# Patient Record
Sex: Female | Born: 1950
Health system: Southern US, Community
[De-identification: ages and names within clinical notes are randomized; demographics above are authoritative.]

## PROBLEM LIST (undated history)

## (undated) DIAGNOSIS — R87619 Unspecified abnormal cytological findings in specimens from cervix uteri: Secondary | ICD-10-CM

## (undated) DIAGNOSIS — R011 Cardiac murmur, unspecified: Secondary | ICD-10-CM

## (undated) DIAGNOSIS — M25552 Pain in left hip: Secondary | ICD-10-CM

## (undated) DIAGNOSIS — K219 Gastro-esophageal reflux disease without esophagitis: Secondary | ICD-10-CM

## (undated) DIAGNOSIS — E7800 Pure hypercholesterolemia, unspecified: Secondary | ICD-10-CM

## (undated) DIAGNOSIS — E079 Disorder of thyroid, unspecified: Secondary | ICD-10-CM

## (undated) DIAGNOSIS — B009 Herpesviral infection, unspecified: Secondary | ICD-10-CM

## (undated) DIAGNOSIS — G43909 Migraine, unspecified, not intractable, without status migrainosus: Secondary | ICD-10-CM

## (undated) DIAGNOSIS — G894 Chronic pain syndrome: Secondary | ICD-10-CM

## (undated) DIAGNOSIS — I493 Ventricular premature depolarization: Secondary | ICD-10-CM

## (undated) DIAGNOSIS — IMO0002 Reserved for concepts with insufficient information to code with codable children: Secondary | ICD-10-CM

## (undated) DIAGNOSIS — M810 Age-related osteoporosis without current pathological fracture: Secondary | ICD-10-CM

## (undated) DIAGNOSIS — M8580 Other specified disorders of bone density and structure, unspecified site: Secondary | ICD-10-CM

## (undated) HISTORY — PX: APPENDECTOMY: SHX54

## (undated) HISTORY — DX: Pure hypercholesterolemia, unspecified: E78.00

## (undated) HISTORY — DX: Unspecified abnormal cytological findings in specimens from cervix uteri: R87.619

## (undated) HISTORY — DX: Gastro-esophageal reflux disease without esophagitis: K21.9

## (undated) HISTORY — DX: Age-related osteoporosis without current pathological fracture: M81.0

## (undated) HISTORY — PX: FOOT SURGERY: SHX648

## (undated) HISTORY — DX: Reserved for concepts with insufficient information to code with codable children: IMO0002

## (undated) HISTORY — DX: Herpesviral infection, unspecified: B00.9

## (undated) HISTORY — DX: Migraine, unspecified, not intractable, without status migrainosus: G43.909

## (undated) HISTORY — DX: Disorder of thyroid, unspecified: E07.9

## (undated) HISTORY — DX: Ventricular premature depolarization: I49.3

## (undated) HISTORY — DX: Chronic pain syndrome: G89.4

## (undated) HISTORY — DX: Other specified disorders of bone density and structure, unspecified site: M85.80

## (undated) HISTORY — DX: Pain in left hip: M25.552

## (undated) HISTORY — PX: WRIST FRACTURE SURGERY: SHX121

## (undated) HISTORY — DX: Cardiac murmur, unspecified: R01.1

---

## 1997-10-11 ENCOUNTER — Other Ambulatory Visit: Admission: RE | Admit: 1997-10-11 | Discharge: 1997-10-11 | Payer: Self-pay | Admitting: Obstetrics and Gynecology

## 1998-12-12 ENCOUNTER — Other Ambulatory Visit: Admission: RE | Admit: 1998-12-12 | Discharge: 1998-12-12 | Payer: Self-pay | Admitting: Family Medicine

## 2003-05-17 ENCOUNTER — Other Ambulatory Visit: Admission: RE | Admit: 2003-05-17 | Discharge: 2003-05-17 | Payer: Self-pay | Admitting: Obstetrics and Gynecology

## 2004-08-15 ENCOUNTER — Other Ambulatory Visit: Admission: RE | Admit: 2004-08-15 | Discharge: 2004-08-15 | Payer: Self-pay | Admitting: Obstetrics and Gynecology

## 2012-04-08 ENCOUNTER — Ambulatory Visit: Payer: BC Managed Care – PPO | Admitting: Obstetrics and Gynecology

## 2012-04-08 ENCOUNTER — Encounter: Payer: Self-pay | Admitting: Obstetrics and Gynecology

## 2012-04-08 VITALS — BP 112/74 | Ht 64.0 in | Wt 116.0 lb

## 2012-04-08 DIAGNOSIS — Z124 Encounter for screening for malignant neoplasm of cervix: Secondary | ICD-10-CM

## 2012-04-08 DIAGNOSIS — Z01419 Encounter for gynecological examination (general) (routine) without abnormal findings: Secondary | ICD-10-CM

## 2012-04-08 DIAGNOSIS — A6000 Herpesviral infection of urogenital system, unspecified: Secondary | ICD-10-CM

## 2012-04-08 DIAGNOSIS — N952 Postmenopausal atrophic vaginitis: Secondary | ICD-10-CM

## 2012-04-08 MED ORDER — ESTRADIOL 0.1 MG/GM VA CREA: TOPICAL_CREAM | VAGINAL | Status: DC

## 2012-04-08 NOTE — Patient Instructions (Addendum)
Patient to ask physician about Hep B, HIV, and CBC. OTC med for skin itching: Caladryl

## 2012-04-08 NOTE — Progress Notes (Signed)
Last Pap: 2 years ago  WNL: Yes Regular Periods:no Contraception: PM   Monthly Breast exam:yes Tetanus<28yrs:yes Nl.Bladder Function:yes Daily BMs:yes Healthy Diet:yes Calcium:yes Mammogram:yes Date of Mammogram: fall 2013 Exercise:yes Have often Exercise: walk about a mile a day.  Seatbelt: yes Abuse at home: no Stressful work:no Sigmoid-colonoscopy: 4+ years ago per pt wnl  Bone Density: Yes 5+ years per pt  PCP: Dr.Bozeman C/o of herpes outbreaks .   Subjective:    Kristine Stafford is a 62 y.o. female G0P0 who presents for annual exam. Has hx of vulvar "outbreak" more than a month ago, then noted pruritic eruption over left lower abdomen over about 10 cm area  The following portions of the patient's history were reviewed and updated as appropriate: allergies, current medications, past family history, past medical history, past social history, past surgical history and problem list.  Review of Systems Pertinent items are noted in HPI. Gastrointestinal:No change in bowel habits, no abdominal pain, no rectal bleeding Genitourinary:negative for dysuria, frequency, hematuria, nocturia and urinary incontinence    Objective:     BP 112/74  Ht 5\' 4"  (1.626 m)  Wt 116 lb (52.617 kg)  BMI 19.9 kg/m2  Weight:  Wt Readings from Last 1 Encounters:  04/08/12 116 lb (52.617 kg)     BMI: Body mass index is 19.9 kg/(m^2). General Appearance: Alert, appropriate appearance for age. No acute distress HEENT: Grossly normal Neck / Thyroid: Supple, no masses, nodes or enlargement Lungs: clear to auscultation bilaterally Back: No CVA tenderness Breast Exam: No masses or nodes.No dimpling, nipple retraction or discharge. Cardiovascular: Regular rate and rhythm. S1, S2, no murmur Gastrointestinal: Soft, non-tender, no masses or organomegaly.  Erythematous patches of slightly raised non vesicular eruptions in various stages of healing after apparent scratching Pelvic Exam: External  genitalia: 8mm area on left posterior vulva at 5 o'clock position with minimal pigment change that pt notes as area of HSV outbreak Vaginal: atrophic mucosa Cervix: normal appearance and atrophic mucosa Adnexa: no masses noted Uterus: normal single, nontender Rectovaginal: normal rectal, no masses Lymphatic Exam: Non-palpable nodes in neck, clavicular, axillary, or inguinal regions Skin: no rash or abnormalities Neurologic: Normal gait and speech, no tremor  Psychiatric: Alert and oriented, appropriate affect.    Urinalysis:Not done    Assessment:    hx HSV  Abdominal eruption, doubt HSV, more consistent with dermatitis Atrophic vaginal sx Dyspareunia   Plan:  We had a prolonged discussion about these complex clinical issues and went over the various important aspects to consider. All questions were answered. Pt decided she would take the recommendation of continuing the higher dose Valtrex she is currently using for 2 weeks.  If eruption continues then, will see dermatologist. Estradiol vaginal cream mammogram pap smear Return 6wks

## 2012-04-09 LAB — PAP IG AND HPV HIGH-RISK: HPV DNA High Risk: NOT DETECTED

## 2012-12-17 ENCOUNTER — Other Ambulatory Visit: Payer: Self-pay

## 2013-11-26 ENCOUNTER — Other Ambulatory Visit: Payer: Self-pay

## 2014-02-21 ENCOUNTER — Ambulatory Visit (INDEPENDENT_AMBULATORY_CARE_PROVIDER_SITE_OTHER): Payer: Medicare Other | Admitting: Internal Medicine

## 2014-02-21 VITALS — BP 122/70 | HR 85 | Temp 98.5°F | Resp 18 | Ht 64.0 in | Wt 118.0 lb

## 2014-02-21 DIAGNOSIS — Z77018 Contact with and (suspected) exposure to other hazardous metals: Secondary | ICD-10-CM

## 2014-02-21 NOTE — Progress Notes (Signed)
Subjective:  This chart was scribed for Leandrew Koyanagi, MD by Ladene Artist, ED Scribe. The patient was seen in room 1. Patient's care was started at 9:52 AM.   Patient ID: Kristine Stafford, female    DOB: Oct 25, 1950, 64 y.o.   MRN: [id number]  Chief Complaint  Patient presents with  . Advice Only    pcp   HPI HPI Comments: Kristine Stafford is a 64 y.o. female, with a h/o HSV 2, cervical osteopenia, osteoporosis, thyroid disease, GERD, who presents to the [facility] for a consultation. Pt states that she wishes to establish care with Dr. Laney Pastor at this time. Pt states that she plans to have a DSMA test via [facility] within the next few weeks to see how much Mercury she has in her body(dental amalgam expos)in prep for filling removals. She denies any other symptoms at this time. Has worked with [employer] in past.  H camp-sis//[employer]  Past Medical History  Diagnosis Date  . Abnormal Pap smear   . Herpes simplex without mention of complication     HSV 2   . Osteopenia     in neck  . PVC (premature ventricular contraction)   . Migraine   . GERD (gastroesophageal reflux disease)   . Heart murmur   . Osteoporosis   . Thyroid disease    Current Outpatient Prescriptions on File Prior to Visit  Medication Sig Dispense Refill  . Ascorbic Acid (VITAMIN C) 1000 MG tablet Take 1,000 mg by mouth 3 (three) times daily.    Marland Kitchen estradiol (ESTRACE) 0.1 MG/GM vaginal cream 1 gm per vagina 2 times weekly for 4 weeks, then once weekly (Patient not taking: Reported on 02/21/2014) 42.5 g 12  . LYSINE PO Take by mouth.    . Multiple Vitamin (MULTIVITAMIN) capsule Take 1 capsule by mouth daily.    . Throat Lozenges (IMMUPLEX LOZENGE MT) Use as directed in the mouth or throat.    . valACYclovir (VALTREX) 500 MG tablet Take 500 mg by mouth 2 (two) times daily.    . vitamin E 200 UNIT capsule Take 200 Units by mouth daily.     No current facility-administered medications  on file prior to visit.   Allergies  Allergen Reactions  . Eggs Or Egg-Derived Products   . Other     Oats,poppyseeds,yeast,baker and brewers.   . Penicillins   . Tetracyclines & Related    Review of Systems  Constitutional: Negative for fever, chills and fatigue.  Respiratory: Negative for cough and shortness of breath.   Cardiovascular: Negative for chest pain.  Gastrointestinal: Negative for abdominal pain.  Neurological: Negative for dizziness, syncope, weakness and headaches.      Objective:   Physical Exam  Constitutional: She is oriented to person, place, and time. She appears well-developed and well-nourished. No distress.  HENT:  Head: Normocephalic and atraumatic.  Eyes: Conjunctivae and EOM are normal.  Cardiovascular: Normal rate.   Pulmonary/Chest: Effort normal.  Musculoskeletal: Normal range of motion.  Neurological: She is alert and oriented to person, place, and time.  Skin: Skin is warm and dry.  Psychiatric: She has a normal mood and affect. Her behavior is normal.  Nursing note and vitals reviewed. BP 122/70 mmHg  Pulse 85  Temp(Src) 98.5 F (36.9 C) (Oral)  Resp 18  Ht 5\' 4"  (1.626 m)  Wt 118 lb (53.524 kg)  BMI 20.24 kg/m2  SpO2 96%    Assessment & Plan:  I have completed the patient encounter in its entirety as documented by the scribe, with editing by me where necessary. Robert P. Laney Pastor, M.D.  Exposure to mercury  F/u mychart Med init visit by appt

## 2014-03-23 ENCOUNTER — Encounter: Payer: Self-pay | Admitting: Internal Medicine

## 2014-07-08 ENCOUNTER — Encounter: Payer: Self-pay | Admitting: Internal Medicine

## 2014-07-12 ENCOUNTER — Encounter: Payer: Self-pay | Admitting: Internal Medicine

## 2014-07-20 ENCOUNTER — Encounter: Payer: Self-pay | Admitting: Internal Medicine

## 2014-07-26 ENCOUNTER — Encounter: Payer: Self-pay | Admitting: Internal Medicine

## 2014-08-08 ENCOUNTER — Other Ambulatory Visit: Payer: Self-pay

## 2015-02-02 ENCOUNTER — Ambulatory Visit: Payer: Medicare Other | Admitting: Physician Assistant

## 2015-09-08 ENCOUNTER — Ambulatory Visit (INDEPENDENT_AMBULATORY_CARE_PROVIDER_SITE_OTHER): Payer: Medicare Other | Admitting: Family Medicine

## 2015-09-08 DIAGNOSIS — S80861A Insect bite (nonvenomous), right lower leg, initial encounter: Secondary | ICD-10-CM

## 2015-09-08 DIAGNOSIS — W57XXXA Bitten or stung by nonvenomous insect and other nonvenomous arthropods, initial encounter: Secondary | ICD-10-CM | POA: Insufficient documentation

## 2015-09-08 DIAGNOSIS — S80862A Insect bite (nonvenomous), left lower leg, initial encounter: Secondary | ICD-10-CM | POA: Diagnosis not present

## 2015-09-08 MED ORDER — TRIAMCINOLONE ACETONIDE 0.1 % EX CREA: 1.0000 "application " | TOPICAL_CREAM | Freq: Two times a day (BID) | CUTANEOUS | 0 refills | Status: DC

## 2015-09-08 MED ORDER — DOXYCYCLINE HYCLATE 100 MG PO TABS: 100.0000 mg | ORAL_TABLET | Freq: Two times a day (BID) | ORAL | 0 refills | Status: AC

## 2015-09-08 NOTE — Patient Instructions (Addendum)
Thank you for coming in today. Take doxycyline twice daily for 1 week.  Do not take with calcium, iron or a multi-vitamin.  Use the cream as needed.  Return as needed.  Doxycycline can make it easier to get a sunburn so cover up and use sun-screen.  If you get allergic reaction to doxycyline stop it.    Tick Bite Information Ticks are insects that attach themselves to the skin and draw blood for food. There are various types of ticks. Common types include wood ticks and deer ticks. Most ticks live in shrubs and grassy areas. Ticks can climb onto your body when you make contact with leaves or grass where the tick is waiting. The most common places on the body for ticks to attach themselves are the scalp, neck, armpits, waist, and groin. Most tick bites are harmless, but sometimes ticks carry germs that cause diseases. These germs can be spread to a person during the tick's feeding process. The chance of a disease spreading through a tick bite depends on:   The type of tick.  Time of year.   How long the tick is attached.   Geographic location.  HOW CAN YOU PREVENT TICK BITES? Take these steps to help prevent tick bites when you are outdoors:  Wear protective clothing. Long sleeves and long pants are best.   Wear white clothes so you can see ticks more easily.  Tuck your pant legs into your socks.   If walking on a trail, stay in the middle of the trail to avoid brushing against bushes.  Avoid walking through areas with long grass.  Put insect repellent on all exposed skin and along boot tops, pant legs, and sleeve cuffs.   Check clothing, hair, and skin repeatedly and before going inside.   Brush off any ticks that are not attached.  Take a shower or bath as soon as possible after being outdoors.  WHAT IS THE PROPER WAY TO REMOVE A TICK? Ticks should be removed as soon as possible to help prevent diseases caused by tick bites. 1. If latex gloves are available, put  them on before trying to remove a tick.  2. Using fine-point tweezers, grasp the tick as close to the skin as possible. You may also use curved forceps or a tick removal tool. Grasp the tick as close to its head as possible. Avoid grasping the tick on its body. 3. Pull gently with steady upward pressure until the tick lets go. Do not twist the tick or jerk it suddenly. This may break off the tick's head or mouth parts. 4. Do not squeeze or crush the tick's body. This could force disease-carrying fluids from the tick into your body.  5. After the tick is removed, wash the bite area and your hands with soap and water or other disinfectant such as alcohol. 6. Apply a small amount of antiseptic cream or ointment to the bite site.  7. Wash and disinfect any instruments that were used.  Do not try to remove a tick by applying a hot match, petroleum jelly, or fingernail polish to the tick. These methods do not work and may increase the chances of disease being spread from the tick bite.  WHEN SHOULD YOU SEEK MEDICAL CARE? Contact your health care provider if you are unable to remove a tick from your skin or if a part of the tick breaks off and is stuck in the skin.  After a tick bite, you need to be aware  of signs and symptoms that could be related to diseases spread by ticks. Contact your health care provider if you develop any of the following in the days or weeks after the tick bite:  Unexplained fever.  Rash. A circular rash that appears days or weeks after the tick bite may indicate the possibility of Lyme disease. The rash may resemble a target with a bull's-eye and may occur at a different part of your body than the tick bite.  Redness and swelling in the area of the tick bite.   Tender, swollen lymph glands.   Diarrhea.   Weight loss.   Cough.   Fatigue.   Muscle, joint, or bone pain.   Abdominal pain.   Headache.   Lethargy or a change in your level of  consciousness.  Difficulty walking or moving your legs.   Numbness in the legs.   Paralysis.  Shortness of breath.   Confusion.   Repeated vomiting.    This information is not intended to replace advice given to you by your health care provider. Make sure you discuss any questions you have with your health care provider.   Document Released: 01/26/2000 Document Revised: 02/18/2014 Document Reviewed: 07/08/2012 Elsevier Interactive Patient Education 2016 [employer].     IF you received an x-ray today, you will receive an invoice from [facility]. Please contact [facility] at [phone] with questions or concerns regarding your invoice.   IF you received labwork today, you will receive an invoice from [employer]. Please contact [facility] at [phone] with questions or concerns regarding your invoice.   Our billing staff will not be able to assist you with questions regarding bills from these companies.  You will be contacted with the lab results as soon as they are available. The fastest way to get your results is to activate your My Chart account. Instructions are located on the last page of this paperwork. If you have not heard from [place] regarding the results in 2 weeks, please contact this office.     We recommend that you schedule a mammogram for breast cancer screening. Typically, you do not need a referral to do this. Please contact a local imaging center to schedule your mammogram.  [facility] - [phone]  *ask for the Radiology Department [facility]) - [phone] or [phone]  [facility] - [phone] [place] [phone] [facility] - [phone]  *ask for the [facility] - [phone]  *ask for the Radiology Department [facility] - [phone]  *ask for the [facility] - [phone]

## 2015-09-08 NOTE — Progress Notes (Signed)
    Kristine Stafford is a 65 y.o. female who presents to [facility] today for multiple tick bites. Patient was bitten by multiple small ticks few days ago. There were tiny almost microscopic dots. She pulled many of her legs. She notes multiple erythematous papules. She estimates the ticks were on her skin for about 12 hours.   She notes a drug allergy to tetracycline. She describes nausea. She notes she's been able to take doxycycline in the past.   Past Medical History:  Diagnosis Date  . Abnormal Pap smear   . GERD (gastroesophageal reflux disease)   . Heart murmur   . Herpes simplex without mention of complication    HSV 2   . Migraine   . Osteopenia    in neck  . Osteoporosis   . PVC (premature ventricular contraction)   . Thyroid disease    Past Surgical History:  Procedure Laterality Date  . APPENDECTOMY     Social History  Substance Use Topics  . Smoking status: Never Smoker  . Smokeless tobacco: Never Used  . Alcohol use Yes   ROS as above Medications: Current Outpatient Prescriptions  Medication Sig Dispense Refill  . Ascorbic Acid (VITAMIN C) 1000 MG tablet Take 1,000 mg by mouth 3 (three) times daily.    Marland Kitchen LYSINE PO Take by mouth.    . Multiple Vitamin (MULTIVITAMIN) capsule Take 1 capsule by mouth daily.    . vitamin E 200 UNIT capsule Take 200 Units by mouth daily.    Marland Kitchen doxycycline (VIBRA-TABS) 100 MG tablet Take 1 tablet (100 mg total) by mouth 2 (two) times daily. 14 tablet 0  . triamcinolone cream (KENALOG) 0.1 % Apply 1 application topically 2 (two) times daily. 453.6 g 0   No current facility-administered medications for this visit.    Allergies  Allergen Reactions  . Eggs Or Egg-Derived Products   . Other     Oats,poppyseeds,yeast,baker and brewers.   . Penicillins   . Tetracyclines & Related      Exam:  BP 118/72 (BP Location: Left Arm, Patient Position: Sitting, Cuff Size: Small)   Pulse 86   Temp 98.1 F (36.7 C) (Oral)   Resp 16   Ht 5'  4" (1.626 m)   Wt 119 lb 3.2 oz (54.1 kg)   SpO2 98%   BMI 20.46 kg/m  Gen: Well NAD Skin: Multiple small erythematous papules on lower extremities. No visible embedded ticks   No results found for this or any previous visit (from the past 24 hour(s)). No results found.  Assessment and Plan: 66 y.o. female with multiple tick bites. Treat empirically with doxycycline and triamcinolone cream. I feel safe to use doxycycline in the setting of a reported tetracycline allergy is a believe the allergy was a side effect to tetracycline and not a true allergy. We had a discussion about warning signs and symptoms of drug allergies.  Return as needed.  Discussed warning signs or symptoms. Please see discharge instructions. Patient expresses understanding.

## 2017-10-08 ENCOUNTER — Other Ambulatory Visit: Payer: Self-pay | Admitting: Family Medicine

## 2017-10-08 DIAGNOSIS — R5381 Other malaise: Secondary | ICD-10-CM

## 2017-10-24 ENCOUNTER — Other Ambulatory Visit: Payer: Self-pay | Admitting: Family Medicine

## 2017-10-24 DIAGNOSIS — E2839 Other primary ovarian failure: Secondary | ICD-10-CM

## 2017-12-25 ENCOUNTER — Ambulatory Visit
Admission: RE | Admit: 2017-12-25 | Discharge: 2017-12-25 | Disposition: A | Discharge status: Home / Self Care | Payer: Medicare Other | Source: Ambulatory Visit | Attending: Family Medicine | Admitting: Family Medicine

## 2017-12-25 DIAGNOSIS — E2839 Other primary ovarian failure: Secondary | ICD-10-CM

## 2020-06-05 DIAGNOSIS — Z1159 Encounter for screening for other viral diseases: Secondary | ICD-10-CM | POA: Diagnosis not present

## 2020-06-05 DIAGNOSIS — M810 Age-related osteoporosis without current pathological fracture: Secondary | ICD-10-CM | POA: Diagnosis not present

## 2020-06-05 DIAGNOSIS — Z13220 Encounter for screening for lipoid disorders: Secondary | ICD-10-CM | POA: Diagnosis not present

## 2020-06-05 DIAGNOSIS — R413 Other amnesia: Secondary | ICD-10-CM | POA: Diagnosis not present

## 2020-06-05 DIAGNOSIS — Z0000 Encounter for general adult medical examination without abnormal findings: Secondary | ICD-10-CM | POA: Diagnosis not present

## 2020-06-05 DIAGNOSIS — Z131 Encounter for screening for diabetes mellitus: Secondary | ICD-10-CM | POA: Diagnosis not present

## 2020-06-05 DIAGNOSIS — Z23 Encounter for immunization: Secondary | ICD-10-CM | POA: Diagnosis not present

## 2020-06-05 DIAGNOSIS — Z1389 Encounter for screening for other disorder: Secondary | ICD-10-CM | POA: Diagnosis not present

## 2020-06-05 DIAGNOSIS — Z1211 Encounter for screening for malignant neoplasm of colon: Secondary | ICD-10-CM | POA: Diagnosis not present

## 2020-06-05 DIAGNOSIS — Z136 Encounter for screening for cardiovascular disorders: Secondary | ICD-10-CM | POA: Diagnosis not present

## 2020-06-13 ENCOUNTER — Encounter: Payer: Self-pay | Admitting: Neurology

## 2020-07-24 DIAGNOSIS — H25813 Combined forms of age-related cataract, bilateral: Secondary | ICD-10-CM | POA: Diagnosis not present

## 2020-07-24 DIAGNOSIS — H04123 Dry eye syndrome of bilateral lacrimal glands: Secondary | ICD-10-CM | POA: Diagnosis not present

## 2020-07-24 DIAGNOSIS — H353122 Nonexudative age-related macular degeneration, left eye, intermediate dry stage: Secondary | ICD-10-CM | POA: Diagnosis not present

## 2020-07-24 DIAGNOSIS — H353111 Nonexudative age-related macular degeneration, right eye, early dry stage: Secondary | ICD-10-CM | POA: Diagnosis not present

## 2020-08-11 DIAGNOSIS — Z780 Asymptomatic menopausal state: Secondary | ICD-10-CM | POA: Diagnosis not present

## 2020-08-11 DIAGNOSIS — Z1231 Encounter for screening mammogram for malignant neoplasm of breast: Secondary | ICD-10-CM | POA: Diagnosis not present

## 2020-08-11 DIAGNOSIS — M810 Age-related osteoporosis without current pathological fracture: Secondary | ICD-10-CM | POA: Diagnosis not present

## 2020-09-05 ENCOUNTER — Other Ambulatory Visit (INDEPENDENT_AMBULATORY_CARE_PROVIDER_SITE_OTHER): Payer: Medicare PPO

## 2020-09-05 ENCOUNTER — Other Ambulatory Visit: Payer: Self-pay

## 2020-09-05 ENCOUNTER — Encounter: Payer: Self-pay | Admitting: Physician Assistant

## 2020-09-05 ENCOUNTER — Ambulatory Visit: Payer: Medicare PPO | Admitting: Physician Assistant

## 2020-09-05 VITALS — BP 115/76 | HR 91 | Ht 64.0 in | Wt 120.2 lb

## 2020-09-05 DIAGNOSIS — R413 Other amnesia: Secondary | ICD-10-CM

## 2020-09-05 LAB — VITAMIN B12: Vitamin B-12: 228 pg/mL (ref 211–911)

## 2020-09-05 LAB — TSH: TSH: 3.44 u[IU]/mL (ref 0.35–5.50)

## 2020-09-05 NOTE — Progress Notes (Signed)
Assessment/Plan:   Kristine Stafford is a 70 y.o. year old female with risk factors including migraines, hypothyroidism, hyperlipidemia, hypertension seen today for evaluation of memory loss. MoCA today is 15/30 = MMSE  with deficiencies in memory, language, abstraction, delayed recall  0/5, orientation  4/6    Recommendations:   Memory Loss   MRI brain with/without contrast to assess for underlying structural abnormality and assess vascular load  Neurocognitive testing to further evaluate cognitive concerns and determine underlying cause of memory changes, including potential contribution from sleep, anxiety, or depression  Check B12, TSH Discussed safety both in and out of the home.  Discussed the importance of regular daily schedule with inclusion of crossword puzzles to maintain brain function.  Continue to monitor mood with PCP.  Stay active at least 30 minutes at least 3 times a week.  Naps should be scheduled and should be no longer than 60 minutes and should not occur after 2 PM.  Mediterranean diet is recommended  Folllow up once results above are available   Subjective:    The patient is seen in neurologic consultation at the request of Donald Prose, MD for the evaluation of memory.  The patient is accompanied by boyfriend [name] who supplements the history. She is a 70 y.o. year old female who has had memory issues for about  1 year where she has noticed worsening short term recall issues. [name] reports that she is increasingly asking the same questions and repeating the same stories. " She cannot retain new information". She lives with [name] for the last 40 years. He noticed increased hoarding old objects and she adds "this is clogging my life". "She appears more stressed than depressed and Covid pandemic adds to it "-he says. Denies irritability. Her sleep pattern is "erratic", goes to sleep late and wakes up during the night, but she is trying to change her schedule to go to sleep  at the same time. Denies acting out in dreams or sleepwalking. Denies hallucinations or paranoia. She is independent of dressing and bathing. [name] manages her medications because she has a tendency to forget. He also manages the bill because she has missed some payments and he does the driving after she could not remember how to get from the pharmacy to the grocery store. A few months back, she made a trip to [place] and took the wrong turn, so she voluntarily surrendered the driving. She cooks occasionally, denies leaving the stove or faucet on. Appetite is good and denies trouble swallowing.  Denies leaving objects in unusual places. Ambulates without difficulty without a cane or walker.  Denies headaches or  falls. She had injury to the head at home, with a folding table about 1 y ago, without LOC. No visit to the ER or imaging was reported. Denies double vision, dizziness, focal numbness or tingling, unilateral weakness or tremors. Denies urine incontinence or retention. Denies constipation or diarrhea.  Denies anosmia. Denies history of OSA, ETOH  or Tobacco. Family History Dad dementia 90s AD. College education. Former Licensed conveyancer.      Allergies  Allergen Reactions   Eggs Or Egg-Derived Products    Other     Oats,poppyseeds,yeast,baker and brewers.    Penicillins    Tetracyclines & Related     Current Outpatient Medications  Medication Instructions   alendronate (FOSAMAX) 70 mg, Oral, Weekly, Take with a full glass of water on an empty stomach.   calcium citrate-vitamin D (CITRACAL+D) 315-200 MG-UNIT tablet 1 tablet,  Oral, 2 times daily     VITALS:   Vitals:   09/05/20 1021  BP: 115/76  Pulse: 91  SpO2: 97%  Weight: 120 lb 3.2 oz (54.5 kg)  Height: '5\' 4"'$  (1.626 m)        PHYSICAL EXAM   HEENT:  Normocephalic, atraumatic. The mucous membranes are moist. The superficial temporal arteries are without ropiness or tenderness. Cardiovascular: Regular rate and rhythm. Lungs: Clear  to auscultation bilaterally. Neck: There are no carotid bruits noted bilaterally.  NEUROLOGICAL: Montreal Cognitive Assessment  09/05/2020  Visuospatial/ Executive (0/5) 3  Naming (0/3) 3  Attention: Read list of digits (0/2) 2  Attention: Read list of letters (0/1) 1  Attention: Serial 7 subtraction starting at 100 (0/3) 0  Language: Repeat phrase (0/2) 0  Language : Fluency (0/1) 1  Abstraction (0/2) 1  Delayed Recall (0/5) 0  Orientation (0/6) 4  Total 15  Adjusted Score (based on education) 15   No flowsheet data found.  No flowsheet data found.   Orientation:  Alert and oriented to person, place , not to time. No aphasia or dysarthria. Fund of knowledge is appropriate. Recent memory impaired and remote memory intact.  Attention and concentration are normal.  Able to name objects and repeat phrases. Delayed recall 0  /5 Cranial nerves: There is good facial symmetry. Extraocular muscles are intact and visual fields are full to confrontational testing. Speech is fluent and clear. Soft palate rises symmetrically and there is no tongue deviation. Hearing is intact to conversational tone. Tone: Tone is good throughout. Sensation: Sensation is intact to light touch and pinprick throughout. Vibration is intact at the bilateral big toe.There is no extinction with double simultaneous stimulation. There is no sensory dermatomal level identified. Coordination: The patient has no difficulty with RAM's or FNF bilaterally. Normal finger to nose  Motor: Strength is 5/5 in the bilateral upper and lower extremities. There is no pronator drift. There are no fasciculations noted. DTR's: Deep tendon reflexes are 2/4 at the bilateral biceps, triceps, brachioradialis, patella and achilles.  Plantar responses are downgoing bilaterally. Gait and Station: The patient is able to ambulate without difficulty.The patient is able to heel toe walk without any difficulty.The patient is able to ambulate in a tandem  fashion. The patient is able to stand in the Romberg position.     Thank you for allowing [place] the opportunity to participate in the care of this nice patient. Please do not hesitate to contact us for any questions or concerns.   Total time spent on today's visit was 60 minutes, including both face-to-face time and nonface-to-face time.  Time included that spent on review of records (prior notes available to me/labs/imaging if pertinent), discussing treatment and goals, answering patient's questions and coordinating care.  Cc:  Donald Prose, MD  Sharene Butters 09/05/2020 12:37 PM

## 2020-09-05 NOTE — Patient Instructions (Signed)
It was a pleasure to see you today at our office.   Recommendations:  Neurocognitive evaluation at our office MRI of the brain, the office will call you to arrange you appointment Check B12 and TSH at the lab Follow up once the results of the above are available   RECOMMENDATIONS FOR ALL PATIENTS WITH MEMORY PROBLEMS: 1. Continue to exercise (Recommend 30 minutes of walking everyday, or 3 hours every week) 2. Increase social interactions - continue going to [place] and enjoy social gatherings with friends and family 3. Eat healthy, avoid fried foods and eat more fruits and vegetables 4. Maintain adequate blood pressure, blood sugar, and blood cholesterol level. Reducing the risk of stroke and cardiovascular disease also helps promoting better memory. 5. Avoid stressful situations. Live a simple life and avoid aggravations. Organize your time and prepare for the next day in anticipation. 6. Sleep well, avoid any interruptions of sleep and avoid any distractions in the bedroom that may interfere with adequate sleep quality 7. Avoid sugar, avoid sweets as there is a strong link between excessive sugar intake, diabetes, and cognitive impairment We discussed the Mediterranean diet, which has been shown to help patients reduce the risk of progressive memory disorders and reduces cardiovascular risk. This includes eating fish, eat fruits and green leafy vegetables, nuts like almonds and hazelnuts, walnuts, and also use olive oil. Avoid fast foods and fried foods as much as possible. Avoid sweets and sugar as sugar use has been linked to worsening of memory function.  There is always a concern of gradual progression of memory problems. If this is the case, then we may need to adjust level of care according to patient needs. Support, both to the patient and caregiver, should then be put into place.      You have been referred for a neuropsychological evaluation (i.e., evaluation of memory and thinking  abilities). Please bring someone with you to this appointment if possible, as it is helpful for the doctor to hear from both you and another adult who knows you well. Please bring eyeglasses and hearing aids if you wear them.    The evaluation will take approximately 3 hours and has two parts:   The first part is a clinical interview with the neuropsychologist (Dr. Melvyn Novas or Dr. Nicole Kindred). During the interview, the neuropsychologist will speak with you and the individual you brought to the appointment.    The second part of the evaluation is testing with the doctor's technician Hinton Dyer or Maudie Mercury). During the testing, the technician will ask you to remember different types of material, solve problems, and answer some questionnaires. Your family member will not be present for this portion of the evaluation.   Please note: We must reserve several hours of the neuropsychologist's time and the psychometrician's time for your evaluation appointment. As such, there is a No-Show fee of $100. If you are unable to attend any of your appointments, please contact our office as soon as possible to reschedule.    FALL PRECAUTIONS: Be cautious when walking. Scan the area for obstacles that may increase the risk of trips and falls. When getting up in the mornings, sit up at the edge of the bed for a few minutes before getting out of bed. Consider elevating the bed at the head end to avoid drop of blood pressure when getting up. Walk always in a well-lit room (use night lights in the walls). Avoid area rugs or power cords from appliances in the middle of the walkways.  Use a walker or a cane if necessary and consider physical therapy for balance exercise. Get your eyesight checked regularly.  FINANCIAL OVERSIGHT: Supervision, especially oversight when making financial decisions or transactions is also recommended.  HOME SAFETY: Consider the safety of the kitchen when operating appliances like stoves, microwave oven, and  blender. Consider having supervision and share cooking responsibilities until no longer able to participate in those. Accidents with firearms and other hazards in the house should be identified and addressed as well.   ABILITY TO BE LEFT ALONE: If patient is unable to contact 911 operator, consider using LifeLine, or when the need is there, arrange for someone to stay with patients. Smoking is a fire hazard, consider supervision or cessation. Risk of wandering should be assessed by caregiver and if detected at any point, supervision and safe proof recommendations should be instituted.  MEDICATION SUPERVISION: Inability to self-administer medication needs to be constantly addressed. Implement a mechanism to ensure safe administration of the medications.   DRIVING: Regarding driving, in patients with progressive memory problems, driving will be impaired. We advise to have someone else do the driving if trouble finding directions or if minor accidents are reported. Independent driving assessment is available to determine safety of driving.   If you are interested in the driving assessment, you can contact the following:  [employer] in [place]  [place] [phone] or [phone]    [place] refers to food and lifestyle choices that are based on the traditions of countries located on the [employer]. This way of eating has been shown to help prevent certain conditions and improve outcomes for people who have chronic diseases, like kidney disease and heart disease. What are tips for following this plan? Lifestyle  Cook and eat meals together with your family, when possible. Drink enough fluid to keep your urine clear or pale yellow. Be physically active every day. This includes: Aerobic exercise like running or swimming. Leisure  activities like gardening, walking, or housework. Get 7-8 hours of sleep each night. If recommended by your health care provider, drink red wine in moderation. This means 1 glass a day for nonpregnant women and 2 glasses a day for men. A glass of wine equals 5 oz (150 mL). Reading food labels  Check the serving size of packaged foods. For foods such as rice and pasta, the serving size refers to the amount of cooked product, not dry. Check the total fat in packaged foods. Avoid foods that have saturated fat or trans fats. Check the ingredients list for added sugars, such as corn syrup. Shopping  At the grocery store, buy most of your food from the areas near the walls of the store. This includes: Fresh fruits and vegetables (produce). Grains, beans, nuts, and seeds. Some of these may be available in unpackaged forms or large amounts (in bulk). Fresh seafood. Poultry and eggs. Low-fat dairy products. Buy whole ingredients instead of prepackaged foods. Buy fresh fruits and vegetables in-season from local farmers markets. Buy frozen fruits and vegetables in resealable bags. If you do not have access to quality fresh seafood, buy precooked frozen shrimp or canned fish, such as tuna, salmon, or sardines. Buy small amounts of raw or cooked vegetables, salads, or olives from the deli or salad bar at your store. Stock your pantry so you always have certain foods on hand, such as olive oil, canned tuna, canned tomatoes, rice,  pasta, and beans. Cooking  Cook foods with extra-virgin olive oil instead of using butter or other vegetable oils. Have meat as a side dish, and have vegetables or grains as your main dish. This means having meat in small portions or adding small amounts of meat to foods like pasta or stew. Use beans or vegetables instead of meat in common dishes like chili or lasagna. Experiment with different cooking methods. Try roasting or broiling vegetables instead of steaming or sauteing  them. Add frozen vegetables to soups, stews, pasta, or rice. Add nuts or seeds for added healthy fat at each meal. You can add these to yogurt, salads, or vegetable dishes. Marinate fish or vegetables using olive oil, lemon juice, garlic, and fresh herbs. Meal planning  Plan to eat 1 vegetarian meal one day each week. Try to work up to 2 vegetarian meals, if possible. Eat seafood 2 or more times a week. Have healthy snacks readily available, such as: Vegetable sticks with hummus. Greek yogurt. Fruit and nut trail mix. Eat balanced meals throughout the week. This includes: Fruit: 2-3 servings a day Vegetables: 4-5 servings a day Low-fat dairy: 2 servings a day Fish, poultry, or lean meat: 1 serving a day Beans and legumes: 2 or more servings a week Nuts and seeds: 1-2 servings a day Whole grains: 6-8 servings a day Extra-virgin olive oil: 3-4 servings a day Limit red meat and sweets to only a few servings a month What are my food choices? Mediterranean diet Recommended Grains: Whole-grain pasta. Brown rice. Bulgar wheat. Polenta. Couscous. Whole-wheat bread. [place]. Vegetables: Artichokes. Beets. Broccoli. Cabbage. Carrots. Eggplant. Green beans. Chard. Kale. Spinach. Onions. Leeks. Peas. Squash. Tomatoes. Peppers. Radishes. Fruits: Apples. Apricots. Avocado. Berries. Bananas. Cherries. Dates. Figs. Grapes. Lemons. Melon. Oranges. Peaches. Plums. Pomegranate. Meats and other protein foods: Beans. Almonds. Sunflower seeds. Pine nuts. Peanuts. [place]. Salmon. Scallops. Shrimp. [place]. Tilapia. Clams. Oysters. Eggs. Dairy: Low-fat milk. Cheese. Greek yogurt. Beverages: Water. Red wine. Herbal tea. Fats and oils: Extra virgin olive oil. Avocado oil. Grape seed oil. Sweets and desserts: [place] yogurt with honey. Baked apples. Poached pears. Trail mix. Seasoning and other foods: Basil. Cilantro. Coriander. Cumin. Mint. Parsley. Sage. Rosemary. Tarragon. Garlic. Oregano. Thyme. Pepper.  Balsalmic vinegar. Tahini. Hummus. Tomato sauce. Olives. Mushrooms. Limit these Grains: Prepackaged pasta or rice dishes. Prepackaged cereal with added sugar. Vegetables: Deep fried potatoes (french fries). Fruits: Fruit canned in syrup. Meats and other protein foods: Beef. Pork. Lamb. Poultry with skin. Hot dogs. Berniece Salines. Dairy: Ice cream. Sour cream. Whole milk. Beverages: Juice. Sugar-sweetened soft drinks. Beer. Liquor and spirits. Fats and oils: Butter. Canola oil. Vegetable oil. Beef fat (tallow). Lard. Sweets and desserts: Cookies. Cakes. Pies. Candy. Seasoning and other foods: Mayonnaise. Premade sauces and marinades. The items listed may not be a complete list. Talk with your dietitian about what dietary choices are right for you. Summary The Mediterranean diet includes both food and lifestyle choices. Eat a variety of fresh fruits and vegetables, beans, nuts, seeds, and whole grains. Limit the amount of red meat and sweets that you eat. Talk with your health care provider about whether it is safe for you to drink red wine in moderation. This means 1 glass a day for nonpregnant women and 2 glasses a day for men. A glass of wine equals 5 oz (150 mL). This information is not intended to replace advice given to you by your health care provider. Make sure you discuss any questions you have with your health care  provider. Document Released: 09/21/2015 Document Revised: 10/24/2015 Document Reviewed: 09/21/2015 Elsevier Interactive Patient Education  2017 [employer].

## 2020-09-06 ENCOUNTER — Telehealth: Payer: Self-pay

## 2020-09-06 NOTE — Telephone Encounter (Signed)
-----   Message from Rondel Jumbo, PA-C sent at 09/05/2020  2:58 PM EDT ----- Please inform pt that her thyroid lab is normal at 3.44 , and that her B12 is 228, we prefer the levels between 400 and 100. Recommend daily B12 , 1000 mcg a day to replenish. Thanks

## 2020-09-06 NOTE — Telephone Encounter (Signed)
Pt called no answer per DPR left a voice mail thyroid lab is normal at 3.44 , and that her B12 is 228, we prefer the levelsbetween 400 and 100. Recommend daily B12 , 1000 mcg a day to replenish

## 2020-09-20 ENCOUNTER — Ambulatory Visit
Admission: RE | Admit: 2020-09-20 | Discharge: 2020-09-20 | Disposition: A | Discharge status: Home / Self Care | Payer: Medicare PPO | Source: Ambulatory Visit | Attending: Physician Assistant | Admitting: Physician Assistant

## 2020-09-20 DIAGNOSIS — R413 Other amnesia: Secondary | ICD-10-CM | POA: Diagnosis not present

## 2020-09-20 IMAGING — MR MR HEAD W/O CM
10 series · 48 of 48 positions shown · non-contrast
Comparison: None.

CLINICAL DATA: Memory loss. Confusion and cognitive issues for 1
year

EXAM:
MRI HEAD WITHOUT CONTRAST
TECHNIQUE: Multiplanar, multiecho pulse sequences of the brain and surrounding
structures were obtained without intravenous contrast.

[Series 2: T1 · sagittal · 5.0mm · 0.45mm/px · 2 of 20 slices shown]
[im 1/20]
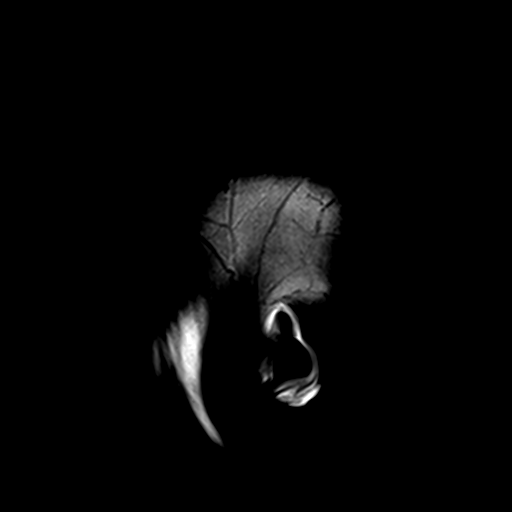
[im 20/20]
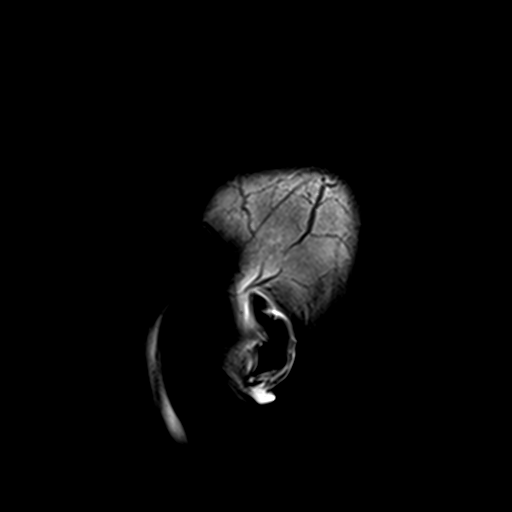

[Series 3: DWI · axial · 3.0mm · 1.80mm/px · z∈[-46,+99]mm · 9 of 98 slices shown (1 of 4)]
[im 1/98]
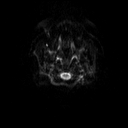
[im 13/98]
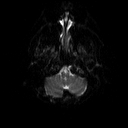
[im 25/98]
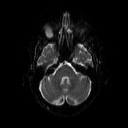
[im 37/98]
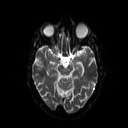
[im 49/98]
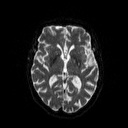
[im 61/98]
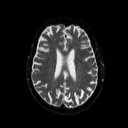
[im 73/98]
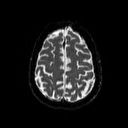
[im 85/98]
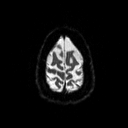
[im 98/98]
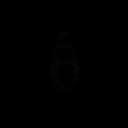

[Series 4: DWI · axial · 3.0mm · 1.80mm/px · z∈[-46,+99]mm · 4 of 48 slices shown (2 of 4)]
[im 1/48]
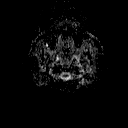
[im 16/48]
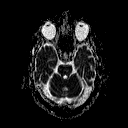
[im 32/48]
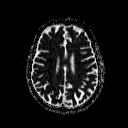
[im 48/48]
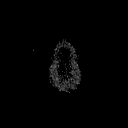

[Series 5: DWI · coronal · 5.0mm · 1.80mm/px · 6 of 72 slices shown (3 of 4)]
[im 1/72]
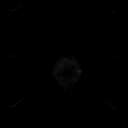
[im 15/72]
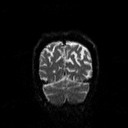
[im 29/72]
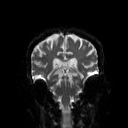
[im 43/72]
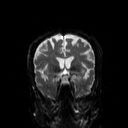
[im 57/72]
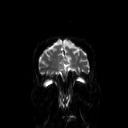
[im 72/72]
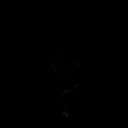

[Series 6: DWI · coronal · 5.0mm · 1.80mm/px · 3 of 34 slices shown (4 of 4)]
[im 1/34]
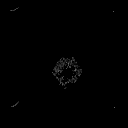
[im 17/34]
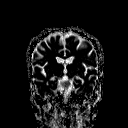
[im 34/34]
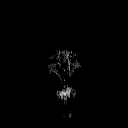

[Series 7: T2 · axial · 5.0mm · 0.60mm/px · z∈[-44,+102]mm · 2 of 22 slices shown (1 of 2)]
[im 1/22]
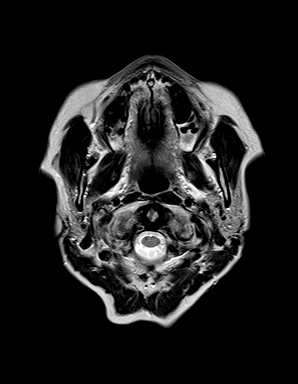
[im 22/22]
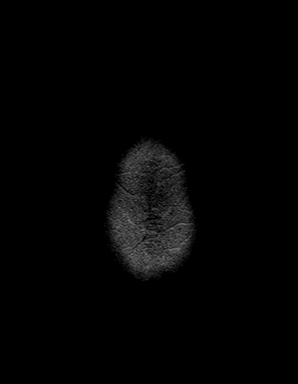

[Series 8: FLAIR · axial · 3.0mm · 0.45mm/px · z∈[-46,+101]mm · 3 of 33 slices shown]
[im 1/33]
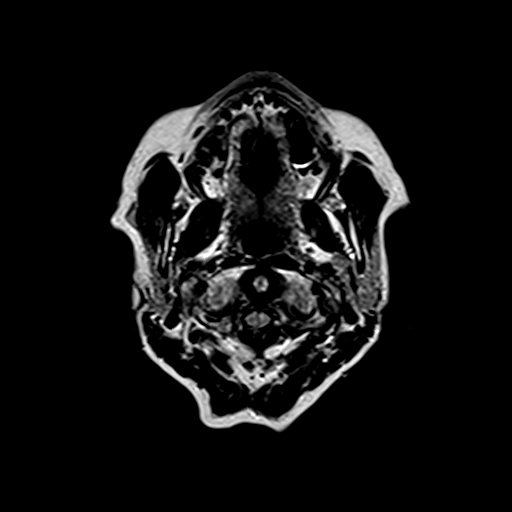
[im 17/33]
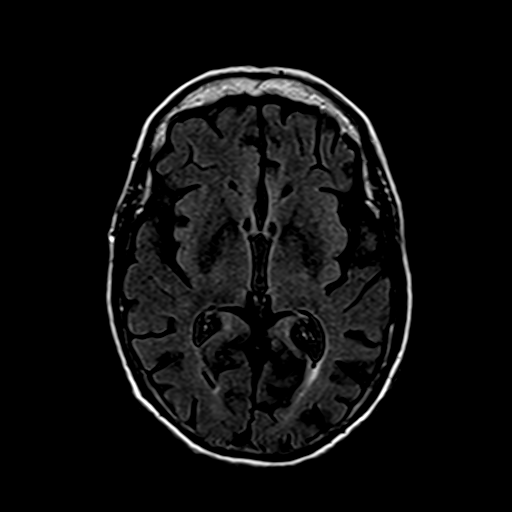
[im 33/33]
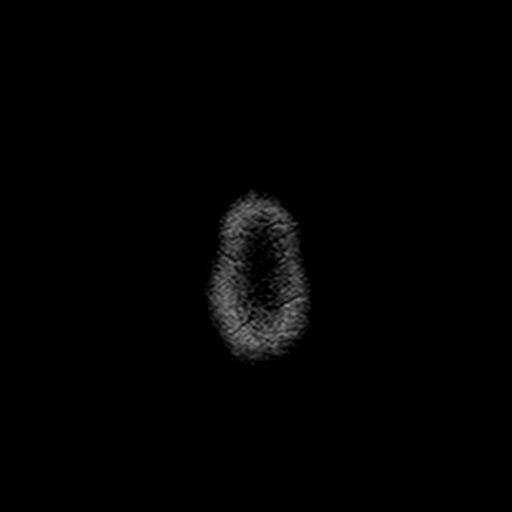

[Series 10: swi_images · axial · 4.0mm · 0.90mm/px · z∈[-49,+105]mm · 4 of 40 slices shown]
[im 1/40]
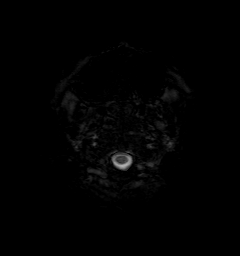
[im 14/40]
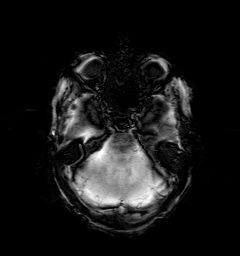
[im 27/40]
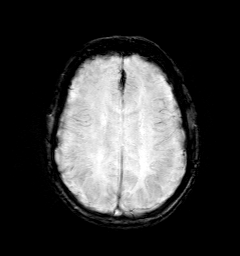
[im 40/40]
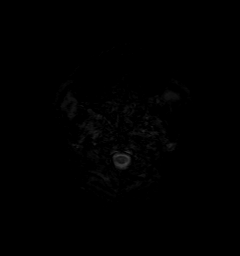

[Series 11: t1_mpr_tra · axial · 1.0mm · 0.71mm/px · z∈[-42,+100]mm · 13 of 144 slices shown]
[im 1/144]
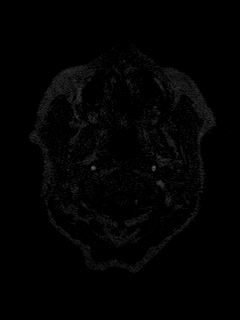
[im 12/144]
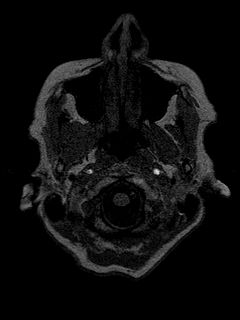
[im 24/144]
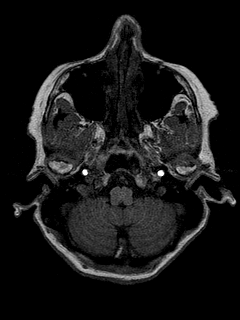
[im 36/144]
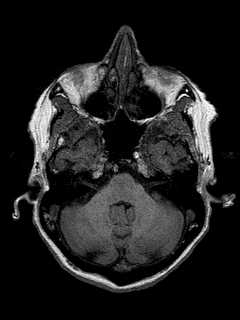
[im 48/144]
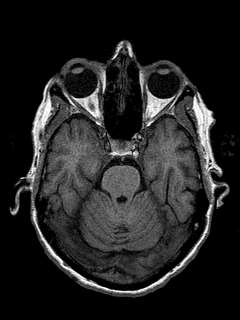
[im 60/144]
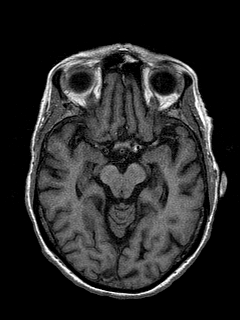
[im 72/144]
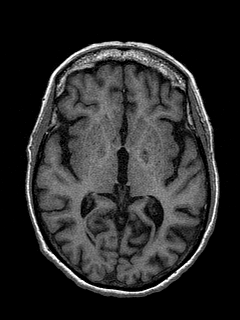
[im 84/144]
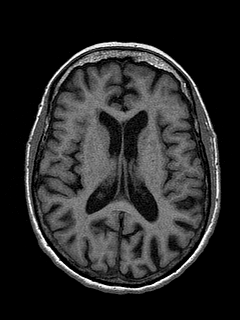
[im 96/144]
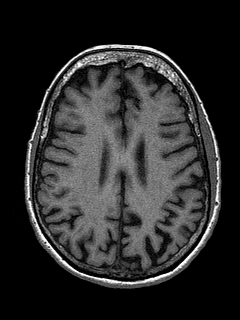
[im 108/144]
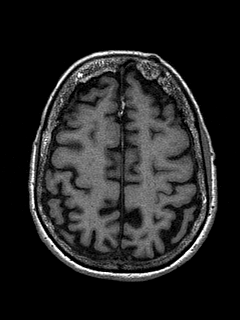
[im 120/144]
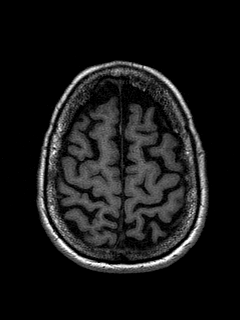
[im 132/144]
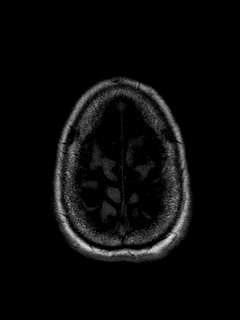
[im 144/144]
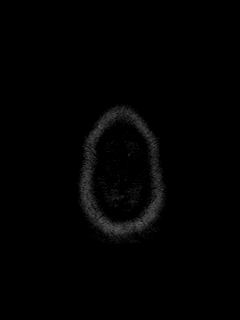

[Series 12: T2 · coronal · 5.0mm · 0.45mm/px · 2 of 27 slices shown (2 of 2)]
[im 1/27]
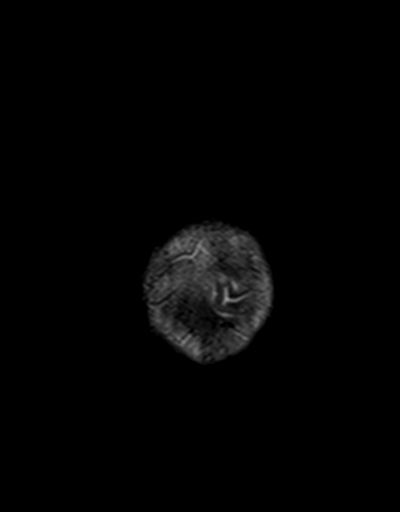
[im 27/27]
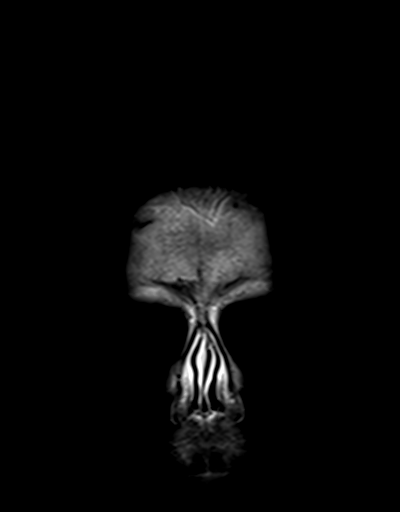

[48 of 48 positions shown; findings below may reference images not displayed]

FINDINGS: Brain: No recent infarction, hemorrhage, hydrocephalus, extra-axial
collection or mass lesion. Cerebral volume loss without specific
pattern. Preserved medial temporal volume. Less than typical white
matter chronic ischemic changes for age.

Vascular: Normal flow voids

Skull and upper cervical spine: Normal marrow signal. Left frontal
scalp osteoma.

Sinuses/Orbits: Unremarkable
IMPRESSION: 1. No reversible cause for symptoms.
2. Aging brain.

## 2020-09-21 NOTE — Progress Notes (Signed)
Patient advised and voiced understanding of MRI results.

## 2020-09-27 DIAGNOSIS — M545 Low back pain, unspecified: Secondary | ICD-10-CM | POA: Diagnosis not present

## 2020-09-27 DIAGNOSIS — M25551 Pain in right hip: Secondary | ICD-10-CM | POA: Diagnosis not present

## 2020-09-28 ENCOUNTER — Encounter: Payer: Medicare PPO | Admitting: Psychology

## 2020-10-02 DIAGNOSIS — M5416 Radiculopathy, lumbar region: Secondary | ICD-10-CM | POA: Diagnosis not present

## 2020-10-04 DIAGNOSIS — M5416 Radiculopathy, lumbar region: Secondary | ICD-10-CM | POA: Diagnosis not present

## 2020-10-11 DIAGNOSIS — M5416 Radiculopathy, lumbar region: Secondary | ICD-10-CM | POA: Diagnosis not present

## 2020-10-23 DIAGNOSIS — M5416 Radiculopathy, lumbar region: Secondary | ICD-10-CM | POA: Diagnosis not present

## 2020-10-26 DIAGNOSIS — E785 Hyperlipidemia, unspecified: Secondary | ICD-10-CM | POA: Diagnosis not present

## 2020-10-26 DIAGNOSIS — N852 Hypertrophy of uterus: Secondary | ICD-10-CM | POA: Diagnosis not present

## 2020-10-26 DIAGNOSIS — R39198 Other difficulties with micturition: Secondary | ICD-10-CM | POA: Diagnosis not present

## 2020-10-26 DIAGNOSIS — R309 Painful micturition, unspecified: Secondary | ICD-10-CM | POA: Diagnosis not present

## 2020-10-30 DIAGNOSIS — M5416 Radiculopathy, lumbar region: Secondary | ICD-10-CM | POA: Diagnosis not present

## 2020-10-31 DIAGNOSIS — M5442 Lumbago with sciatica, left side: Secondary | ICD-10-CM | POA: Diagnosis not present

## 2020-10-31 DIAGNOSIS — R39198 Other difficulties with micturition: Secondary | ICD-10-CM | POA: Diagnosis not present

## 2020-10-31 DIAGNOSIS — E7800 Pure hypercholesterolemia, unspecified: Secondary | ICD-10-CM | POA: Diagnosis not present

## 2020-10-31 DIAGNOSIS — L989 Disorder of the skin and subcutaneous tissue, unspecified: Secondary | ICD-10-CM | POA: Diagnosis not present

## 2020-11-01 DIAGNOSIS — M5416 Radiculopathy, lumbar region: Secondary | ICD-10-CM | POA: Diagnosis not present

## 2020-11-08 DIAGNOSIS — M5416 Radiculopathy, lumbar region: Secondary | ICD-10-CM | POA: Diagnosis not present

## 2020-11-15 ENCOUNTER — Other Ambulatory Visit: Payer: Self-pay | Admitting: Family Medicine

## 2020-11-15 DIAGNOSIS — N852 Hypertrophy of uterus: Secondary | ICD-10-CM

## 2020-11-16 DIAGNOSIS — D122 Benign neoplasm of ascending colon: Secondary | ICD-10-CM | POA: Diagnosis not present

## 2020-11-16 DIAGNOSIS — D120 Benign neoplasm of cecum: Secondary | ICD-10-CM | POA: Diagnosis not present

## 2020-11-16 DIAGNOSIS — Z1211 Encounter for screening for malignant neoplasm of colon: Secondary | ICD-10-CM | POA: Diagnosis not present

## 2020-11-20 DIAGNOSIS — M5416 Radiculopathy, lumbar region: Secondary | ICD-10-CM | POA: Diagnosis not present

## 2020-11-21 DIAGNOSIS — D122 Benign neoplasm of ascending colon: Secondary | ICD-10-CM | POA: Diagnosis not present

## 2020-11-21 DIAGNOSIS — D120 Benign neoplasm of cecum: Secondary | ICD-10-CM | POA: Diagnosis not present

## 2020-11-23 ENCOUNTER — Ambulatory Visit (INDEPENDENT_AMBULATORY_CARE_PROVIDER_SITE_OTHER): Payer: Medicare PPO | Admitting: Psychology

## 2020-11-23 DIAGNOSIS — R413 Other amnesia: Secondary | ICD-10-CM

## 2020-11-23 NOTE — Progress Notes (Signed)
   Neuropsychology Report Tillie Rung. [place] Department of Neurology     Ms. Geary's husband called the morning of her appointment to cancel due to her waking not feeling well. She appears to have been rescheduled for testing on 01/11/2021 at 1:00pm.

## 2020-11-24 DIAGNOSIS — M545 Low back pain, unspecified: Secondary | ICD-10-CM | POA: Diagnosis not present

## 2020-11-27 DIAGNOSIS — M5416 Radiculopathy, lumbar region: Secondary | ICD-10-CM | POA: Diagnosis not present

## 2020-11-29 ENCOUNTER — Encounter: Payer: Medicare PPO | Admitting: Psychology

## 2020-11-29 DIAGNOSIS — M545 Low back pain, unspecified: Secondary | ICD-10-CM | POA: Diagnosis not present

## 2020-12-06 ENCOUNTER — Other Ambulatory Visit: Payer: Self-pay

## 2020-12-06 ENCOUNTER — Ambulatory Visit
Admission: RE | Admit: 2020-12-06 | Discharge: 2020-12-06 | Disposition: A | Discharge status: Home / Self Care | Payer: Medicare PPO | Source: Ambulatory Visit | Attending: Family Medicine | Admitting: Family Medicine

## 2020-12-06 DIAGNOSIS — N852 Hypertrophy of uterus: Secondary | ICD-10-CM

## 2020-12-11 DIAGNOSIS — M5416 Radiculopathy, lumbar region: Secondary | ICD-10-CM | POA: Diagnosis not present

## 2020-12-28 DIAGNOSIS — M5416 Radiculopathy, lumbar region: Secondary | ICD-10-CM | POA: Diagnosis not present

## 2021-01-11 ENCOUNTER — Encounter: Payer: Medicare PPO | Admitting: Psychology

## 2021-01-11 ENCOUNTER — Encounter: Payer: Self-pay | Admitting: Psychology

## 2021-01-11 ENCOUNTER — Other Ambulatory Visit: Payer: Self-pay

## 2021-01-11 ENCOUNTER — Ambulatory Visit (INDEPENDENT_AMBULATORY_CARE_PROVIDER_SITE_OTHER): Payer: Medicare PPO | Admitting: Psychology

## 2021-01-11 DIAGNOSIS — R4189 Other symptoms and signs involving cognitive functions and awareness: Secondary | ICD-10-CM

## 2021-01-11 DIAGNOSIS — I493 Ventricular premature depolarization: Secondary | ICD-10-CM | POA: Insufficient documentation

## 2021-01-11 DIAGNOSIS — G894 Chronic pain syndrome: Secondary | ICD-10-CM | POA: Insufficient documentation

## 2021-01-11 DIAGNOSIS — K219 Gastro-esophageal reflux disease without esophagitis: Secondary | ICD-10-CM | POA: Insufficient documentation

## 2021-01-11 DIAGNOSIS — E039 Hypothyroidism, unspecified: Secondary | ICD-10-CM | POA: Insufficient documentation

## 2021-01-11 DIAGNOSIS — M8580 Other specified disorders of bone density and structure, unspecified site: Secondary | ICD-10-CM | POA: Insufficient documentation

## 2021-01-11 DIAGNOSIS — M810 Age-related osteoporosis without current pathological fracture: Secondary | ICD-10-CM | POA: Insufficient documentation

## 2021-01-11 DIAGNOSIS — D225 Melanocytic nevi of trunk: Secondary | ICD-10-CM | POA: Insufficient documentation

## 2021-01-11 DIAGNOSIS — E079 Disorder of thyroid, unspecified: Secondary | ICD-10-CM | POA: Insufficient documentation

## 2021-01-11 DIAGNOSIS — R011 Cardiac murmur, unspecified: Secondary | ICD-10-CM | POA: Insufficient documentation

## 2021-01-11 DIAGNOSIS — M25552 Pain in left hip: Secondary | ICD-10-CM | POA: Insufficient documentation

## 2021-01-11 NOTE — Progress Notes (Signed)
NEUROPSYCHOLOGICAL EVALUATION [place] Department of Neurology  Date of Evaluation: January 11, 2021  Reason for Referral:   Kristine Stafford is a 70 y.o. right-handed Caucasian female referred by Sharene Butters, PA-C , to characterize her current cognitive functioning and assist with diagnostic clarity and treatment planning in the context of subjective cognitive decline.   Assessment and Plan:   During interview, Kristine Stafford was seen grimacing and frequently adjusting her position in her seat due to discomfort. She repeatedly stated that current pain stemming from her left hip was severe and mentally distracting. Pain levels were to the extent that she was tearful during the interview at several time points.   Ultimately, the decision was made to postpone testing due to the severity of acute pain symptoms. I do have suspicion regarding underlying cognitive impairment. Given the degree of reported ADL dysfunction, a preliminary diagnosis of a major neurocognitive disorder (i.e., dementia) seems likely. However, objective testing would be ideal to further substantiate these suspicions.  She was rescheduled to complete testing on 01/15/2021 at 1:00pm in the hope that pain symptoms will be less severe at that time.   Review of Records:   Kristine Stafford was seen by [facility] Neurology Sharene Butters, PA-C) on 09/05/2020 for an evaluation of memory loss. Memory issues were said to be present for the past year. Her significant other Kristine Stafford reported that she is increasingly asking the same questions, repeating the same stories, and that "she cannot retain new information." He noted that she has increased hoarding old objects and she added "this is clogging my life." She is independent with dressing and bathing. Kristine Stafford manages her medications because she has a tendency to forget. He also manages bill paying as she has missed some payments in the past. He does all the driving  after a past instance where she could not remember how to get from the pharmacy to the grocery store. She ambulates without difficulty and without a cane or walker. She denied REM sleep behaviors, paranoia, hallucinations, headaches recent falls, double vision, dizziness, focal numbness or tingling, unilateral weakness, tremors, urine incontinence or retention, constipation or diarrhea, anosmia, history of OSA, or history of substance abuse. She did have an injury to the head at home where she slipped while walking down some steps about one year ago. No loss in consciousness was reported. Performance on a brief cognitive screening instrument (MOCA) was 15/30. Ultimately, Kristine Stafford was referred for a comprehensive neuropsychological evaluation to characterize her cognitive abilities and to assist with diagnostic clarity and treatment planning.   Brain MRI on 09/20/2020 revealed generalized cerebral volume loss of unspecified severity, as well as less than typical small vessel ischemic changes for her age.  Past Medical History:  Diagnosis Date   Abnormal Pap smear    Chronic pain syndrome    GERD (gastroesophageal reflux disease)    Heart murmur    Herpes simplex without mention of complication    HSV 2    Left hip pain    Migraine    Osteopenia    in neck   Osteoporosis    PVC (premature ventricular contraction)    Thyroid disease     Past Surgical History:  Procedure Laterality Date   APPENDECTOMY      Current Outpatient Medications:    alendronate (FOSAMAX) 70 MG tablet, Take 70 mg by mouth once a week. Take with a full glass of water on an empty stomach., Disp: , Rfl:  calcium citrate-vitamin D (CITRACAL+D) 315-200 MG-UNIT tablet, Take 1 tablet by mouth 2 (two) times daily., Disp: , Rfl:   Clinical Interview:   The following information was obtained during a clinical interview with Kristine Stafford and her significant other Kristine Stafford prior to cognitive testing. During interview, Ms.  Stafford had numerous instances where she appeared unable to comprehend what was being asked of her. There were times where she would hear a question, look down, breath heavy, make a few statements about being unsure, and then seemingly forget the initial question and need it to be repeated. She also repeatedly commented how acute pain symptoms impacted her ability to concentrate and became tearful due to pain symptoms several times. Kristine Stafford provided much of the obtained background information.   Cognitive Symptoms: Decreased short-term memory: Endorsed. However, Kristine Stafford was unable to provide any specific examples and appeared to not comprehend questions that were asked of her. Kristine Stafford noted that she has exhibited progressive memory loss for at least the past year. Primary examples included her repeating herself often and newly learned information being lost quite quickly.  Decreased long-term memory: Denied. Decreased attention/concentration: Endorsed. She reported that she "could get distracted quite easily." When asked if this was a change, she was confused. This question had to be repeated and/or rephrased 3-4 times before she was able to state that this was happening "maybe a bit more than normal" with limited confidence.  Reduced processing speed: Denied. Difficulties with executive functions: Denied. Kristine Stafford noted increased indecisiveness in that it takes her much longer to make decision. They did not report any trouble with impulsivity. Kristine Stafford did not report significant personality changes. Kristine Stafford did not appear to understand the concept of personality changes when asked.   Difficulties with emotion regulation: Denied. Difficulties with receptive language: Denied. Difficulties with word finding: Denied. Decreased visuoperceptual ability: Denied.  Difficulties completing ADLs: Endorsed. Kristine Stafford is fully responsible for medication management, financial management, and bill paying responsibilities due to  Kristine Stafford being unable to satisfactorily complete these tasks independently. She has stopped driving due to cognitive concerns and prior instances where she got lost even in familiar surroundings.   Additional Medical History: History of traumatic brain injury/concussion: Unclear. Kristine Stafford reported falling and hitting her head while attempting to carry a folding table either up or down some steps. She denied a loss in consciousness or persisting symptoms. She was unclear when this occurred. Kristine Stafford noted that this happened before the start of the COVID-19 pandemic. No other head injuries were reported.  History of stroke: Denied. History of seizure activity: Denied. History of known exposure to toxins: Denied. Symptoms of chronic pain: Endorsed. She reported severe, debilitating left hip pain. She was unable to provide further details about this experience outside of some days being worse than others. Kristine Stafford noted that prior imaging had suggested degenerative arthritic changes as the likely cause. He noted that she recently saw her orthopedist and received a pain-killing injection. However, this was largely ineffective at managing symptoms.  Experience of frequent headaches/migraines: Denied. Frequent instances of dizziness/vertigo: Denied.  Sensory changes: She wears glasses with benefit. Other sensory changes/difficulties (e.g., hearing, taste, or smell) were denied.  Balance/coordination difficulties: Denied. She also denied any recent falls.  Other motor difficulties: Denied.  Sleep History: Estimated hours obtained each night: Unclear. Total hours slept was said to be very inconsistent given ongoing hip pain influencing her ability to fall and remain asleep.  Difficulties falling asleep: Endorsed. Difficulties staying asleep: Endorsed. Feels rested  and refreshed upon awakening: Variably so depending on the quantity and quality of sleep she is able to obtain the night before.   History of  snoring: Denied. History of waking up gasping for air: Denied. Witnessed breath cessation while asleep: Denied.  History of vivid dreaming: Denied. Excessive movement while asleep: Denied. Instances of acting out her dreams: Denied.  Psychiatric/Behavioral Health History: Depression: Denied. While she acknowledged ongoing frustration and increased agitation surrounding chronic pain, she denied to her knowledge any prior mental health concerns or diagnoses. Current or remote suicidal ideation, intent, or plan was denied.  Anxiety: Denied. Mania: Denied. Trauma History: Denied. Visual/auditory hallucinations: Denied. Delusional thoughts: Denied.  Tobacco: Denied. Alcohol: Kristine Stafford reported that she will consume a few small glasses of wine per week on average. They denied a history of problematic alcohol abuse or dependence.  Recreational drugs: Denied.  Family History: Problem Relation Age of Onset   Emphysema Mother    Lung cancer Mother    Kidney failure Mother    Bipolar disorder Mother    Interstitial cystitis Mother    Prostate cancer Father    Alzheimer's disease Father    Liver disease Sister        hep c    Pleurisy Maternal Grandmother    This information was confirmed by Kristine Stafford.  Academic/Vocational History: Highest level of educational attainment: 18 years. She earned a Conservator, museum/gallery in [employer] and described herself as a strong (mostly A) student in academic settings. No relative weaknesses were identified.  History of developmental delay: Denied. History of grade repetition: Denied. Enrollment in special education courses: Denied. History of LD/ADHD: Denied.  Employment: Retired. She previously worked as a Development worker, international aid.   Informed Consent and Coding/Compliance:   The current evaluation represents a clinical evaluation for the purposes previously outlined by the referral source and is in no way reflective of a forensic evaluation.   Ms.  Stafford was provided with a verbal description of the nature and purpose of the present neuropsychological evaluation. Also reviewed were the foreseeable risks and/or discomforts and benefits of the procedure, limits of confidentiality, and mandatory reporting requirements of this provider. The patient was given the opportunity to ask questions and receive answers about the evaluation. Oral consent to participate was provided by the patient.   This evaluation was conducted by Christia Reading, Ph.D., ABPP-CN, board certified clinical neuropsychologist. Kristine Stafford completed a clinical interview with Dr. Melvyn Novas, billed as one unit [phone]. Testing will be completed on 01/15/2021.

## 2021-01-15 ENCOUNTER — Ambulatory Visit: Payer: Medicare PPO | Admitting: Psychology

## 2021-01-15 ENCOUNTER — Encounter: Payer: Self-pay | Admitting: Psychology

## 2021-01-15 ENCOUNTER — Other Ambulatory Visit: Payer: Self-pay

## 2021-01-15 ENCOUNTER — Ambulatory Visit (INDEPENDENT_AMBULATORY_CARE_PROVIDER_SITE_OTHER): Payer: Medicare PPO | Admitting: Psychology

## 2021-01-15 DIAGNOSIS — E7800 Pure hypercholesterolemia, unspecified: Secondary | ICD-10-CM | POA: Insufficient documentation

## 2021-01-15 DIAGNOSIS — F0280 Dementia in other diseases classified elsewhere without behavioral disturbance: Secondary | ICD-10-CM | POA: Diagnosis not present

## 2021-01-15 DIAGNOSIS — G894 Chronic pain syndrome: Secondary | ICD-10-CM

## 2021-01-15 DIAGNOSIS — G309 Alzheimer's disease, unspecified: Secondary | ICD-10-CM | POA: Diagnosis not present

## 2021-01-15 DIAGNOSIS — R4189 Other symptoms and signs involving cognitive functions and awareness: Secondary | ICD-10-CM

## 2021-01-15 HISTORY — DX: Dementia in other diseases classified elsewhere, unspecified severity, without behavioral disturbance, psychotic disturbance, mood disturbance, and anxiety: F02.80

## 2021-01-15 NOTE — Progress Notes (Signed)
   Psychometrician Note   Cognitive testing was administered to Kristine Stafford by Milana Kidney, B.S. (psychometrist) under the supervision of Dr. Christia Reading, Ph.D., licensed psychologist on 01/15/21. Kristine Stafford did not appear overtly distressed by the testing session per behavioral observation or responses across self-report questionnaires. Rest breaks were offered.    The battery of tests administered was selected by Dr. Christia Reading, Ph.D. with consideration to Kristine Stafford's current level of functioning, the nature of her symptoms, emotional and behavioral responses during interview, level of literacy, observed level of motivation/effort, and the nature of the referral question. This battery was communicated to the psychometrist. Communication between Dr. Christia Reading, Ph.D. and the psychometrist was ongoing throughout the evaluation and Dr. Christia Reading, Ph.D. was immediately accessible at all times. Dr. Christia Reading, Ph.D. provided supervision to the psychometrist on the date of this service to the extent necessary to assure the quality of all services provided.    Kristine Stafford will return within approximately 1-2 weeks for an interactive feedback session with Dr. Melvyn Novas at which time her test performances, clinical impressions, and treatment recommendations will be reviewed in detail. Kristine Stafford understands she can contact our office should she require our assistance before this time.  A total of 110 minutes of billable time were spent face-to-face with Kristine Stafford by the psychometrist. This includes both test administration and scoring time. Billing for these services is reflected in the clinical report generated by Dr. Christia Reading, Ph.D.  This note reflects time spent with the psychometrician and does not include test scores or any clinical interpretations made by Dr. Melvyn Novas. The full report will follow in a separate note.

## 2021-01-15 NOTE — Progress Notes (Signed)
NEUROPSYCHOLOGICAL EVALUATION [place]. [facility] Department of Neurology  Date of Evaluation: January 15, 2021  Reason for Referral:   Kristine Stafford is a 70 y.o. right-handed Caucasian female referred by Sharene Butters, PA-C , to characterize her current cognitive functioning and assist with diagnostic clarity and treatment planning in the context of subjective cognitive decline.   Assessment and Plan:   Clinical Impression(s): Kristine Stafford pattern of performance is suggestive of diffuse, severe cognitive impairment with performances commonly scoring in the exceptionally low normative range. She did reasonably well on an isolated line orientation task and exhibited some performance variability across processing speed and confrontation naming. However, in the case of the former two domains, performance was below expectation overall. Performance was consistently impaired across domains of basic attention, cognitive flexibility, safety/judgment, receptive language, verbal fluency, visuoconstructional abilities, and all aspects of learning and memory. Functionally, her significant other is fully responsible for medication management, financial management, and bill paying responsibilities. She also stopped driving due to cognitive concerns and prior instances where she got lost even in familiar surroundings. Overall, given ADL dysfunction coupled with severe impairment, Kristine Stafford meets diagnostic criteria for a Major Neurocognitive Disorder ("dementia") at the present time.  Unfortunately, the most likely etiology at the present time is Alzheimer's disease. When initially learning novel information, Kristine Stafford did not benefit from repeated learning trials. After a brief delay, she was fully amnestic (i.e., 0% retention) across all three memory tasks. She further displayed poor performance across yes/no recognition trials. Taken together, this suggests a severe memory  storage deficit, which is the hallmark characteristic of Alzheimer's disease. Further deficits in confrontation naming, verbal fluency (semantic worse than phonemic), executive functioning, and visuospatial abilities are consistent with this disease process and would suggest that she is perhaps towards moderate stages of this illness. Behaviorally, she does not display features of Lewy body dementia or frontotemporal dementia. Cognitive patterns are more in line with Alzheimer's disease than these neurodegenerative conditions as well. Recent neuroimaging revealed minimal vascular changes, making a vascular dementia presentation quite unlikely. While pain and anxiety can negatively influence cognitive abilities, current impairment is above and beyond these contributions alone. Continued medical monitoring will be important moving forward.   Recommendations: Kristine Stafford should schedule a follow-up appointment with Kristine Stafford to discuss medication options for ongoing memory loss. It is important to highlight that these medications have been shown to slow functional decline in some individuals. No current treatment is able to stop or reverse cognitive decline when caused by a neurodegenerative illness.   Valium has well established cognitive side effects. If currently taking regularly, she may wish to discuss alternative medications with her prescribing physician.   Should there be further progression of deficits over time, Kristine Stafford is unlikely to regain any independent living skills lost. She will likely benefit from the establishment and maintenance of a routine in order to maximize her functional abilities over time.  It will be important for Kristine Stafford to have another person with her when in situations where she may need to process information, weigh the pros and cons of different options, and make decisions, in order to ensure that she fully understands and recalls all information to be  considered.  If not already done, Kristine Stafford and her family may want to discuss her wishes regarding durable power of attorney and medical decision making, so that she can have input into these choices. Additionally, they may wish to discuss future plans for  caretaking and seek out community options for in home/residential care should they become necessary.  While performance across neurocognitive testing is not a strong predictor of an individual's safety operating a motor vehicle, I do agree with Kristine Stafford and her significant other's decision to stop her from driving given notable cognitive impairment. If there is ever a desire to return to driving, I would recommend that she first complete a formal driving evaluation. They could reach out to the following agencies: [employer] in [place]: [phone] Driver Rehabilitative Services: [facility]: [place]: [phone] or [phone]  Kristine Stafford is encouraged to attend to lifestyle factors for brain health (e.g., regular physical exercise, good nutrition habits, regular participation in cognitively-stimulating activities, and general stress management techniques), which are likely to have benefits for both emotional adjustment and cognition. Optimal control of vascular risk factors (including safe cardiovascular exercise and adherence to dietary recommendations) is encouraged. Continued participation in activities which provide mental stimulation and social interaction is also recommended.   Important information to remember should be provided in written format in all instances. This should be placed in a highly visible and commonly frequented area of her residence to try and help promote recall.  Review of Records:   Kristine Stafford was seen by [facility] Sharene Butters, PA-C) on 09/05/2020 for an evaluation of memory loss. Memory issues were said to be present for the past  year. Her significant other [name] reported that she is increasingly asking the same questions, repeating the same stories, and that "she cannot retain new information." He noted that she has increased hoarding old objects and she added "this is clogging my life." She is independent with dressing and bathing. [name] manages her medications because she has a tendency to forget. He also manages bill paying as she has missed some payments in the past. He does all the driving after a past instance where she could not remember how to get from the pharmacy to the grocery store. She ambulates without difficulty and without a cane or walker. She denied REM sleep behaviors, paranoia, hallucinations, headaches, recent falls, double vision, dizziness, focal numbness or tingling, unilateral weakness, tremors, urine incontinence or retention, constipation or diarrhea, anosmia, history of OSA, or history of substance abuse. She did have an injury to the head at home where she slipped while walking down some steps about one year ago. No loss in consciousness was reported. Performance on a brief cognitive screening instrument (MOCA) was 15/30. Ultimately, Kristine Stafford was referred for a comprehensive neuropsychological evaluation to characterize her cognitive abilities and to assist with diagnostic clarity and treatment planning.    Brain MRI on 09/20/2020 revealed generalized cerebral volume loss of unspecified severity, as well as less than typical small vessel ischemic changes for her age.  Past Medical History:  Diagnosis Date   Abnormal Pap smear    Chronic pain syndrome    GERD (gastroesophageal reflux disease)    Heart murmur    Herpes simplex without mention of complication    HSV 2    Left hip pain    Migraine    Osteopenia    in neck   Osteoporosis    Pure hypercholesterolemia    PVC (premature ventricular contraction)    Thyroid disease     Past Surgical History:  Procedure Laterality Date   APPENDECTOMY       Current Outpatient Medications:    atorvastatin (LIPITOR) 20 MG tablet, 1 tablet, Disp: , Rfl:  alendronate (FOSAMAX) 70 MG tablet, Take 70 mg by mouth once a week. Take with a full glass of water on an empty stomach., Disp: , Rfl:    ALPRAZolam (NIRAVAM) 0.5 MG dissolvable tablet, Take by mouth., Disp: , Rfl:    calcium citrate-vitamin D (CITRACAL+D) 315-200 MG-UNIT tablet, Take 1 tablet by mouth 2 (two) times daily., Disp: , Rfl:    MEDROL 4 MG TBPK tablet, Take by mouth., Disp: , Rfl:    meloxicam (MOBIC) 15 MG tablet, Take 15 mg by mouth daily as needed., Disp: , Rfl:    predniSONE (STERAPRED UNI-PAK 21 TAB) 10 MG (21) TBPK tablet, Take by mouth., Disp: , Rfl:    traMADol (ULTRAM) 50 MG tablet, Take 50 mg by mouth 3 (three) times daily., Disp: , Rfl:    VALIUM 5 MG tablet, Take 5 mg by mouth once., Disp: , Rfl:   Clinical Interview:   The following information was obtained during a clinical interview with Kristine Stafford and her significant other [name] prior to cognitive testing. During interview, Kristine Stafford had numerous instances where she appeared unable to comprehend what was being asked of her. There were times where she would hear a question, look down, breath heavy, make a few statements about being unsure, and then seemingly forget the initial question and need it to be repeated. She also repeatedly commented how acute pain symptoms impacted her ability to concentrate and became tearful due to pain symptoms several times. [name] provided much of the obtained background information.    Cognitive Symptoms: Decreased short-term memory: Endorsed. However, Kristine Stafford was unable to provide any specific examples and appeared to not comprehend questions that were asked of her. [name] noted that she has exhibited progressive memory loss for at least the past year. Primary examples included her repeating herself often and newly learned information being lost quite quickly.  Decreased long-term  memory: Denied. Decreased attention/concentration: Endorsed. She reported that she "could get distracted quite easily." When asked if this was a change, she was confused. This question had to be repeated and/or rephrased 3-4 times before she was able to state that this was happening "maybe a bit more than normal" with limited confidence.  Reduced processing speed: Denied. Difficulties with executive functions: Denied. [name] noted increased indecisiveness in that it takes her much longer to make decision. They did not report any trouble with impulsivity. [name] did not report significant personality changes. Kristine Stafford did not appear to understand the concept of personality changes when asked.   Difficulties with emotion regulation: Denied. Difficulties with receptive language: Denied. Difficulties with word finding: Denied. Decreased visuoperceptual ability: Denied.   Difficulties completing ADLs: Endorsed. [name] is fully responsible for medication management, financial management, and bill paying responsibilities due to Ms. Weatherman being unable to satisfactorily complete these tasks independently. She has stopped driving due to cognitive concerns and prior instances where she got lost even in familiar surroundings.    Additional Medical History: History of traumatic brain injury/concussion: Unclear. Kristine Stafford reported falling and hitting her head while attempting to carry a folding table either up or down some steps. She denied a loss in consciousness or persisting symptoms. She was unclear when this occurred. [name] noted that this happened before the start of the COVID-19 pandemic. No other head injuries were reported.  History of stroke: Denied. History of seizure activity: Denied. History of known exposure to toxins: Denied. Symptoms of chronic pain: Endorsed. She reported severe, debilitating left hip pain. She was unable  to provide further details about this experience outside of some days being  worse than others. [name] noted that prior imaging had suggested degenerative arthritic changes as the likely cause. He noted that she recently saw her orthopedist and received a pain-killing injection. However, this was largely ineffective at managing symptoms.  Experience of frequent headaches/migraines: Denied. Frequent instances of dizziness/vertigo: Denied.   Sensory changes: She wears glasses with benefit. Other sensory changes/difficulties (e.g., hearing, taste, or smell) were denied.  Balance/coordination difficulties: Denied. She also denied any recent falls.  Other motor difficulties: Denied.   Sleep History: Estimated hours obtained each night: Unclear. Total hours slept was said to be very inconsistent given ongoing hip pain influencing her ability to fall and remain asleep.  Difficulties falling asleep: Endorsed. Difficulties staying asleep: Endorsed. Feels rested and refreshed upon awakening: Variably so depending on the quantity and quality of sleep she is able to obtain the night before.    History of snoring: Denied. History of waking up gasping for air: Denied. Witnessed breath cessation while asleep: Denied.   History of vivid dreaming: Denied. Excessive movement while asleep: Denied. Instances of acting out her dreams: Denied.   Psychiatric/Behavioral Health History: Depression: Denied. While she acknowledged ongoing frustration and increased agitation surrounding chronic pain, she denied to her knowledge any prior mental health concerns or diagnoses. Current or remote suicidal ideation, intent, or plan was denied.  Anxiety: Denied. Mania: Denied. Trauma History: Denied. Visual/auditory hallucinations: Denied. Delusional thoughts: Denied.   Tobacco: Denied. Alcohol: [name] reported that she will consume a few small glasses of wine per week on average. They denied a history of problematic alcohol abuse or dependence.  Recreational drugs: Denied.  Family  History: Problem Relation Age of Onset   Emphysema Mother    Lung cancer Mother    Kidney failure Mother    Bipolar disorder Mother    Interstitial cystitis Mother    Prostate cancer Father    Alzheimer's disease Father    Liver disease Sister        hep c    Pleurisy Maternal Grandmother    This information was confirmed by Ms. Megan Salon.  Academic/Vocational History: Highest level of educational attainment: 18 years. She earned a Conservator, museum/gallery in [employer] and described herself as a strong (mostly A) student in academic settings. No relative weaknesses were identified.  History of developmental delay: Denied. History of grade repetition: Denied. Enrollment in special education courses: Denied. History of LD/ADHD: Denied.   Employment: Retired. She previously worked as a Development worker, international aid.   Evaluation Results:   Behavioral Observations: Ms. Wieseler was accompanied by her significant other [name], arrived to her appointment on time, and was appropriately dressed and groomed. During interview (01/11/2021), she appeared alert and oriented. She ambulated slowly and gingerly, assumedly due to ongoing pain symptoms. She brought personal cushions to help alleviate pain while sitting. Gross motor functioning appeared intact upon informal observation and no abnormal movements (e.g., tremors) were noted. Her affect was visibly upset during interview at several time points. She repeatedly commented how acute pain symptoms impact her ability to concentrate and became tearful due to pain symptoms several times. She had numerous instances where she appeared unable to comprehend what was being asked of her. There were times where she would hear a question, look down, breath heavy, make a few statements about being unsure, and then seemingly forget the initial question and need it to be repeated. [name] was helpful in relaying historical information. Outside  of this, spontaneous speech was largely  fluent. Some word finding difficulties were observed as described above. Thought processes were generally coherent, organized, and normal in content. Insight into her cognitive difficulties appeared limited in that objective testing revealed widespread and often severe cognitive impairment.   During testing (01/15/2021), Ms. Goosby did not describe ongoing severe pain similar to symptoms experienced during interview several days prior. However, she was notably anxious about the testing process. Due to the extent of this anxiety, it was requested that the testing session be abbreviated. This was accommodated. Sustained attention was appropriate. Task engagement was adequate and she persisted when challenged. She appeared to lose information that was spoken to her quite quickly and she frequently required instruction repetition. This was even apparent across mood questionnaires where she had confusion completing the necessary forms. When information was read aloud to her, she was unable to remember the instructions or the range of possible answers. Overall, Ms. Spang was cooperative with the clinical interview and subsequent testing procedures.   Adequacy of Effort: The validity of neuropsychological testing is limited by the extent to which the individual being tested may be assumed to have exerted adequate effort during testing. Ms. Evans expressed her intention to perform to the best of her abilities and exhibited adequate task engagement and persistence. Scores across stand-alone and embedded performance validity measures were variable. However, her single below expectation score is believed to be due to true, severe cognitive impairment rather than poor engagement or attempts to perform poorly. As such, the results of the current evaluation are believed to be a valid representation of Ms. Hennings's current cognitive functioning.  Test Results: Ms. Yankey was poorly oriented at the time of the  current evaluation. She was unable to state her age and could not recall her phone number. She was also unable to state the current date or time.   Intellectual abilities based upon educational and vocational attainment were estimated to be in the average to above average range. Premorbid abilities were estimated to be within the average range based upon a single-word reading test.   Processing speed was exceptionally low to below average. Basic attention was well below average. More complex attention (e.g., working memory) was unable to be assessed. Cognitive flexibility was exceptionally low. No other aspects of executive functioning were able to be assessed. She performed in the well below average range on a task assessing safety and judgment. Points were lost due to a combination of vague responses, as well as Ms. Salas being sometimes unable to understand the question asked.   Receptive language abilities were unable to be directly assessed. However, there is likely ongoing impairment in this domain due to notable comprehension difficulties during both interview and testing. These are likely exacerbated due to memory impairment (see below). Assessed expressive language was mildly variable. Phonemic fluency was well below average, semantic fluency was exceptionally low, and confrontation naming was below average to average on a screening instrument but exceptionally low across a more comprehensive task.     Assessed visuoconstructional abilities were exceptionally low. Points were lost on her drawing of a clock due to her placing the numbers in counter-clockwise fashion, as well as her being unable to attempt placing the clock hands. Points were lost on her copy of a complex figure due to several mild to moderate visual distortions, as well as one internal aspect being omitted entirely. She did perform in the below average to average range across a line orientation task.  Learning (i.e., encoding)  of novel verbal information was exceptionally low. Spontaneous delayed recall (i.e., retrieval) of previously learned information was also exceptionally low. Retention rates were 0% across a story learning task, 0% across a list learning task, and 0% across a figure drawing task. Performance across recognition tasks was notably below expectation, suggesting very limited evidence for information consolidation.   Results of emotional screening instruments suggested that recent symptoms of generalized anxiety were in the minimal range, while symptoms of depression were within the mild range. A screening instrument assessing recent sleep quality suggested the presence of minimal sleep dysfunction.  Tables of Scores:   Note: This summary of test scores accompanies the interpretive report and should not be considered in isolation without reference to the appropriate sections in the text. Descriptors are based on appropriate normative data and may be adjusted based on clinical judgment. Terms such as "Within Normal Limits" and "Outside Normal Limits" are used when a more specific description of the test score cannot be determined.       Percentile - Normative Descriptor > 98 - Exceptionally High 91-97 - Well Above Average 75-90 - Above Average 25-74 - Average 9-24 - Below Average 2-8 - Well Below Average < 2 - Exceptionally Low       Validity:   DESCRIPTOR       Dot Counting Test: --- --- Within Normal Limits  RBANS Effort Index: --- --- Outside Normal Limits       Orientation:      Raw Score Percentile   NAB Orientation, Form 1 20/29 --- ---       Cognitive Screening:      Raw Score Percentile   SLUMS: 9/30 --- ---       RBANS, Form A: Standard Score/ Scaled Score Percentile   Total Score 51 <1 Exceptionally Low  Immediate Memory 49 <1 Exceptionally Low    List Learning 1 <1 Exceptionally Low    Story Memory 3 1 Exceptionally Low  Visuospatial/Constructional 69 2 Well Below Average     Figure Copy 3 1 Exceptionally Low    Line Orientation 14/20 17-25 Below Average to Average  Language 74 4 Well Below Average    Picture Naming 9/10 26-50 Average    Semantic Fluency 1 <1 Exceptionally Low  Attention 49 <1 Exceptionally Low    Digit Span 4 2 Well Below Average    Coding 1 <1 Exceptionally Low  Delayed Memory 48 <1 Exceptionally Low    List Recall 0/10 <2 Exceptionally Low    List Recognition 14/20 <2 Exceptionally Low    Story Recall 1 <1 Exceptionally Low    Story Recognition 8/12 8-15 Below Average    Figure Recall 1 <1 Exceptionally Low    Figure Recognition 2/8 2-3 Well Below Average       Intellectual Functioning:      Standard Score Percentile   Barona Formula Estimated Premorbid IQ 112 79 Above Average        Standard Score Percentile   Test of Premorbid Functioning: 106 66 Average       Attention/Executive Function:     Trail Making Test (TMT): Raw Score (T Score) Percentile     Part A 49 secs., 0 errors (37) 9 Below Average    Part B Discontinued --- Impaired        NAB Executive Functions Module, Form 1: T Score Percentile     Judgment 35 7 Well Below Average  Language:     Verbal Fluency Test: Raw Score (T Score) Percentile     Phonemic Fluency (FAS) 25 (30) 2 Well Below Average    Animal Fluency 7 (13) <1 Exceptionally Low        NAB Language Module, Form 1: T Score Percentile     Naming 24/31 (22) <1 Exceptionally Low       Visuospatial/Visuoconstruction:      Raw Score Percentile   Clock Drawing: 4/10 --- Impaired       Mood and Personality:      Raw Score Percentile   Geriatric Depression Scale: 10 --- Mild  PROMIS Anxiety Questionnaire: 14 --- None to Slight       Additional Questionnaires:      Raw Score Percentile   PROMIS Sleep Disturbance Questionnaire: 22 --- None to Slight   Informed Consent and Coding/Compliance:   The current evaluation represents a clinical evaluation for the purposes previously outlined by the  referral source and is in no way reflective of a forensic evaluation.   Ms. Koning was provided with a verbal description of the nature and purpose of the present neuropsychological evaluation. Also reviewed were the foreseeable risks and/or discomforts and benefits of the procedure, limits of confidentiality, and mandatory reporting requirements of this provider. The patient was given the opportunity to ask questions and receive answers about the evaluation. Oral consent to participate was provided by the patient.   This evaluation was conducted by Christia Reading, Ph.D., ABPP-CN, board certified clinical neuropsychologist. Ms. Peden completed 110 minutes of cognitive testing and scoring, billed as one unit [phone] and three additional units [id number]. Psychometrist Milana Kidney, B.S., assisted Dr. Melvyn Novas with test administration and scoring procedures. As a separate and discrete service, Dr. Melvyn Novas spent a total of 160 minutes in interpretation and report writing billed as one unit [phone] and two units [id number].

## 2021-01-16 ENCOUNTER — Encounter: Payer: Self-pay | Admitting: Psychology

## 2021-01-18 ENCOUNTER — Ambulatory Visit (INDEPENDENT_AMBULATORY_CARE_PROVIDER_SITE_OTHER): Payer: Medicare PPO | Admitting: Psychology

## 2021-01-18 ENCOUNTER — Other Ambulatory Visit: Payer: Self-pay

## 2021-01-18 ENCOUNTER — Encounter: Payer: Medicare PPO | Admitting: Psychology

## 2021-01-18 DIAGNOSIS — F0280 Dementia in other diseases classified elsewhere without behavioral disturbance: Secondary | ICD-10-CM

## 2021-01-18 DIAGNOSIS — F01A0 Vascular dementia, mild, without behavioral disturbance, psychotic disturbance, mood disturbance, and anxiety: Secondary | ICD-10-CM

## 2021-01-18 DIAGNOSIS — G894 Chronic pain syndrome: Secondary | ICD-10-CM

## 2021-01-18 DIAGNOSIS — G309 Alzheimer's disease, unspecified: Secondary | ICD-10-CM

## 2021-01-18 NOTE — Progress Notes (Signed)
   Neuropsychology Feedback Session Kristine Stafford. [facility] of Neurology  Reason for Referral:   Kristine Stafford is a 70 y.o. right-handed Caucasian female referred by Sharene Butters, PA-C , to characterize her current cognitive functioning and assist with diagnostic clarity and treatment planning in the context of subjective cognitive decline.   Feedback:   Kristine Stafford completed a comprehensive neuropsychological evaluation on 01/15/2021. Please refer to that encounter for the full report and recommendations. Briefly, results suggested diffuse, severe cognitive impairment with performances commonly scoring in the exceptionally low normative range. She did reasonably well on an isolated line orientation task and exhibited some performance variability across processing speed and confrontation naming. However, in the case of the former two domains, performance was below expectation overall. Performance was consistently impaired across domains of basic attention, cognitive flexibility, safety/judgment, receptive language, verbal fluency, visuoconstructional abilities, and all aspects of learning and memory. Unfortunately, the most likely etiology at the present time is Alzheimer's disease. When initially learning novel information, Kristine Stafford did not benefit from repeated learning trials. After a brief delay, she was fully amnestic (i.e., 0% retention) across all three memory tasks. She further displayed poor performance across yes/no recognition trials. Taken together, this suggests a severe memory storage deficit, which is the hallmark characteristic of Alzheimer's disease.  Kristine Stafford was accompanied by her significant other [name] during the current feedback session. Content of the current session focused on the results of her neuropsychological evaluation. Kristine Stafford was given the opportunity to ask questions and her questions were answered. She was encouraged to reach  out should additional questions arise. A copy of her report was provided at the conclusion of the visit.      25 minutes were spent conducting the current feedback session with Kristine Stafford, billed as one unit [phone].

## 2021-01-22 ENCOUNTER — Ambulatory Visit: Payer: Medicare PPO | Admitting: Physician Assistant

## 2021-01-22 ENCOUNTER — Encounter: Payer: Self-pay | Admitting: Physician Assistant

## 2021-01-22 ENCOUNTER — Other Ambulatory Visit: Payer: Self-pay

## 2021-01-22 VITALS — BP 102/60 | HR 103 | Ht 64.0 in | Wt 117.4 lb

## 2021-01-22 DIAGNOSIS — F0280 Dementia in other diseases classified elsewhere without behavioral disturbance: Secondary | ICD-10-CM

## 2021-01-22 DIAGNOSIS — G309 Alzheimer's disease, unspecified: Secondary | ICD-10-CM | POA: Diagnosis not present

## 2021-01-22 MED ORDER — DONEPEZIL HCL 10 MG PO TABS: ORAL_TABLET | ORAL | 11 refills | Status: DC

## 2021-01-22 NOTE — Progress Notes (Signed)
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  Discussed the importance of regular daily schedule with inclusion of crossword puzzles to maintain brain function.  Continue to monitor mood by PCP.  Stay active at least 30 minutes at least 3 times a week.  Naps should be scheduled and should be no longer than 60 minutes and should not occur after 2 PM.  Mediterranean diet is recommended  Start Donepezil 10 mg :Take half tablet (5 mg) daily for 2 weeks, then increase to the full tablet at 10 mg daily. Side effects discussed   Recommend to take B12 supplements  Follow up in  6  months.   Case discussed with Dr. Delice Stafford who agrees with the plan     Subjective:    Kristine Stafford is a very pleasant 70 y.o. RH female with a history of migraines, hypothyroidism, hyperlipidemia, hypertension seen today  in follow up for evaluation of memory loss.  She was last seen on 09/05/20 at which time MoCA was 15/30.  MRI brain on 09/20/20 with mild cerebral volume loss and very mild ischemic changes. Neuropsych evaluation on 11/23/20 yielded a diagnosis of Major Neurocognitive Disorder (Dementia ) due to Alzheimer's Disease .  This patient is accompanied in the office by her boyfriend Kristine Stafford who supplements the history.  Previous records as well as any outside records available were reviewed prior to todays visit.    She reports that her memory may slightly be worse than on her initial visit.  She continues to repeat the same stories and asked the same questions, Kristine Stafford agrees.  She denies leaving objects in unusual places.  Upon learning that she has Alzheimer's disease, she feels slightly "down".  She confided in a friend and she is about to "break the news ", to her family and other friends, which brings some uneasiness and situational depression.  She tries to stay active, she has 5 acres of  land, walking around her property as often as she can.  She denies irritability or other mood changes.  She admits to not having been doing as many crossword puzzles or word finding as she should.  She sleeps "okay, but I wake up sometimes, I am going to try melatonin ".  Kristine Stafford states that she has a tendency to sleep after dinner and then she has trouble to stay asleep at night.  She denies sleepwalking, or vivid dreams, REM behavior.  No hallucinations or paranoia are reported.  No hygiene concerns, she is independent of bathing and dressing.  Kristine Stafford is in charge of the medications, finances and driving.  Her appetite is good, she cooks and denies leaving the stove on, she denies trouble swallowing.  She has left hip arthritis, and has been receiving epidural injections, and she is due for the second injection soon for left hip pain.  She denies any falls or head injuries.  No recent hospitalizations.  She denies any headache, double vision, dizziness, focal numbness, tingling, unilateral weakness, tremors or anosmia.  She denies urine incontinence, retention, constipation or diarrhea.    Initial Evaluation 09/05/20 The patient is seen in neurologic consultation at the request of Kristine Prose, MD for the evaluation of memory.  The patient is accompanied by boyfriend Kristine Stafford who supplements the history. She is a 70 y.o. year old female who has had memory issues for about  1 year where  she has noticed worsening short term recall issues. Kristine Stafford reports that she is increasingly asking the same questions and repeating the same stories. " She cannot retain new information". She lives with Kristine Stafford for the last 40 years. He noticed increased hoarding old objects and she adds "this is clogging my life". "She appears more stressed than depressed and Covid pandemic adds to it "-he says. Denies irritability. Her sleep pattern is "erratic", goes to sleep late and wakes up during the night, but she is trying to change her schedule to go to  sleep at the same time. Denies acting out in dreams or sleepwalking. Denies hallucinations or paranoia. She is independent of dressing and bathing. Kristine Stafford manages her medications because she has a tendency to forget. He also manages the bill because she has missed some payments and he does the driving after she could not remember how to get from the pharmacy to the grocery store. A few months back, she made a trip to [place] and took the wrong turn, so she voluntarily surrendered the driving. She cooks occasionally, denies leaving the stove or faucet on. Appetite is good and denies trouble swallowing.  Denies leaving objects in unusual places. Ambulates without difficulty without a cane or walker.  Denies headaches or  falls. She had injury to the head at home, with a folding table about 1 y ago, without LOC. No visit to the ER or imaging was reported. Denies double vision, dizziness, focal numbness or tingling, unilateral weakness or tremors. Denies urine incontinence or retention. Denies constipation or diarrhea.  Denies anosmia. Denies history of OSA, ETOH  or Tobacco. Family History Dad dementia 90s AD. College education. Former Licensed conveyancer.   Brain MRI on 09/20/2020 revealed generalized cerebral volume loss of unspecified severity, as well as less than typical small vessel ischemic changes for her age.   Neurocognitive Exam 11/23/20 Briefly, results suggested diffuse, severe cognitive impairment with performances commonly scoring in the exceptionally low normative range. She did reasonably well on an isolated line orientation task and exhibited some performance variability across processing speed and confrontation naming. However, in the case of the former two domains, performance was below expectation overall. Performance was consistently impaired across domains of basic attention, cognitive flexibility, safety/judgment, receptive language, verbal fluency, visuoconstructional abilities, and all aspects of  learning and memory. Unfortunately, the most likely etiology at the present time is Alzheimer's disease. When initially learning novel information, Ms. Hable did not benefit from repeated learning trials. After a brief delay, she was fully amnestic (i.e., 0% retention) across all three memory tasks. She further displayed poor performance across yes/no recognition trials. Taken together, this suggests a severe memory storage deficit, which is the hallmark characteristic of Alzheimer's disease.  TSH 09/05/20 3.44 and B12 228    PREVIOUS MEDICATIONS: none  CURRENT MEDICATIONS:  Outpatient Encounter Medications as of 01/22/2021  Medication Sig   acetaminophen (TYLENOL) 325 MG tablet Take 650 mg by mouth every 6 (six) hours as needed.   alendronate (FOSAMAX) 70 MG tablet Take 70 mg by mouth once a week. Take with a full glass of water on an empty stomach.   ALPRAZolam (NIRAVAM) 0.5 MG dissolvable tablet Take by mouth.   atorvastatin (LIPITOR) 20 MG tablet 1 tablet   calcium citrate-vitamin D (CITRACAL+D) 315-200 MG-UNIT tablet Take 1 tablet by mouth 2 (two) times daily.   donepezil (ARICEPT) 10 MG tablet Take half tablet (5 mg) daily for 2 weeks, then increase to the full tablet at 10 mg daily  meloxicam (MOBIC) 15 MG tablet Take 15 mg by mouth daily as needed.   predniSONE (STERAPRED UNI-PAK 21 TAB) 10 MG (21) TBPK tablet Take by mouth.   traMADol (ULTRAM) 50 MG tablet Take 50 mg by mouth 3 (three) times daily.   VALIUM 5 MG tablet Take 5 mg by mouth once.   MEDROL 4 MG TBPK tablet Take by mouth. (Patient not taking: Reported on 01/22/2021)   No facility-administered encounter medications on file as of 01/22/2021.     Objective:     PHYSICAL EXAMINATION:    VITALS:   Vitals:   01/22/21 0944  BP: 102/60  Pulse: (!) 103  SpO2: 97%  Weight: 117 lb 6.4 oz (53.3 kg)  Height: 5\' 4"  (1.626 m)    GEN:  The patient appears stated age and is in NAD. HEENT:  Normocephalic, atraumatic.    Neurological examination:  General: NAD, well-groomed, appears stated age.  Mildly anxious. Orientation: The patient is alert. Oriented to person, place and not to date Cranial nerves: There is good facial symmetry.The speech is fluent and clear. No aphasia or dysarthria. Fund of knowledge is appropriate. Recent memory impaired and remote memory intact.  Attention and concentration are normal.  Able to name objects and repeat phrases.  Hearing is intact to conversational tone.    Sensation: Sensation is intact to light touch throughout Motor: Strength is at least antigravity x4. Tremors: none  DTR's 2/4 in [facility]  09/05/2020  Visuospatial/ Executive (0/5) 3  Naming (0/3) 3  Attention: Read list of digits (0/2) 2  Attention: Read list of letters (0/1) 1  Attention: Serial 7 subtraction starting at 100 (0/3) 0  Language: Repeat phrase (0/2) 0  Language : Fluency (0/1) 1  Abstraction (0/2) 1  Delayed Recall (0/5) 0  Orientation (0/6) 4  Total 15  Adjusted Score (based on education) 15   No flowsheet data found.  No flowsheet data found.     Movement examination: Tone: There is normal tone in the UE/LE Abnormal movements:  no tremor.  No myoclonus.  No asterixis.   Coordination:  There is no decremation with RAM's. Normal finger to nose  Gait and Station: The patient has no difficulty arising out of a deep-seated chair without the use of the hands. The patient's stride length is good.  Gait is cautious and narrow.        Total time spent on today's visit was 40 minutes, including both face-to-face time and nonface-to-face time. Time included that spent on review of records (prior notes available to me/labs/imaging if pertinent), discussing treatment and goals, answering patient's questions and coordinating care.  Cc:  Kristine Prose, MD Sharene Butters, PA-C

## 2021-01-22 NOTE — Patient Instructions (Addendum)
It was a pleasure to see you today at our office.   Recommendations:  Meds: Follow up in  6 months We will start donepezil half tablet (5mg ) daily for 2  weeks.  If you are tolerating the medication, then after 2 weeks, we will increase the dose to a full tablet of 10 mg daily.  Side effects include nausea, vomiting, diarrhea, vivid dreams, and muscle cramps.  Please call the clinic if you experience any of these symptoms.    RECOMMENDATIONS FOR ALL PATIENTS WITH MEMORY PROBLEMS: 1. Continue to exercise (Recommend 30 minutes of walking everyday, or 3 hours every week) 2. Increase social interactions - continue going to [place] and enjoy social gatherings with friends and family 3. Eat healthy, avoid fried foods and eat more fruits and vegetables 4. Maintain adequate blood pressure, blood sugar, and blood cholesterol level. Reducing the risk of stroke and cardiovascular disease also helps promoting better memory. 5. Avoid stressful situations. Live a simple life and avoid aggravations. Organize your time and prepare for the next day in anticipation. 6. Sleep well, avoid any interruptions of sleep and avoid any distractions in the bedroom that may interfere with adequate sleep quality 7. Avoid sugar, avoid sweets as there is a strong link between excessive sugar intake, diabetes, and cognitive impairment We discussed the Mediterranean diet, which has been shown to help patients reduce the risk of progressive memory disorders and reduces cardiovascular risk. This includes eating fish, eat fruits and green leafy vegetables, nuts like almonds and hazelnuts, walnuts, and also use olive oil. Avoid fast foods and fried foods as much as possible. Avoid sweets and sugar as sugar use has been linked to worsening of memory function.  There is always a concern of gradual progression of memory problems. If this is the case, then we may need to adjust level of care according to patient needs. Support, both to the  patient and caregiver, should then be put into place.    The Alzheimer's Association is here all day, every day for people facing Alzheimer's disease through our free 24/7 Helpline: [phone]. The Helpline provides reliable information and support to all those who need assistance, such as individuals living with memory loss, Alzheimer's or other dementia, caregivers, health care professionals and the public.  Our highly trained and knowledgeable staff can help you with: Understanding memory loss, dementia and Alzheimer's  Medications and other treatment options  General information about aging and brain health  Skills to provide quality care and to find the best care from professionals  Legal, financial and living-arrangement decisions Our Helpline also features: Confidential care consultation provided by master's level clinicians who can help with decision-making support, crisis assistance and education on issues families face every day  Help in a caller's preferred language using our translation service that features more than 200 languages and dialects  Referrals to local community programs, services and ongoing support     FALL PRECAUTIONS: Be cautious when walking. Scan the area for obstacles that may increase the risk of trips and falls. When getting up in the mornings, sit up at the edge of the bed for a few minutes before getting out of bed. Consider elevating the bed at the head end to avoid drop of blood pressure when getting up. Walk always in a well-lit room (use night lights in the walls). Avoid area rugs or power cords from appliances in the middle of the walkways. Use a walker or a cane if necessary and consider physical therapy  for balance exercise. Get your eyesight checked regularly.  FINANCIAL OVERSIGHT: Supervision, especially oversight when making financial decisions or transactions is also recommended.  HOME SAFETY: Consider the safety of the kitchen when operating  appliances like stoves, microwave oven, and blender. Consider having supervision and share cooking responsibilities until no longer able to participate in those. Accidents with firearms and other hazards in the house should be identified and addressed as well.   ABILITY TO BE LEFT ALONE: If patient is unable to contact 911 operator, consider using LifeLine, or when the need is there, arrange for someone to stay with patients. Smoking is a fire hazard, consider supervision or cessation. Risk of wandering should be assessed by caregiver and if detected at any point, supervision and safe proof recommendations should be instituted.  MEDICATION SUPERVISION: Inability to self-administer medication needs to be constantly addressed. Implement a mechanism to ensure safe administration of the medications.       Mediterranean Diet A Mediterranean diet refers to food and lifestyle choices that are based on the traditions of countries located on the [employer]. This way of eating has been shown to help prevent certain conditions and improve outcomes for people who have chronic diseases, like kidney disease and heart disease. What are tips for following this plan? Lifestyle  Cook and eat meals together with your family, when possible. Drink enough fluid to keep your urine clear or pale yellow. Be physically active every day. This includes: Aerobic exercise like running or swimming. Leisure activities like gardening, walking, or housework. Get 7-8 hours of sleep each night. If recommended by your health care provider, drink red wine in moderation. This means 1 glass a day for nonpregnant women and 2 glasses a day for men. A glass of wine equals 5 oz (150 mL). Reading food labels  Check the serving size of packaged foods. For foods such as rice and pasta, the serving size refers to the amount of cooked product, not dry. Check the total fat in packaged foods. Avoid foods that have saturated fat or  trans fats. Check the ingredients list for added sugars, such as corn syrup. Shopping  At the grocery store, buy most of your food from the areas near the walls of the store. This includes: Fresh fruits and vegetables (produce). Grains, beans, nuts, and seeds. Some of these may be available in unpackaged forms or large amounts (in bulk). Fresh seafood. Poultry and eggs. Low-fat dairy products. Buy whole ingredients instead of prepackaged foods. Buy fresh fruits and vegetables in-season from local farmers markets. Buy frozen fruits and vegetables in resealable bags. If you do not have access to quality fresh seafood, buy precooked frozen shrimp or canned fish, such as tuna, salmon, or sardines. Buy small amounts of raw or cooked vegetables, salads, or olives from the deli or salad bar at your store. Stock your pantry so you always have certain foods on hand, such as olive oil, canned tuna, canned tomatoes, rice, pasta, and beans. Cooking  Cook foods with extra-virgin olive oil instead of using butter or other vegetable oils. Have meat as a side dish, and have vegetables or grains as your main dish. This means having meat in small portions or adding small amounts of meat to foods like pasta or stew. Use beans or vegetables instead of meat in common dishes like chili or lasagna. Experiment with different cooking methods. Try roasting or broiling vegetables instead of steaming or sauteing them. Add frozen vegetables to soups, stews, pasta, or rice.  Add nuts or seeds for added healthy fat at each meal. You can add these to yogurt, salads, or vegetable dishes. Marinate fish or vegetables using olive oil, lemon juice, garlic, and fresh herbs. Meal planning  Plan to eat 1 vegetarian meal one day each week. Try to work up to 2 vegetarian meals, if possible. Eat seafood 2 or more times a week. Have healthy snacks readily available, such as: Vegetable sticks with hummus. Greek yogurt. Fruit and  nut trail mix. Eat balanced meals throughout the week. This includes: Fruit: 2-3 servings a day Vegetables: 4-5 servings a day Low-fat dairy: 2 servings a day Fish, poultry, or lean meat: 1 serving a day Beans and legumes: 2 or more servings a week Nuts and seeds: 1-2 servings a day Whole grains: 6-8 servings a day Extra-virgin olive oil: 3-4 servings a day Limit red meat and sweets to only a few servings a month What are my food choices? Mediterranean diet Recommended Grains: Whole-grain pasta. Brown rice. Bulgar wheat. Polenta. Couscous. Whole-wheat bread. [place]. Vegetables: Artichokes. Beets. Broccoli. Cabbage. Carrots. Eggplant. Green beans. Chard. Kale. Spinach. Onions. Leeks. Peas. Squash. Tomatoes. Peppers. Radishes. Fruits: Apples. Apricots. Avocado. Berries. Bananas. Cherries. Dates. Figs. Grapes. Lemons. Melon. Oranges. Peaches. Plums. Pomegranate. Meats and other protein foods: Beans. Almonds. Sunflower seeds. Pine nuts. Peanuts. [place]. Salmon. Scallops. Shrimp. [place]. Tilapia. Clams. Oysters. Eggs. Dairy: Low-fat milk. Cheese. Greek yogurt. Beverages: Water. Red wine. Herbal tea. Fats and oils: Extra virgin olive oil. Avocado oil. Grape seed oil. Sweets and desserts: [place] yogurt with honey. Baked apples. Poached pears. Trail mix. Seasoning and other foods: Basil. Cilantro. Coriander. Cumin. Mint. Parsley. Sage. Rosemary. Tarragon. Garlic. Oregano. Thyme. Pepper. Balsalmic vinegar. Tahini. Hummus. Tomato sauce. Olives. Mushrooms. Limit these Grains: Prepackaged pasta or rice dishes. Prepackaged cereal with added sugar. Vegetables: Deep fried potatoes (french fries). Fruits: Fruit canned in syrup. Meats and other protein foods: Beef. Pork. Lamb. Poultry with skin. Hot dogs. [place] Salines. Dairy: Ice cream. Sour cream. Whole milk. Beverages: Juice. Sugar-sweetened soft drinks. Beer. Liquor and spirits. Fats and oils: Butter. Canola oil. Vegetable oil. Beef fat (tallow).  Lard. Sweets and desserts: Cookies. Cakes. Pies. Candy. Seasoning and other foods: Mayonnaise. Premade sauces and marinades. The items listed may not be a complete list. Talk with your dietitian about what dietary choices are right for you. Summary The Mediterranean diet includes both food and lifestyle choices. Eat a variety of fresh fruits and vegetables, beans, nuts, seeds, and whole grains. Limit the amount of red meat and sweets that you eat. Talk with your health care provider about whether it is safe for you to drink red wine in moderation. This means 1 glass a day for nonpregnant women and 2 glasses a day for men. A glass of wine equals 5 oz (150 mL). This information is not intended to replace advice given to you by your health care provider. Make sure you discuss any questions you have with your health care provider. Document Released: 09/21/2015 Document Revised: 10/24/2015 Document Reviewed: 09/21/2015 Elsevier Interactive Patient Education  2017 [employer].

## 2021-03-16 DIAGNOSIS — M5416 Radiculopathy, lumbar region: Secondary | ICD-10-CM | POA: Diagnosis not present

## 2021-03-26 DIAGNOSIS — M5416 Radiculopathy, lumbar region: Secondary | ICD-10-CM | POA: Diagnosis not present

## 2021-04-11 DIAGNOSIS — M5416 Radiculopathy, lumbar region: Secondary | ICD-10-CM | POA: Diagnosis not present

## 2021-04-17 DIAGNOSIS — M5416 Radiculopathy, lumbar region: Secondary | ICD-10-CM | POA: Diagnosis not present

## 2021-05-02 DIAGNOSIS — M5416 Radiculopathy, lumbar region: Secondary | ICD-10-CM | POA: Diagnosis not present

## 2021-05-14 DIAGNOSIS — M5416 Radiculopathy, lumbar region: Secondary | ICD-10-CM | POA: Diagnosis not present

## 2021-06-01 DIAGNOSIS — M5416 Radiculopathy, lumbar region: Secondary | ICD-10-CM | POA: Diagnosis not present

## 2021-06-13 DIAGNOSIS — M5416 Radiculopathy, lumbar region: Secondary | ICD-10-CM | POA: Diagnosis not present

## 2021-06-20 DIAGNOSIS — Z1331 Encounter for screening for depression: Secondary | ICD-10-CM | POA: Diagnosis not present

## 2021-06-20 DIAGNOSIS — Z0000 Encounter for general adult medical examination without abnormal findings: Secondary | ICD-10-CM | POA: Diagnosis not present

## 2021-06-20 DIAGNOSIS — M810 Age-related osteoporosis without current pathological fracture: Secondary | ICD-10-CM | POA: Diagnosis not present

## 2021-06-20 DIAGNOSIS — G309 Alzheimer's disease, unspecified: Secondary | ICD-10-CM | POA: Diagnosis not present

## 2021-06-20 DIAGNOSIS — E785 Hyperlipidemia, unspecified: Secondary | ICD-10-CM | POA: Diagnosis not present

## 2021-06-20 DIAGNOSIS — Z23 Encounter for immunization: Secondary | ICD-10-CM | POA: Diagnosis not present

## 2021-06-25 DIAGNOSIS — M5416 Radiculopathy, lumbar region: Secondary | ICD-10-CM | POA: Diagnosis not present

## 2021-07-17 DIAGNOSIS — M5451 Vertebrogenic low back pain: Secondary | ICD-10-CM | POA: Diagnosis not present

## 2021-07-23 ENCOUNTER — Encounter: Payer: Self-pay | Admitting: Physician Assistant

## 2021-07-23 ENCOUNTER — Ambulatory Visit: Payer: Medicare PPO | Admitting: Physician Assistant

## 2021-07-23 VITALS — BP 114/74 | HR 74 | Resp 20 | Ht 62.0 in | Wt 121.0 lb

## 2021-07-23 DIAGNOSIS — G309 Alzheimer's disease, unspecified: Secondary | ICD-10-CM

## 2021-07-23 DIAGNOSIS — F0280 Dementia in other diseases classified elsewhere without behavioral disturbance: Secondary | ICD-10-CM | POA: Diagnosis not present

## 2021-07-23 MED ORDER — MEMANTINE HCL 5 MG PO TABS: ORAL_TABLET | ORAL | 11 refills | Status: DC

## 2021-07-23 MED ORDER — DONEPEZIL HCL 10 MG PO TABS: ORAL_TABLET | ORAL | 11 refills | Status: DC

## 2021-07-23 NOTE — Patient Instructions (Addendum)
It was a pleasure to see you today at our office.   Recommendations:  Follow up in  6 months Continue donepezil 10 mg daily.  Start Memantine 5 mg tablets.  Take 1 tablet at bedtime for 2 weeks, then 1 tablet twice daily.   Side effects include dizziness, headache, constipation.   Repeat neurocognitive testing   Whom to call:  Memory  decline, memory medications: Call our office [phone]   For psychiatric meds, mood meds: Please have your primary care physician manage these medications.   Counseling regarding caregiver distress, including caregiver depression, anxiety and issues regarding community resources, adult day care programs, adult living facilities, or memory care questions:   Feel free to contact [place], Social Worker at [phone]   For assessment of decision of mental capacity and competency:  Call Dr. Anthoney Harada, geriatric psychiatrist at [phone]  For guidance in geriatric dementia issues please call [facility] [phone]  For guidance regarding [facility] and if placement were needed at the facility, contact Arnell Asal, Social Worker tel: [phone]  [facility]  [place], [place] [address] [phone]  Hours of Operation Mondays to Thursdays: 8 am to 8 pm,Fridays: 9 am to 8 pm, Saturdays: 9 am to 1 pm Sundays: Closed  [email or web address]/departments/parks-recreation/active-adults-50/[facility]   If you have any severe symptoms of a stroke, or other severe issues such as confusion,severe chills or fever, etc call 911 or go to the ER as you may need to be evaluated further   Feel free to visit Facebook page " [employer]" for tips of how to care for people with memory problems.   Feel free to go to the following database for funded clinical studies conducted around the world: http://saunders.com/   https://www.triadclinicaltrials.com/      RECOMMENDATIONS FOR ALL PATIENTS WITH MEMORY PROBLEMS: 1. Continue to exercise (Recommend 30 minutes of walking everyday, or 3 hours every week) 2. Increase social interactions - continue going to [place] and enjoy social gatherings with friends and family 3. Eat healthy, avoid fried foods and eat more fruits and vegetables 4. Maintain adequate blood pressure, blood sugar, and blood cholesterol level. Reducing the risk of stroke and cardiovascular disease also helps promoting better memory. 5. Avoid stressful situations. Live a simple life and avoid aggravations. Organize your time and prepare for the next day in anticipation. 6. Sleep well, avoid any interruptions of sleep and avoid any distractions in the bedroom that may interfere with adequate sleep quality 7. Avoid sugar, avoid sweets as there is a strong link between excessive sugar intake, diabetes, and cognitive impairment We discussed the Mediterranean diet, which has been shown to help patients reduce the risk of progressive memory disorders and reduces cardiovascular risk. This includes eating fish, eat fruits and green leafy vegetables, nuts like almonds and hazelnuts, walnuts, and also use olive oil. Avoid fast foods and fried foods as much as possible. Avoid sweets and sugar as sugar use has been linked to worsening of memory function.  There is always a concern of gradual progression of memory problems. If this is the case, then we may need to adjust level of care according to patient needs. Support, both to the patient and caregiver, should then be put into place.    The Alzheimer's Association is here all day, every day for people facing Alzheimer's disease through our free 24/7 Helpline: 832-802-0384. The Helpline provides reliable information and support to all those who need assistance, such as  individuals living with memory loss, Alzheimer's or other dementia, caregivers, health care professionals and the public.  Our highly  trained and knowledgeable staff can help you with: Understanding memory loss, dementia and Alzheimer's  Medications and other treatment options  General information about aging and brain health  Skills to provide quality care and to find the best care from professionals  Legal, financial and living-arrangement decisions Our Helpline also features: Confidential care consultation provided by master's level clinicians who can help with decision-making support, crisis assistance and education on issues families face every day  Help in a caller's preferred language using our translation service that features more than 200 languages and dialects  Referrals to local community programs, services and ongoing support     FALL PRECAUTIONS: Be cautious when walking. Scan the area for obstacles that may increase the risk of trips and falls. When getting up in the mornings, sit up at the edge of the bed for a few minutes before getting out of bed. Consider elevating the bed at the head end to avoid drop of blood pressure when getting up. Walk always in a well-lit room (use night lights in the walls). Avoid area rugs or power cords from appliances in the middle of the walkways. Use a walker or a cane if necessary and consider physical therapy for balance exercise. Get your eyesight checked regularly.  FINANCIAL OVERSIGHT: Supervision, especially oversight when making financial decisions or transactions is also recommended.  HOME SAFETY: Consider the safety of the kitchen when operating appliances like stoves, microwave oven, and blender. Consider having supervision and share cooking responsibilities until no longer able to participate in those. Accidents with firearms and other hazards in the house should be identified and addressed as well.   ABILITY TO BE LEFT ALONE: If patient is unable to contact 911 operator, consider using LifeLine, or when the need is there, arrange for someone to stay with patients.  Smoking is a fire hazard, consider supervision or cessation. Risk of wandering should be assessed by caregiver and if detected at any point, supervision and safe proof recommendations should be instituted.  MEDICATION SUPERVISION: Inability to self-administer medication needs to be constantly addressed. Implement a mechanism to ensure safe administration of the medications.         Mediterranean Diet A Mediterranean diet refers to food and lifestyle choices that are based on the traditions of countries located on the [employer]. This way of eating has been shown to help prevent certain conditions and improve outcomes for people who have chronic diseases, like kidney disease and heart disease. What are tips for following this plan? Lifestyle  Cook and eat meals together with your family, when possible. Drink enough fluid to keep your urine clear or pale yellow. Be physically active every day. This includes: Aerobic exercise like running or swimming. Leisure activities like gardening, walking, or housework. Get 7-8 hours of sleep each night. If recommended by your health care provider, drink red wine in moderation. This means 1 glass a day for nonpregnant women and 2 glasses a day for men. A glass of wine equals 5 oz (150 mL). Reading food labels  Check the serving size of packaged foods. For foods such as rice and pasta, the serving size refers to the amount of cooked product, not dry. Check the total fat in packaged foods. Avoid foods that have saturated fat or trans fats. Check the ingredients list for added sugars, such as corn syrup. Shopping  At [employer],  buy most of your food from the areas near the walls of the store. This includes: Fresh fruits and vegetables (produce). Grains, beans, nuts, and seeds. Some of these may be available in unpackaged forms or large amounts (in bulk). Fresh seafood. Poultry and eggs. Low-fat dairy products. Buy whole ingredients  instead of prepackaged foods. Buy fresh fruits and vegetables in-season from local farmers markets. Buy frozen fruits and vegetables in resealable bags. If you do not have access to quality fresh seafood, buy precooked frozen shrimp or canned fish, such as tuna, salmon, or sardines. Buy small amounts of raw or cooked vegetables, salads, or olives from the deli or salad bar at your store. Stock your pantry so you always have certain foods on hand, such as olive oil, canned tuna, canned tomatoes, rice, pasta, and beans. Cooking  Cook foods with extra-virgin olive oil instead of using butter or other vegetable oils. Have meat as a side dish, and have vegetables or grains as your main dish. This means having meat in small portions or adding small amounts of meat to foods like pasta or stew. Use beans or vegetables instead of meat in common dishes like chili or lasagna. Experiment with different cooking methods. Try roasting or broiling vegetables instead of steaming or sauteing them. Add frozen vegetables to soups, stews, pasta, or rice. Add nuts or seeds for added healthy fat at each meal. You can add these to yogurt, salads, or vegetable dishes. Marinate fish or vegetables using olive oil, lemon juice, garlic, and fresh herbs. Meal planning  Plan to eat 1 vegetarian meal one day each week. Try to work up to 2 vegetarian meals, if possible. Eat seafood 2 or more times a week. Have healthy snacks readily available, such as: Vegetable sticks with hummus. Greek yogurt. Fruit and nut trail mix. Eat balanced meals throughout the week. This includes: Fruit: 2-3 servings a day Vegetables: 4-5 servings a day Low-fat dairy: 2 servings a day Fish, poultry, or lean meat: 1 serving a day Beans and legumes: 2 or more servings a week Nuts and seeds: 1-2 servings a day Whole grains: 6-8 servings a day Extra-virgin olive oil: 3-4 servings a day Limit red meat and sweets to only a few servings a  month What are my food choices? Mediterranean diet Recommended Grains: Whole-grain pasta. Brown rice. Bulgar wheat. Polenta. Couscous. Whole-wheat bread. [place]. Vegetables: Artichokes. Beets. Broccoli. Cabbage. Carrots. Eggplant. Green beans. Chard. Kale. Spinach. Onions. Leeks. Peas. Squash. Tomatoes. Peppers. Radishes. Fruits: Apples. Apricots. Avocado. Berries. Bananas. Cherries. Dates. Figs. Grapes. Lemons. Melon. Oranges. Peaches. Plums. Pomegranate. Meats and other protein foods: Beans. Almonds. Sunflower seeds. Pine nuts. Peanuts. [place]. Salmon. Scallops. Shrimp. [place]. Tilapia. Clams. Oysters. Eggs. Dairy: Low-fat milk. Cheese. Greek yogurt. Beverages: Water. Red wine. Herbal tea. Fats and oils: Extra virgin olive oil. Avocado oil. Grape seed oil. Sweets and desserts: [place] yogurt with honey. Baked apples. Poached pears. Trail mix. Seasoning and other foods: Basil. Cilantro. Coriander. Cumin. Mint. Parsley. Sage. Rosemary. Tarragon. Garlic. Oregano. Thyme. Pepper. Balsalmic vinegar. Tahini. Hummus. Tomato sauce. Olives. Mushrooms. Limit these Grains: Prepackaged pasta or rice dishes. Prepackaged cereal with added sugar. Vegetables: Deep fried potatoes (french fries). Fruits: Fruit canned in syrup. Meats and other protein foods: Beef. Pork. Lamb. Poultry with skin. Hot dogs. Berniece Salines. Dairy: Ice cream. Sour cream. Whole milk. Beverages: Juice. Sugar-sweetened soft drinks. Beer. Liquor and spirits. Fats and oils: Butter. Canola oil. Vegetable oil. Beef fat (tallow). Lard. Sweets and desserts: Cookies. Cakes. Pies. Candy. Seasoning  and other foods: Mayonnaise. Premade sauces and marinades. The items listed may not be a complete list. Talk with your dietitian about what dietary choices are right for you. Summary The Mediterranean diet includes both food and lifestyle choices. Eat a variety of fresh fruits and vegetables, beans, nuts, seeds, and whole grains. Limit the amount of red  meat and sweets that you eat. Talk with your health care provider about whether it is safe for you to drink red wine in moderation. This means 1 glass a day for nonpregnant women and 2 glasses a day for men. A glass of wine equals 5 oz (150 mL). This information is not intended to replace advice given to you by your health care provider. Make sure you discuss any questions you have with your health care provider. Document Released: 09/21/2015 Document Revised: 10/24/2015 Document Reviewed: 09/21/2015 Elsevier Interactive Patient Education  2017 [employer].

## 2021-07-23 NOTE — Progress Notes (Signed)
Assessment/Plan:   Dementia likely due to Alzheimer's disease  Kristine Stafford is a very pleasant 71 y.o. RH female with a history of migraines, hypothyroidism, hyperlipidemia, hypertension seen today  in follow up for evaluation of memory loss.  She was last seen on 09/05/20 at which time MoCA was 15/30.  MRI brain on 09/20/20 with mild cerebral volume loss and very mild ischemic changes.  Neuropsych evaluation on 11/23/20 yielded a diagnosis of Major Neurocognitive Disorder (Dementia ) due to Alzheimer's Disease  Started on donepezil 10 mg daily, tolerating well. MMSE today is 15/30 with noted cognitive decline.   Recommendations:    Continue Donepezil 10 mg   Side effects were discussed Start Memantine 5 mg bid take 1 tab qhs for 2 weeks, then increase to 5 mg bid  Repeat Neuropsych evaluation for diagnostic clarity and trajectory of the disease Follow up in 6  months.   Case discussed with Dr.Jaffe who agrees with the plan    Subjective:    Kristine Stafford is a very pleasant 71 y.o. RH female  seen today in follow up for memory loss. This patient is accompanied in the office by her boyfriend [name] who supplements the history.  Previous records as well as any outside records available were reviewed prior to todays visit.   She is currently on donepezil 10 mg daily, tolerating well.    Any changes in memory since last visit?  She denies any issues, but [name] stats her STM is worse.  She has difficulties remembering the names of simple objects, or when trying to explain an event, she has more trouble with fluency than before.  During the day, she enjoys playing but nanogram, which she movies, and trying to do more word findings and crossword puzzles.  She also likes to walk around her property, she enjoys nature. Patient lives with boyfriend [name] repeats oneself? Endorsed by [name], "pretty much the same as before, not worse" Disoriented when walking into a room?  Patient denies    Leaving objects in unusual places?  Patient denies   Ambulates  with difficulty?   Patient denies   Recent falls?  Patient denies   Any head injuries?  Patient denies   History of seizures?   Patient denies   Wandering behavior?  Patient denies   Patient drives?   Patient no longer drives  Any mood changes such irritability agitation?  Patient denies    depression?:  Situational depression endorsed Hallucinations?  Patient denies   Paranoia?  Patient denies   Patient reports that he sleeps well without vivid dreams, REM behavior or sleepwalking. Takes melatonin "which helps some ". History of sleep apnea?  Patient denies   Any hygiene concerns?  Patient denies.  She continues to show cording behavior especially with old objects, without any other new behaviors seen. Independent of bathing and dressing?  Endorsed  Does the patient needs help with medications?  [name] is in charge  Who is in charge of the finances? [name] is in charge     Any changes in appetite?  Patient denies   Patient have trouble swallowing? Patient denies   Does the patient cook?  Patient cooks with [name] supervision Any kitchen accidents such as leaving the stove on? Patient denies   Any headaches?  Patient denies   The double vision? Patient denies   Any focal numbness or tingling?  Patient denies   Chronic back pain Endorsed, followed by orthopedics, tried PT and has not  been doing the exercises lately, she is going to start again soon.  "I know I should " Unilateral weakness?  Patient denies   Any tremors?  Patient denies   Any history of anosmia?  Patient denies   Any incontinence of urine?  Patient denies   Any bowel dysfunction?   Patient denies     Initial Evaluation 09/05/20 The patient is seen in neurologic consultation at the request of Donald Prose, MD for the evaluation of memory.  The patient is accompanied by boyfriend [name] who supplements the history. She is a 71 y.o. year old female who has had memory  issues for about  1 year where she has noticed worsening short term recall issues. [name] reports that she is increasingly asking the same questions and repeating the same stories. " She cannot retain new information". She lives with [name] for the last 40 years. He noticed increased hoarding old objects and she adds "this is clogging my life". "She appears more stressed than depressed and Covid pandemic adds to it "-he says. Denies irritability. Her sleep pattern is "erratic", goes to sleep late and wakes up during the night, but she is trying to change her schedule to go to sleep at the same time. Denies acting out in dreams or sleepwalking. Denies hallucinations or paranoia. She is independent of dressing and bathing. [name] manages her medications because she has a tendency to forget. He also manages the bill because she has missed some payments and he does the driving after she could not remember how to get from the pharmacy to the grocery store. A few months back, she made a trip to [place] and took the wrong turn, so she voluntarily surrendered the driving. She cooks occasionally, denies leaving the stove or faucet on. Appetite is good and denies trouble swallowing.  Denies leaving objects in unusual places. Ambulates without difficulty without a cane or walker.  Denies headaches or  falls. She had injury to the head at home, with a folding table about 1 y ago, without LOC. No visit to the ER or imaging was reported. Denies double vision, dizziness, focal numbness or tingling, unilateral weakness or tremors. Denies urine incontinence or retention. Denies constipation or diarrhea.  Denies anosmia. Denies history of OSA, ETOH  or Tobacco. Family History Dad dementia 90s AD. College education. Former Licensed conveyancer.    Brain MRI on 09/20/2020 revealed generalized cerebral volume loss of unspecified severity, as well as less than typical small vessel ischemic changes for her age.     Neurocognitive Exam 11/23/20 Briefly,  results suggested diffuse, severe cognitive impairment with performances commonly scoring in the exceptionally low normative range. She did reasonably well on an isolated line orientation task and exhibited some performance variability across processing speed and confrontation naming. However, in the case of the former two domains, performance was below expectation overall. Performance was consistently impaired across domains of basic attention, cognitive flexibility, safety/judgment, receptive language, verbal fluency, visuoconstructional abilities, and all aspects of learning and memory. Unfortunately, the most likely etiology at the present time is Alzheimer's disease. When initially learning novel information, Ms. Kristine Stafford did not benefit from repeated learning trials. After a brief delay, she was fully amnestic (i.e., 0% retention) across all three memory tasks. She further displayed poor performance across yes/no recognition trials. Taken together, this suggests a severe memory storage deficit, which is the hallmark characteristic of Alzheimer's disease.   TSH 09/05/20 3.44 and B12 228    PREVIOUS MEDICATIONS:   CURRENT MEDICATIONS:  Outpatient Encounter Medications as of 07/23/2021  Medication Sig   acetaminophen (TYLENOL) 325 MG tablet Take 650 mg by mouth every 6 (six) hours as needed.   alendronate (FOSAMAX) 70 MG tablet Take 70 mg by mouth once a week. Take with a full glass of water on an empty stomach.   ALPRAZolam (NIRAVAM) 0.5 MG dissolvable tablet Take by mouth.   atorvastatin (LIPITOR) 20 MG tablet 1 tablet   calcium citrate-vitamin D (CITRACAL+D) 315-200 MG-UNIT tablet Take 1 tablet by mouth 2 (two) times daily.   meloxicam (MOBIC) 15 MG tablet Take 15 mg by mouth daily as needed.   memantine (NAMENDA) 5 MG tablet Take 1 tablet (5 mg at night) for 2 weeks, then increase to 1 tablet (5 mg) twice a day   traMADol (ULTRAM) 50 MG tablet Take 50 mg by mouth 3 (three) times daily.   VALIUM  5 MG tablet Take 5 mg by mouth once.   [DISCONTINUED] donepezil (ARICEPT) 10 MG tablet Take half tablet (5 mg) daily for 2 weeks, then increase to the full tablet at 10 mg daily   donepezil (ARICEPT) 10 MG tablet Take 1 tablet at 10 mg daily   MEDROL 4 MG TBPK tablet Take by mouth. (Patient not taking: Reported on 07/23/2021)   predniSONE (STERAPRED UNI-PAK 21 TAB) 10 MG (21) TBPK tablet Take by mouth. (Patient not taking: Reported on 07/23/2021)   No facility-administered encounter medications on file as of 07/23/2021.       07/23/2021    8:00 PM  MMSE - Mini Mental State Exam  Orientation to time 1  Orientation to Place 3  Registration 2  Attention/ Calculation 2  Recall 0  Language- name 2 objects 1  Language- repeat 1  Language- follow 3 step command 3  Language- read & follow direction 1  Write a sentence 1  Copy design 0  Total score 15      09/05/2020   10:00 AM  Montreal Cognitive Assessment   Visuospatial/ Executive (0/5) 3  Naming (0/3) 3  Attention: Read list of digits (0/2) 2  Attention: Read list of letters (0/1) 1  Attention: Serial 7 subtraction starting at 100 (0/3) 0  Language: Repeat phrase (0/2) 0  Language : Fluency (0/1) 1  Abstraction (0/2) 1  Delayed Recall (0/5) 0  Orientation (0/6) 4  Total 15  Adjusted Score (based on education) 15    Objective:     PHYSICAL EXAMINATION:    VITALS:   Vitals:   07/23/21 0920  BP: 114/74  Pulse: 74  Resp: 20  SpO2: 95%  Weight: 121 lb (54.9 kg)  Height: '5\' 2"'$  (1.575 m)    GEN:  The patient appears stated age and is in NAD. HEENT:  Normocephalic, atraumatic.   Neurological examination:  General: NAD, well-groomed, appears stated age. Orientation: The patient is alert. Oriented to person, place, not to date.  Cranial nerves: There is good facial symmetry.The speech is less fluent than prior but clear.  No aphasia or dysarthria. Fund of knowledge is reduced. Recent and remote memory are impaired.  Attention and concentration are reduced.  Able to name objects and repeat phrases 1/2.  Hearing is intact to conversational tone.    Sensation: Sensation is intact to light touch throughout Motor: Strength is at least antigravity x4. Tremors: none  DTR's 2/4 in UE/LE     Movement examination: Tone: There is normal tone in the UE/LE Abnormal movements:  no tremor.  No myoclonus.  No asterixis.   Coordination:  There is no decremation with RAM's.  There is some difficulty with finger-to-nose, doing it correctly after the third trial. Gait and Station: The patient has no difficulty arising out of a deep-seated chair without the use of the hands. The patient's stride length is good.  Gait is cautious and narrow.    Thank you for allowing [place] the opportunity to participate in the care of this nice patient. Please do not hesitate to contact us for any questions or concerns.   Total time spent on today's visit was 40 minutes dedicated to this patient today, preparing to see patient, examining the patient, ordering tests and/or medications and counseling the patient, documenting clinical information in the EHR or other health record, independently interpreting results and communicating results to the patient/family, discussing treatment and goals, answering patient's questions and coordinating care.  Cc:  Donald Prose, MD  Sharene Butters 07/23/2021 8:22 PM

## 2021-07-25 DIAGNOSIS — H02423 Myogenic ptosis of bilateral eyelids: Secondary | ICD-10-CM | POA: Diagnosis not present

## 2021-07-25 DIAGNOSIS — H04123 Dry eye syndrome of bilateral lacrimal glands: Secondary | ICD-10-CM | POA: Diagnosis not present

## 2021-07-25 DIAGNOSIS — H25813 Combined forms of age-related cataract, bilateral: Secondary | ICD-10-CM | POA: Diagnosis not present

## 2021-07-25 DIAGNOSIS — H353132 Nonexudative age-related macular degeneration, bilateral, intermediate dry stage: Secondary | ICD-10-CM | POA: Diagnosis not present

## 2021-08-07 ENCOUNTER — Telehealth: Payer: Self-pay | Admitting: Physician Assistant

## 2021-08-07 NOTE — Telephone Encounter (Signed)
Left a message to call in am

## 2021-08-08 NOTE — Telephone Encounter (Signed)
Advised, will call office next week for update, thanked me for calling.

## 2021-08-16 DIAGNOSIS — R072 Precordial pain: Secondary | ICD-10-CM | POA: Diagnosis not present

## 2021-08-16 DIAGNOSIS — Z880 Allergy status to penicillin: Secondary | ICD-10-CM | POA: Diagnosis not present

## 2021-08-16 DIAGNOSIS — R079 Chest pain, unspecified: Secondary | ICD-10-CM | POA: Diagnosis not present

## 2021-08-17 DIAGNOSIS — M545 Low back pain, unspecified: Secondary | ICD-10-CM | POA: Diagnosis not present

## 2021-08-17 DIAGNOSIS — M47817 Spondylosis without myelopathy or radiculopathy, lumbosacral region: Secondary | ICD-10-CM | POA: Diagnosis not present

## 2021-08-20 DIAGNOSIS — M5416 Radiculopathy, lumbar region: Secondary | ICD-10-CM | POA: Diagnosis not present

## 2021-08-21 ENCOUNTER — Telehealth: Payer: Self-pay | Admitting: Physician Assistant

## 2021-08-21 NOTE — Telephone Encounter (Signed)
I tried to call, will call back

## 2021-08-21 NOTE — Telephone Encounter (Signed)
Kristine Stafford Is calling to ask christy some questions. Kristine Stafford has questions about power of attorney and medicine increase.

## 2021-08-21 NOTE — Telephone Encounter (Signed)
Husband called patient is still having headaches, on Mentantine '5mg'$  1 tab po qd, Aricept '10mg'$  1 tab poqd. Please advise, stay at one of the '5mg'$  daily or increase? Any suggestions

## 2021-08-22 NOTE — Telephone Encounter (Signed)
I spoke with [name], thanked me for calling. Will call with update next Wednesday.

## 2021-08-24 DIAGNOSIS — I5189 Other ill-defined heart diseases: Secondary | ICD-10-CM | POA: Diagnosis not present

## 2021-08-24 DIAGNOSIS — R079 Chest pain, unspecified: Secondary | ICD-10-CM | POA: Diagnosis not present

## 2021-08-28 DIAGNOSIS — Z1231 Encounter for screening mammogram for malignant neoplasm of breast: Secondary | ICD-10-CM | POA: Diagnosis not present

## 2021-08-29 ENCOUNTER — Other Ambulatory Visit: Payer: Self-pay | Admitting: Orthopedic Surgery

## 2021-08-29 ENCOUNTER — Telehealth: Payer: Self-pay | Admitting: Physician Assistant

## 2021-08-29 NOTE — Telephone Encounter (Signed)
Note, they send they regards for you. Also, patient is having headaches on going, headache free today, please advise on medication to see if it still needs to be held.

## 2021-08-29 NOTE — Telephone Encounter (Signed)
Left message of the above to call if any questions.

## 2021-08-29 NOTE — Telephone Encounter (Signed)
No answer at 1:37pm 08/29/2021

## 2021-08-29 NOTE — Telephone Encounter (Signed)
He would like a call back from christy regarding marys medicine. Him and christy have been talking about this

## 2021-09-03 DIAGNOSIS — R079 Chest pain, unspecified: Secondary | ICD-10-CM | POA: Diagnosis not present

## 2021-09-04 DIAGNOSIS — R079 Chest pain, unspecified: Secondary | ICD-10-CM | POA: Diagnosis not present

## 2021-09-05 NOTE — Progress Notes (Signed)
Surgical Instructions    Your procedure is scheduled on Wednesday, August 2nd, 2023.   Report to [facility] Main Entrance "A" at 10:00 A.M., then check in with the Admitting office.  Call this number if you have problems the morning of surgery:  [phone]   If you have any questions prior to your surgery date call [phone]: Open Monday-Friday 8am-4pm    Remember:  Do not eat after midnight the night before your surgery  You may drink clear liquids until 09:00 the morning of your surgery.   Clear liquids allowed are: Water, Non-Citrus Juices (without pulp), Carbonated Beverages, Clear Tea, Black Coffee ONLY (NO MILK, CREAM OR POWDERED CREAMER of any kind), and Gatorade  Patient Instructions  The night before surgery:  No food after midnight. ONLY clear liquids after midnight  The day of surgery (if you do NOT have diabetes):  Drink ONE (1) Pre-Surgery Clear Ensure by 09:00 the morning of surgery. Drink in one sitting. Do not sip.  This drink was given to you during your hospital  pre-op appointment visit.  Nothing else to drink after completing the  Pre-Surgery Clear Ensure.          If you have questions, please contact your surgeon's office.     Take these medicines the morning of surgery with A SIP OF WATER:   atorvastatin (LIPITOR)  memantine (NAMENDA)  If needed:  acetaminophen (TYLENOL) gabapentin (NEURONTIN) traMADol (ULTRAM)   As of today, STOP taking any Aspirin (unless otherwise instructed by your surgeon) Aleve, Naproxen, Ibuprofen, Motrin, Advil, Goody's, BC's, all herbal medications, fish oil, and all vitamins.    The day of surgery:          Do not wear jewelry or makeup Do not wear lotions, powders, perfumes, or deodorant. Do not shave 48 hours prior to surgery.   Do not bring valuables to the hospital. Do not wear nail polish, gel polish, artificial nails, or any other type of covering on natural nails (fingers and toes) If you have  artificial nails or gel coating that need to be removed by a nail salon, please have this removed prior to surgery. Artificial nails or gel coating may interfere with anesthesia's ability to adequately monitor your vital signs.  [place] is not responsible for any belongings or valuables. .   Do NOT Smoke (Tobacco/Vaping)  24 hours prior to your procedure  If you use a CPAP at night, you may bring your mask for your overnight stay.   Contacts, glasses, hearing aids, dentures or partials may not be worn into surgery, please bring cases for these belongings   For patients admitted to the hospital, discharge time will be determined by your treatment team.   Patients discharged the day of surgery will not be allowed to drive home, and someone needs to stay with them for 24 hours.   SURGICAL WAITING ROOM VISITATION Patients having surgery or a procedure may have no more than 2 support people in the waiting area - these visitors may rotate.   Children under the age of 69 must have an adult with them who is not the patient. If the patient needs to stay at the hospital during part of their recovery, the visitor guidelines for inpatient rooms apply. Pre-op nurse will coordinate an appropriate time for 1 support person to accompany patient in pre-op.  This support person may not rotate.   Please refer to the [facility] website for the visitor guidelines for Inpatients (after your surgery  is over and you are in a regular room).    Special instructions:    Oral Hygiene is also important to reduce your risk of infection.  Remember - BRUSH YOUR TEETH THE MORNING OF SURGERY WITH YOUR REGULAR TOOTHPASTE   [place]- Preparing For Surgery  Before surgery, you can play an important role. Because skin is not sterile, your skin needs to be as free of germs as possible. You can reduce the number of germs on your skin by washing with CHG (chlorahexidine gluconate) Soap before surgery.  CHG is an  antiseptic cleaner which kills germs and bonds with the skin to continue killing germs even after washing.     Please do not use if you have an allergy to CHG or antibacterial soaps. If your skin becomes reddened/irritated stop using the CHG.  Do not shave (including legs and underarms) for at least 48 hours prior to first CHG shower. It is OK to shave your face.  Please follow these instructions carefully.     Shower the NIGHT BEFORE SURGERY and the MORNING OF SURGERY with CHG Soap.   If you chose to wash your hair, wash your hair first as usual with your normal shampoo. After you shampoo, rinse your hair and body thoroughly to remove the shampoo.  Then [employer] and genitals (private parts) with your normal soap and rinse thoroughly to remove soap.  After that Use CHG Soap as you would any other liquid soap. You can apply CHG directly to the skin and wash gently with a scrungie or a clean washcloth.   Apply the CHG Soap to your body ONLY FROM THE NECK DOWN.  Do not use on open wounds or open sores. Avoid contact with your eyes, ears, mouth and genitals (private parts). Wash Face and genitals (private parts)  with your normal soap.   Wash thoroughly, paying special attention to the area where your surgery will be performed.  Thoroughly rinse your body with warm water from the neck down.  DO NOT shower/wash with your normal soap after using and rinsing off the CHG Soap.  Pat yourself dry with a CLEAN TOWEL.  Wear CLEAN PAJAMAS to bed the night before surgery  Place CLEAN SHEETS on your bed the night before your surgery  DO NOT SLEEP WITH PETS.   Day of Surgery:  Take a shower with CHG soap. Wear Clean/Comfortable clothing the morning of surgery Do not apply any deodorants/lotions.   Remember to brush your teeth WITH YOUR REGULAR TOOTHPASTE.    If you received a COVID test during your pre-op visit, it is requested that you wear a mask when out in public, stay away from anyone  that may not be feeling well, and notify your surgeon if you develop symptoms. If you have been in contact with anyone that has tested positive in the last 10 days, please notify your surgeon.    Please read over the following fact sheets that you were given.

## 2021-09-06 ENCOUNTER — Other Ambulatory Visit: Payer: Self-pay

## 2021-09-06 ENCOUNTER — Encounter (HOSPITAL_COMMUNITY): Payer: Self-pay

## 2021-09-06 ENCOUNTER — Encounter (HOSPITAL_COMMUNITY)
Admission: RE | Admit: 2021-09-06 | Discharge: 2021-09-06 | Disposition: A | Discharge status: Home / Self Care | Payer: Medicare PPO | Source: Ambulatory Visit | Attending: Orthopedic Surgery | Admitting: Orthopedic Surgery

## 2021-09-06 VITALS — BP 116/69 | HR 71 | Temp 98.3°F | Resp 17 | Ht 66.0 in | Wt 120.8 lb

## 2021-09-06 DIAGNOSIS — G309 Alzheimer's disease, unspecified: Secondary | ICD-10-CM | POA: Insufficient documentation

## 2021-09-06 DIAGNOSIS — Z01812 Encounter for preprocedural laboratory examination: Secondary | ICD-10-CM | POA: Diagnosis not present

## 2021-09-06 DIAGNOSIS — F0280 Dementia in other diseases classified elsewhere without behavioral disturbance: Secondary | ICD-10-CM | POA: Diagnosis not present

## 2021-09-06 DIAGNOSIS — Z01818 Encounter for other preprocedural examination: Secondary | ICD-10-CM

## 2021-09-06 LAB — SURGICAL PCR SCREEN
MRSA, PCR: NEGATIVE
Staphylococcus aureus: POSITIVE — AB

## 2021-09-06 LAB — BASIC METABOLIC PANEL
Anion gap: 8 (ref 5–15)
BUN: 15 mg/dL (ref 8–23)
CO2: 28 mmol/L (ref 22–32)
Calcium: 9.3 mg/dL (ref 8.9–10.3)
Chloride: 101 mmol/L (ref 98–111)
Creatinine, Ser: 0.94 mg/dL (ref 0.44–1.00)
GFR, Estimated: >60 mL/min (ref >60)
Glucose, Bld: 87 mg/dL (ref 70–99)
Potassium: 4.5 mmol/L (ref 3.5–5.1)
Sodium: 137 mmol/L (ref 135–145)

## 2021-09-06 NOTE — Progress Notes (Signed)
[name], significant other, needs to accompany pt to pre-op. Pt has advanced stage alzheimers

## 2021-09-06 NOTE — Progress Notes (Signed)
PCP - Dr. Donald Prose Cardiologist - Dr. Abran Richard- [facility] [place]  PPM/ICD - denies   Chest x-ray - 08/16/21 EKG - 09/03/21 Stress Test - 08/24/21 ECHO - denies Cardiac Cath - denies  Sleep Study - denies   DM- denies  ASA/Blood Thinner Instructions: n/a   ERAS Protcol - yes PRE-SURGERY Ensure given at PAT  COVID TEST- n/a   Anesthesia review: yes, cardiac hx  Patient denies shortness of breath, fever, cough and chest pain at PAT appointment   All instructions explained to the patient, with a verbal understanding of the material. Patient agrees to go over the instructions while at home for a better understanding. Patient also instructed to self quarantine after being tested for COVID-19. The opportunity to ask questions was provided.

## 2021-09-07 NOTE — Anesthesia Preprocedure Evaluation (Signed)
Anesthesia Evaluation Anesthesia Physical Anesthesia Plan  ASA:   Anesthesia Plan:    Post-op Pain Management:    Induction:   PONV Risk Score and Plan:   Airway Management Planned:   Additional Equipment:   Intra-op Plan:   Post-operative Plan:   Informed Consent:   Plan Discussed with:   Anesthesia Plan Comments: (PAT note by Karoline Caldwell, PA-C: Patient recently evaluated by cardiologist Dr. Otho Perl at [facility] for complaint of chest pain as well as for preop assessment.  Per note 09/03/2021, "[name] seems to be doing fine. Her myocardial perfusion scan showed no evidence of any significant ischemia but suggested possibly mildly reduced left-ventricular systolic function. At this point, I would not make any changes in her medical regimen. I think she should be acceptable risk to proceed on with her back surgery. After the back surgery and sometime later in the fall I would like her to have an echocardiogram to better assess her left ventricular systolic function. I will see her back in 6 months."  Pt has alzheimer's, needs significant other present to answer questions.   CMP 08/16/21 in care everywhere reviewed, sodium mildly low 132, otherwise unremarkable.   CBC 09/06/21 reviewed, WNL.  EKG 09/03/21 (care everywhere): Sinus rhythm. Rate 70.  Nuclear stress 08/24/2021 (care everywhere): IMPRESSION:  1. No scintigraphic evidence of prior infarction or  pharmacologically induced ischemia.  2. Mild global hypokinesia without geographic wall motion  abnormality.  3. Left ventricular ejection fraction-46%.  )        Anesthesia Quick Evaluation

## 2021-09-07 NOTE — Progress Notes (Signed)
Anesthesia Chart Review:  Patient recently evaluated by cardiologist Dr. Otho Perl at [facility] for complaint of chest pain as well as for preop assessment.  Per note 09/03/2021, "[name] seems to be doing fine. Her myocardial perfusion scan showed no evidence of any significant ischemia but suggested possibly mildly reduced left-ventricular systolic function. At this point, I would not make any changes in her medical regimen. I think she should be acceptable risk to proceed on with her back surgery. After the back surgery and sometime later in the fall I would like her to have an echocardiogram to better assess her left ventricular systolic function. I will see her back in 6 months."  Pt has alzheimer's, needs significant other present to answer questions.   CMP 08/16/21 in care everywhere reviewed, sodium mildly low 132, otherwise unremarkable.   CBC 09/06/21 reviewed, WNL.  EKG 09/03/21 (care everywhere): Sinus rhythm. Rate 70.  Nuclear stress 08/24/2021 (care everywhere): IMPRESSION:  1. No scintigraphic evidence of prior infarction or  pharmacologically induced ischemia.  2. Mild global hypokinesia without geographic wall motion  abnormality.  3. Left ventricular ejection fraction-46%.     Kristine Stafford [facility] Short Stay Center/Anesthesiology Phone [phone] 09/07/2021 1:20 PM

## 2021-09-10 DIAGNOSIS — M4697 Unspecified inflammatory spondylopathy, lumbosacral region: Secondary | ICD-10-CM | POA: Diagnosis not present

## 2021-09-10 DIAGNOSIS — M5416 Radiculopathy, lumbar region: Secondary | ICD-10-CM | POA: Diagnosis not present

## 2021-09-12 ENCOUNTER — Ambulatory Visit (HOSPITAL_COMMUNITY): Payer: Medicare PPO | Admitting: Physician Assistant

## 2021-09-12 ENCOUNTER — Ambulatory Visit (HOSPITAL_COMMUNITY): Payer: Medicare PPO

## 2021-09-12 ENCOUNTER — Other Ambulatory Visit: Payer: Self-pay

## 2021-09-12 ENCOUNTER — Ambulatory Visit (HOSPITAL_COMMUNITY)
Admission: RE | Disposition: A | Discharge status: Home / Self Care | Payer: Self-pay | Source: Ambulatory Visit | Attending: Orthopedic Surgery

## 2021-09-12 ENCOUNTER — Encounter (HOSPITAL_COMMUNITY): Payer: Self-pay | Admitting: Orthopedic Surgery

## 2021-09-12 ENCOUNTER — Ambulatory Visit (HOSPITAL_BASED_OUTPATIENT_CLINIC_OR_DEPARTMENT_OTHER): Payer: Medicare PPO | Admitting: Vascular Surgery

## 2021-09-12 ENCOUNTER — Ambulatory Visit (HOSPITAL_COMMUNITY)
Admission: RE | Admit: 2021-09-12 | Discharge: 2021-09-12 | Disposition: A | Discharge status: Home / Self Care | Payer: Medicare PPO | Source: Ambulatory Visit | Attending: Orthopedic Surgery | Admitting: Orthopedic Surgery

## 2021-09-12 DIAGNOSIS — M5416 Radiculopathy, lumbar region: Secondary | ICD-10-CM

## 2021-09-12 DIAGNOSIS — Z79899 Other long term (current) drug therapy: Secondary | ICD-10-CM | POA: Insufficient documentation

## 2021-09-12 DIAGNOSIS — M79662 Pain in left lower leg: Secondary | ICD-10-CM | POA: Diagnosis not present

## 2021-09-12 DIAGNOSIS — K219 Gastro-esophageal reflux disease without esophagitis: Secondary | ICD-10-CM | POA: Diagnosis not present

## 2021-09-12 DIAGNOSIS — I493 Ventricular premature depolarization: Secondary | ICD-10-CM | POA: Insufficient documentation

## 2021-09-12 DIAGNOSIS — Z981 Arthrodesis status: Secondary | ICD-10-CM | POA: Diagnosis not present

## 2021-09-12 DIAGNOSIS — F0390 Unspecified dementia without behavioral disturbance: Secondary | ICD-10-CM | POA: Insufficient documentation

## 2021-09-12 DIAGNOSIS — M4807 Spinal stenosis, lumbosacral region: Secondary | ICD-10-CM | POA: Diagnosis not present

## 2021-09-12 DIAGNOSIS — M79605 Pain in left leg: Secondary | ICD-10-CM | POA: Diagnosis not present

## 2021-09-12 DIAGNOSIS — Q7649 Other congenital malformations of spine, not associated with scoliosis: Secondary | ICD-10-CM | POA: Diagnosis not present

## 2021-09-12 HISTORY — PX: LUMBAR LAMINECTOMY/DECOMPRESSION MICRODISCECTOMY: SHX5026

## 2021-09-12 SURGERY — LUMBAR LAMINECTOMY/DECOMPRESSION MICRODISCECTOMY
Anesthesia: General

## 2021-09-12 MED ORDER — HYDROCODONE-ACETAMINOPHEN 5-325 MG PO TABS: 1.0000 | ORAL_TABLET | Freq: Four times a day (QID) | ORAL | 0 refills | Status: AC | PRN

## 2021-09-12 MED ORDER — LIDOCAINE 2% (20 MG/ML) 5 ML SYRINGE
INTRAMUSCULAR | Status: DC | PRN
  Administered 2021-09-12: 60 mg via INTRAVENOUS

## 2021-09-12 MED ORDER — 0.9 % SODIUM CHLORIDE (POUR BTL) OPTIME
TOPICAL | Status: DC | PRN
  Administered 2021-09-12: 1000 mL

## 2021-09-12 MED ORDER — BUPIVACAINE LIPOSOME 1.3 % IJ SUSP
INTRAMUSCULAR | Status: DC | PRN
  Administered 2021-09-12: 17 mL
  Administered 2021-09-12: 3 mL

## 2021-09-12 MED ORDER — VANCOMYCIN HCL IN DEXTROSE 1-5 GM/200ML-% IV SOLN
INTRAVENOUS | Status: AC
  Administered 2021-09-12: 1000 mg via INTRAVENOUS
  Filled 2021-09-12: qty 200

## 2021-09-12 MED ORDER — FENTANYL CITRATE (PF) 250 MCG/5ML IJ SOLN
INTRAMUSCULAR | Status: AC
  Filled 2021-09-12: qty 5

## 2021-09-12 MED ORDER — PROPOFOL 500 MG/50ML IV EMUL
INTRAVENOUS | Status: DC | PRN
  Administered 2021-09-12: 75 ug/kg/min via INTRAVENOUS

## 2021-09-12 MED ORDER — BUPIVACAINE LIPOSOME 1.3 % IJ SUSP
INTRAMUSCULAR | Status: AC
  Filled 2021-09-12: qty 20

## 2021-09-12 MED ORDER — PHENYLEPHRINE HCL-NACL 20-0.9 MG/250ML-% IV SOLN
INTRAVENOUS | Status: DC | PRN
  Administered 2021-09-12: 30 ug/min via INTRAVENOUS

## 2021-09-12 MED ORDER — METHYLPREDNISOLONE ACETATE 40 MG/ML IJ SUSP
INTRAMUSCULAR | Status: AC
  Filled 2021-09-12: qty 1

## 2021-09-12 MED ORDER — LACTATED RINGERS IV SOLN: INTRAVENOUS | Status: DC

## 2021-09-12 MED ORDER — SURGIFLO WITH THROMBIN (HEMOSTATIC MATRIX KIT) OPTIME
TOPICAL | Status: DC | PRN
  Administered 2021-09-12: 1 via TOPICAL

## 2021-09-12 MED ORDER — PROPOFOL 1000 MG/100ML IV EMUL
INTRAVENOUS | Status: AC
  Filled 2021-09-12: qty 100

## 2021-09-12 MED ORDER — ORAL CARE MOUTH RINSE: 15.0000 mL | Freq: Once | OROMUCOSAL | Status: AC

## 2021-09-12 MED ORDER — ACETAMINOPHEN 10 MG/ML IV SOLN
INTRAVENOUS | Status: AC
  Filled 2021-09-12: qty 100

## 2021-09-12 MED ORDER — BUPIVACAINE HCL (PF) 0.25 % IJ SOLN
INTRAMUSCULAR | Status: AC
  Filled 2021-09-12: qty 30

## 2021-09-12 MED ORDER — PROPOFOL 10 MG/ML IV BOLUS
INTRAVENOUS | Status: AC
  Filled 2021-09-12: qty 20

## 2021-09-12 MED ORDER — THROMBIN (RECOMBINANT) 20000 UNITS EX SOLR
CUTANEOUS | Status: AC
  Filled 2021-09-12: qty 20000

## 2021-09-12 MED ORDER — LIDOCAINE 2% (20 MG/ML) 5 ML SYRINGE
INTRAMUSCULAR | Status: AC
  Filled 2021-09-12: qty 5

## 2021-09-12 MED ORDER — VANCOMYCIN HCL IN DEXTROSE 1-5 GM/200ML-% IV SOLN: 1000.0000 mg | INTRAVENOUS | Status: AC

## 2021-09-12 MED ORDER — CHLORHEXIDINE GLUCONATE 0.12 % MT SOLN
OROMUCOSAL | Status: AC
  Administered 2021-09-12: 15 mL via OROMUCOSAL
  Filled 2021-09-12: qty 15

## 2021-09-12 MED ORDER — ACETAMINOPHEN 10 MG/ML IV SOLN
INTRAVENOUS | Status: DC | PRN
  Administered 2021-09-12: 1000 mg via INTRAVENOUS

## 2021-09-12 MED ORDER — PHENYLEPHRINE 80 MCG/ML (10ML) SYRINGE FOR IV PUSH (FOR BLOOD PRESSURE SUPPORT)
PREFILLED_SYRINGE | INTRAVENOUS | Status: AC
  Filled 2021-09-12: qty 10

## 2021-09-12 MED ORDER — ONDANSETRON HCL 4 MG/2ML IJ SOLN
INTRAMUSCULAR | Status: DC | PRN
  Administered 2021-09-12: 4 mg via INTRAVENOUS

## 2021-09-12 MED ORDER — BUPIVACAINE-EPINEPHRINE (PF) 0.25% -1:200000 IJ SOLN
INTRAMUSCULAR | Status: AC
  Filled 2021-09-12: qty 30

## 2021-09-12 MED ORDER — SUGAMMADEX SODIUM 200 MG/2ML IV SOLN
INTRAVENOUS | Status: DC | PRN
  Administered 2021-09-12: 100 mg via INTRAVENOUS

## 2021-09-12 MED ORDER — ROCURONIUM BROMIDE 10 MG/ML (PF) SYRINGE
PREFILLED_SYRINGE | INTRAVENOUS | Status: DC | PRN
  Administered 2021-09-12: 60 mg via INTRAVENOUS

## 2021-09-12 MED ORDER — ROCURONIUM BROMIDE 10 MG/ML (PF) SYRINGE
PREFILLED_SYRINGE | INTRAVENOUS | Status: AC
  Filled 2021-09-12: qty 10

## 2021-09-12 MED ORDER — SUCCINYLCHOLINE CHLORIDE 200 MG/10ML IV SOSY
PREFILLED_SYRINGE | INTRAVENOUS | Status: AC
  Filled 2021-09-12: qty 10

## 2021-09-12 MED ORDER — FENTANYL CITRATE (PF) 250 MCG/5ML IJ SOLN
INTRAMUSCULAR | Status: DC | PRN
  Administered 2021-09-12: 50 ug via INTRAVENOUS

## 2021-09-12 MED ORDER — METHYLPREDNISOLONE ACETATE 40 MG/ML IJ SUSP
INTRAMUSCULAR | Status: DC | PRN
  Administered 2021-09-12: 40 mg

## 2021-09-12 MED ORDER — PROPOFOL 10 MG/ML IV BOLUS
INTRAVENOUS | Status: DC | PRN
  Administered 2021-09-12: 80 mg via INTRAVENOUS

## 2021-09-12 MED ORDER — PHENYLEPHRINE 80 MCG/ML (10ML) SYRINGE FOR IV PUSH (FOR BLOOD PRESSURE SUPPORT)
PREFILLED_SYRINGE | INTRAVENOUS | Status: DC | PRN
  Administered 2021-09-12: 160 ug via INTRAVENOUS

## 2021-09-12 MED ORDER — THROMBIN 20000 UNITS EX SOLR
CUTANEOUS | Status: DC | PRN
  Administered 2021-09-12: 20 mL via TOPICAL

## 2021-09-12 MED ORDER — BUPIVACAINE-EPINEPHRINE 0.25% -1:200000 IJ SOLN
INTRAMUSCULAR | Status: DC | PRN
  Administered 2021-09-12: 17 mL
  Administered 2021-09-12: 3 mL

## 2021-09-12 MED ORDER — METHOCARBAMOL 750 MG PO TABS: 750.0000 mg | ORAL_TABLET | Freq: Four times a day (QID) | ORAL | 0 refills | Status: DC | PRN

## 2021-09-12 MED ORDER — CHLORHEXIDINE GLUCONATE 0.12 % MT SOLN: 15.0000 mL | Freq: Once | OROMUCOSAL | Status: AC

## 2021-09-12 SURGICAL SUPPLY — 69 items
AGENT HMST KT MTR STRL THRMB (HEMOSTASIS) ×1
APL SKNCLS STERI-STRIP NONHPOA (GAUZE/BANDAGES/DRESSINGS) ×1
BAG COUNTER SPONGE SURGICOUNT (BAG) ×3 IMPLANT
BAG SPNG CNTER NS LX DISP (BAG) ×1
BENZOIN TINCTURE PRP APPL 2/3 (GAUZE/BANDAGES/DRESSINGS) ×1 IMPLANT
BNDG GAUZE ELAST 4 BULKY (GAUZE/BANDAGES/DRESSINGS) ×3 IMPLANT
BUR ROUND PRECISION 4.0 (BURR) ×3 IMPLANT
CABLE BIPOLOR RESECTION CORD (MISCELLANEOUS) ×3 IMPLANT
CANISTER SUCT 3000ML PPV (MISCELLANEOUS) ×3 IMPLANT
CARTRIDGE OIL MAESTRO DRILL (MISCELLANEOUS) ×2 IMPLANT
CLSR STERI-STRIP ANTIMIC 1/2X4 (GAUZE/BANDAGES/DRESSINGS) ×1 IMPLANT
COVER SURGICAL LIGHT HANDLE (MISCELLANEOUS) ×3 IMPLANT
DIFFUSER DRILL AIR PNEUMATIC (MISCELLANEOUS) ×3 IMPLANT
DRAPE POUCH INSTRU U-SHP 10X18 (DRAPES) ×6 IMPLANT
DRAPE SURG 17X23 STRL (DRAPES) ×12 IMPLANT
DURAPREP 26ML APPLICATOR (WOUND CARE) ×3 IMPLANT
ELECT BLADE 4.0 EZ CLEAN MEGAD (MISCELLANEOUS) ×2
ELECT CAUTERY BLADE 6.4 (BLADE) ×3 IMPLANT
ELECT REM PT RETURN 9FT ADLT (ELECTROSURGICAL) ×2
ELECTRODE BLDE 4.0 EZ CLN MEGD (MISCELLANEOUS) ×2 IMPLANT
ELECTRODE REM PT RTRN 9FT ADLT (ELECTROSURGICAL) ×2 IMPLANT
FILTER STRAW FLUID ASPIR (MISCELLANEOUS) ×3 IMPLANT
GAUZE 4X4 16PLY ~~LOC~~+RFID DBL (SPONGE) ×6 IMPLANT
GAUZE SPONGE 4X4 12PLY STRL (GAUZE/BANDAGES/DRESSINGS) ×3 IMPLANT
GLOVE BIO SURGEON STRL SZ7 (GLOVE) ×3 IMPLANT
GLOVE BIO SURGEON STRL SZ8 (GLOVE) ×3 IMPLANT
GLOVE BIOGEL PI IND STRL 7.0 (GLOVE) ×2 IMPLANT
GLOVE BIOGEL PI IND STRL 8 (GLOVE) ×2 IMPLANT
GLOVE BIOGEL PI INDICATOR 7.0 (GLOVE) ×1
GLOVE BIOGEL PI INDICATOR 8 (GLOVE) ×1
GLOVE SURG ENC MOIS LTX SZ6.5 (GLOVE) ×3 IMPLANT
GOWN STRL REUS W/ TWL LRG LVL3 (GOWN DISPOSABLE) ×2 IMPLANT
GOWN STRL REUS W/ TWL XL LVL3 (GOWN DISPOSABLE) ×4 IMPLANT
GOWN STRL REUS W/TWL LRG LVL3 (GOWN DISPOSABLE) ×2
GOWN STRL REUS W/TWL XL LVL3 (GOWN DISPOSABLE) ×4
IV CATH 14GX2 1/4 (CATHETERS) ×3 IMPLANT
KIT BASIN OR (CUSTOM PROCEDURE TRAY) ×3 IMPLANT
KIT POSITION SURG JACKSON T1 (MISCELLANEOUS) ×3 IMPLANT
KIT TURNOVER KIT B (KITS) ×3 IMPLANT
MARKER SKIN DUAL TIP RULER LAB (MISCELLANEOUS) ×3 IMPLANT
NDL 18GX1X1/2 (RX/OR ONLY) (NEEDLE) ×2 IMPLANT
NDL HYPO 25GX1X1/2 BEV (NEEDLE) ×2 IMPLANT
NDL SPNL 18GX3.5 QUINCKE PK (NEEDLE) ×4 IMPLANT
NEEDLE 18GX1X1/2 (RX/OR ONLY) (NEEDLE) ×2 IMPLANT
NEEDLE 22X1 1/2 (OR ONLY) (NEEDLE) ×3 IMPLANT
NEEDLE HYPO 25GX1X1/2 BEV (NEEDLE) ×2 IMPLANT
NEEDLE SPNL 18GX3.5 QUINCKE PK (NEEDLE) ×4 IMPLANT
NS IRRIG 1000ML POUR BTL (IV SOLUTION) ×3 IMPLANT
OIL CARTRIDGE MAESTRO DRILL (MISCELLANEOUS) ×2
PACK LAMINECTOMY ORTHO (CUSTOM PROCEDURE TRAY) ×3 IMPLANT
PACK UNIVERSAL I (CUSTOM PROCEDURE TRAY) ×3 IMPLANT
PAD ARMBOARD 7.5X6 YLW CONV (MISCELLANEOUS) ×6 IMPLANT
PATTIES SURGICAL .5 X1 (DISPOSABLE) ×3 IMPLANT
SPONGE INTESTINAL PEANUT (DISPOSABLE) ×3 IMPLANT
SPONGE SURGIFOAM ABS GEL SZ50 (HEMOSTASIS) ×3 IMPLANT
SURGIFLO W/THROMBIN 8M KIT (HEMOSTASIS) ×1 IMPLANT
SUT MNCRL AB 4-0 PS2 18 (SUTURE) ×3 IMPLANT
SUT VIC AB 1 CT1 18XCR BRD 8 (SUTURE) ×2 IMPLANT
SUT VIC AB 1 CT1 8-18 (SUTURE) ×2
SUT VIC AB 2-0 CT2 18 VCP726D (SUTURE) ×3 IMPLANT
SYR 20ML LL LF (SYRINGE) ×3 IMPLANT
SYR BULB IRRIG 60ML STRL (SYRINGE) ×3 IMPLANT
SYR CONTROL 10ML LL (SYRINGE) ×6 IMPLANT
SYR TB 1ML LUER SLIP (SYRINGE) ×12 IMPLANT
TAPE CLOTH SURG 4X10 WHT LF (GAUZE/BANDAGES/DRESSINGS) ×1 IMPLANT
TOWEL GREEN STERILE (TOWEL DISPOSABLE) ×3 IMPLANT
TOWEL GREEN STERILE FF (TOWEL DISPOSABLE) ×3 IMPLANT
WATER STERILE IRR 1000ML POUR (IV SOLUTION) ×3 IMPLANT
YANKAUER SUCT BULB TIP NO VENT (SUCTIONS) ×3 IMPLANT

## 2021-09-12 NOTE — Anesthesia Postprocedure Evaluation (Signed)
Anesthesia Post Note  Patient: Kristine Stafford  Procedure(s) Performed: LUMBAR 5- SACRUM 1 DECOMPRESSION     Patient location during evaluation: PACU Anesthesia Type: General Level of consciousness: awake and alert Pain management: pain level controlled Vital Signs Assessment: post-procedure vital signs reviewed and stable Respiratory status: spontaneous breathing, nonlabored ventilation and respiratory function stable Cardiovascular status: blood pressure returned to baseline and stable Postop Assessment: no apparent nausea or vomiting Anesthetic complications: no   No notable events documented.  Last Vitals:  Vitals:   09/12/21 1507 09/12/21 1522  BP: 126/70 (!) 148/71  Pulse: 67 71  Resp: 18 18  Temp:  36.6 C  SpO2: 100% 100%    Last Pain:  Vitals:   09/12/21 1522  TempSrc:   PainSc: 0-No pain                 Mayar Whittier,W. EDMOND

## 2021-09-12 NOTE — Anesthesia Procedure Notes (Signed)
Procedure Name: Intubation Date/Time: 09/12/2021 1:22 PM  Performed by: Darletta Moll, CRNAPre-anesthesia Checklist: Patient identified, Emergency Drugs available, Suction available and Patient being monitored Patient Re-evaluated:Patient Re-evaluated prior to induction Oxygen Delivery Method: Circle system utilized Preoxygenation: Pre-oxygenation with 100% oxygen Induction Type: IV induction Ventilation: Mask ventilation without difficulty Laryngoscope Size: Mac and 3 Grade View: Grade I Tube type: Oral Tube size: 7.0 mm Number of attempts: 1 Airway Equipment and Method: Stylet and Oral airway Placement Confirmation: ETT inserted through vocal cords under direct vision, positive ETCO2 and breath sounds checked- equal and bilateral Secured at: 21 cm Tube secured with: Tape Dental Injury: Teeth and Oropharynx as per pre-operative assessment

## 2021-09-12 NOTE — H&P (Signed)
PREOPERATIVE H&P  Chief Complaint: Left leg pain  HPI: Kristine Stafford is a 71 y.o. female who presents with ongoing pain in the left leg  MRI reveals minimal to moderate stenosis on the left at L5/S1  Patient has failed multiple forms of conservative care and continues to have pain (see office notes for additional details regarding the patient's full course of treatment)  Past Medical History:  Diagnosis Date   Abnormal Pap smear    Chronic pain syndrome    GERD (gastroesophageal reflux disease)    Heart murmur    pt and pt's sig other denies   Herpes simplex without mention of complication    HSV 2    Left hip pain    Major neurocognitive disorder due to Alzheimer's disease 01/15/2021   Migraine    Osteopenia    in neck   Osteoporosis    Pure hypercholesterolemia    PVC (premature ventricular contraction)    Thyroid disease    Past Surgical History:  Procedure Laterality Date   APPENDECTOMY     FOOT SURGERY Left    WRIST FRACTURE SURGERY Left    metal in left wrist   Social History   Socioeconomic History   Marital status: Significant Other    Spouse name: Not on file   Number of children: 0   Years of education: 9   Highest education level: Master's degree (e.g., MA, MS, MEng, MEd, MSW, MBA)  Occupational History   Occupation: Retired    Comment: Copy  Tobacco Use   Smoking status: Never   Smokeless tobacco: Never  Vaping Use   Vaping Use: Never used  Substance and Sexual Activity   Alcohol use: Yes    Alcohol/week: 1.0 standard drink of alcohol    Types: 1 Glasses of wine per week    Comment: not every week   Drug use: No   Sexual activity: Yes    Birth control/protection: Post-menopausal  Other Topics Concern   Not on file  Social History Narrative   Right handed    Lives with family    Social Determinants of Health   Financial Resource Strain: Not on file  Food Insecurity: Not on file  Transportation Needs: Not on  file  Physical Activity: Not on file  Stress: Not on file  Social Connections: Not on file   Family History  Problem Relation Age of Onset   Emphysema Mother    Lung cancer Mother    Kidney failure Mother    Bipolar disorder Mother    Interstitial cystitis Mother    Prostate cancer Father    Alzheimer's disease Father    Liver disease Sister        hep c    Pleurisy Maternal Grandmother    Allergies  Allergen Reactions   Eggs Or Egg-Derived Products Other (See Comments)    Husband unaware of intolerance   Other     Oats,poppyseeds,yeast,baker and brewers-Husband unaware of intolerance   Penicillins Other (See Comments)    Unknown reaction   Tetracyclines & Related Other (See Comments)    Unknown reaction   Prior to Admission medications   Medication Sig Start Date End Date Taking? Authorizing Provider  acetaminophen (TYLENOL) 500 MG tablet Take 1,000 mg by mouth every 6 (six) hours as needed (for pain.).   Yes [provider]  alendronate (FOSAMAX) 70 MG tablet Take 70 mg by mouth every Tuesday. Take with a full glass of water on  an empty stomach.   Yes [provider]  Calcium Carbonate (CALCIUM 600 PO) Take 600 mg by mouth in the morning.   Yes [provider]  Cholecalciferol (VITAMIN D3) 125 MCG (5000 UT) TABS Take 5,000 Units by mouth in the morning.   Yes [provider]  donepezil (ARICEPT) 10 MG tablet Take 1 tablet at 10 mg daily Patient taking differently: Take 10 mg by mouth at bedtime. 07/23/21  Yes Rondel Jumbo, PA-C  gabapentin (NEURONTIN) 100 MG capsule Take 100-200 mg by mouth 2 (two) times daily as needed for pain. 08/01/21  Yes [provider]  ibuprofen (ADVIL) 200 MG tablet Take 400 mg by mouth every 8 (eight) hours as needed (pain.).   Yes [provider]  Menthol, Topical Analgesic, (BIOFREEZE EX) Apply 1 Application topically 3 (three) times daily as needed (back pain.).   Yes [provider]   Omega-3 Fatty Acids (FISH OIL) 1000 MG CAPS Take 1,000 mg by mouth every evening.   Yes [provider]  Polyethyl Glycol-Propyl Glycol (LUBRICANT EYE DROPS) 0.4-0.3 % SOLN Place 1-2 drops into both eyes 3 (three) times daily as needed (dry/irritated eyes.).   Yes [provider]  traMADol (ULTRAM) 50 MG tablet Take 50 mg by mouth 3 (three) times daily as needed (pain.). 12/28/20  Yes [provider]  TURMERIC CURCUMIN PO Take 1 capsule by mouth every evening.   Yes [provider]  vitamin B-12 (CYANOCOBALAMIN) 1000 MCG tablet Take 1,000 mcg by mouth in the morning.   Yes [provider]  atorvastatin (LIPITOR) 20 MG tablet Take 20 mg by mouth in the morning. 11/01/20   [provider]  meloxicam (MOBIC) 15 MG tablet Take 15 mg by mouth as needed for pain.    [provider]  memantine (NAMENDA) 5 MG tablet Take 1 tablet (5 mg at night) for 2 weeks, then increase to 1 tablet (5 mg) twice a day 07/23/21   Rondel Jumbo, PA-C     All other systems have been reviewed and were otherwise negative with the exception of those mentioned in the HPI and as above.  Physical Exam: There were no vitals filed for this visit.  There is no height or weight on file to calculate BMI.  General: Alert, no acute distress Cardiovascular: No pedal edema Respiratory: No cyanosis, no use of accessory musculature Skin: No lesions in the area of chief complaint Neurologic: Sensation intact distally Psychiatric: Patient is competent for consent with normal mood and affect Lymphatic: No axillary or cervical lymphadenopathy   Assessment/Plan: Likely left-sided lumbar radiculopathy secondary to facet hypertrophy on the left at L5-S1. Plan for Procedure(s): LUMBAR 5- SACRUM 1 DECOMPRESSION   Norva Karvonen, MD 09/12/2021 7:34 AM

## 2021-09-12 NOTE — Transfer of Care (Signed)
Immediate Anesthesia Transfer of Care Note  Patient: Kristine Stafford  Procedure(s) Performed: LUMBAR 5- SACRUM 1 DECOMPRESSION  Patient Location: PACU  Anesthesia Type:General  Level of Consciousness: drowsy and patient cooperative  Airway & Oxygen Therapy: Patient Spontanous Breathing and Patient connected to nasal cannula oxygen  Post-op Assessment: Report given to RN, Post -op Vital signs reviewed and stable and Patient moving all extremities X 4  Post vital signs: Reviewed and stable  Last Vitals:  Vitals Value Taken Time  BP 123/60 09/12/21 1452  Temp    Pulse 74 09/12/21 1453  Resp 15 09/12/21 1453  SpO2 100 % 09/12/21 1453  Vitals shown include unvalidated device data.  Last Pain:  Vitals:   09/12/21 1033  TempSrc:   PainSc: 5       Patients Stated Pain Goal: 2 (46/56/81 2751)  Complications: No notable events documented.

## 2021-09-12 NOTE — Op Note (Signed)
PATIENT NAME: Kristine Stafford   MEDICAL RECORD NO.:   [id number]   DATE OF BIRTH: [birth year]   DATE OF PROCEDURE: 09/12/2021                               OPERATIVE REPORT   PREOPERATIVE DIAGNOSES: 1. Left leg pain. 3. Spinal stenosis, L5/S1  POSTOPERATIVE DIAGNOSES: 1. Left leg pain. 3. Spinal stenosis, L5/S1  PROCEDURE:  L5/S1 laminectomy with bilateral partial facetectomy  SURGEON:  Phylliss Bob, MD.  ASSISTANTPricilla Holm, PA-C.  ANESTHESIA:  General endotracheal anesthesia.  COMPLICATIONS:  None.  DISPOSITION:  Stable.  ESTIMATED BLOOD LOSS:  Minimal.  INDICATIONS FOR SURGERY:  Briefly, Kristine Stafford is a very pleasant 71- year-old emale, who did present to me with pain in the her left leg. The patient's MRI did reveal minimal to moderate spinal stenosis at L5/S1. I did proceed with appropriate conservative treatment, but the patient did continue to have ongoing pain, which he did feel was limiting her function substantially.  I did discuss with the patient that I did question whether the facet hypertrophy was causing her symptoms.  I did recommend a second opinion, and she was evaluated by Kristine Stafford.  He did feel that a decompressive procedure would be appropriate.  I did see her back and let her know that I would be willing to proceed with surgery, although she did understand that there was a chance that surgery may not help her, as the compression was not severe.  The patient was fully aware of the risks and limitations of surgery and did wish to proceed.  OPERATIVE DETAILS:  On 09/12/2021, the patient was brought to surgery and general endotracheal anesthesia was administered.  The patient was placed prone on a well-padded flat Jackson bed with a Jackson spinal frame. Antibiotics were given and the back was prepped and draped in the usual sterile fashion.  A time-out procedure was performed.  I then made a midline incision overlying the L5-S1 intervertebral  space.  The fascia was incised to the left of the midline.  The left-sided paraspinal musculature was bluntly retracted laterally and held retracted with a self-retaining retractor. After confirming the appropriate operative level, I proceeded with a partial facetectomy on the right and left sides, removing ligamentum flavum hypertrophy bilaterally.  I did pay meticulous close attention to the facet hypertrophy on the left side.  I was very thorough and meticulous with the partial facetectomy on the left, and did remove the appropriate amount of the facet joint in order to thoroughly and entirely decompress the traversing left S1 nerve.  The foramen was also thoroughly decompressed.  The decompression was confirmed using a Barbra Sarks out the left L5-S1 neuroforamen, and passing the Woodson medial to the left S1 pedicle.  The exiting left L5 nerve and the traversing left S1 nerve was entirely decompressed. All bleeding was then adequately controlled.  At this point, 40 mg of Depo-Medrol was introduced about the epidural space.  Prior to this, the wound was copiously irrigated with a total of approximately 2 L of normal saline. Gelfoam was placed over the laminectomy site.  I was very pleased with the decompression.  There was no extravasation of cerebrospinal fluid noted throughout the entire surgery.  At this point, the wound was closed in layers using #1 Vicryl, followed by 2-0 Vicryl, followed by 4- 0 Monocryl.  Benzoin and Steri-Strips were applied, followed by a  sterile dressing.  All instrument counts were correct at the termination of the procedure.  Of note, Pricilla Holm, PA-C, was my assistant throughout surgery, and did aid in retraction, suctioning, and closure from start to finish.   Phylliss Bob, MD

## 2021-09-13 ENCOUNTER — Encounter (HOSPITAL_COMMUNITY): Payer: Self-pay | Admitting: Orthopedic Surgery

## 2021-09-13 MED FILL — Thrombin (Recombinant) For Soln 20000 Unit: CUTANEOUS | Qty: 1 | Status: AC

## 2021-10-01 ENCOUNTER — Telehealth: Payer: Self-pay | Admitting: Physician Assistant

## 2021-10-01 NOTE — Telephone Encounter (Signed)
[name] called and stated Carrisa  would like to know if its okay for her to start back on the medicine memantine. She was taking it and it was causing her headaches.

## 2021-10-01 NOTE — Telephone Encounter (Signed)
I advised to do one tablets daily,thank you

## 2021-10-29 DIAGNOSIS — M47896 Other spondylosis, lumbar region: Secondary | ICD-10-CM | POA: Diagnosis not present

## 2021-10-29 DIAGNOSIS — K219 Gastro-esophageal reflux disease without esophagitis: Secondary | ICD-10-CM | POA: Diagnosis not present

## 2021-10-29 DIAGNOSIS — Z4789 Encounter for other orthopedic aftercare: Secondary | ICD-10-CM | POA: Diagnosis not present

## 2021-12-10 DIAGNOSIS — L03317 Cellulitis of buttock: Secondary | ICD-10-CM | POA: Diagnosis not present

## 2021-12-12 DIAGNOSIS — L03317 Cellulitis of buttock: Secondary | ICD-10-CM | POA: Diagnosis not present

## 2022-01-07 DIAGNOSIS — G309 Alzheimer's disease, unspecified: Secondary | ICD-10-CM | POA: Diagnosis not present

## 2022-01-07 DIAGNOSIS — Z23 Encounter for immunization: Secondary | ICD-10-CM | POA: Diagnosis not present

## 2022-01-07 DIAGNOSIS — F0280 Dementia in other diseases classified elsewhere without behavioral disturbance: Secondary | ICD-10-CM | POA: Diagnosis not present

## 2022-01-07 DIAGNOSIS — M549 Dorsalgia, unspecified: Secondary | ICD-10-CM | POA: Diagnosis not present

## 2022-01-11 DIAGNOSIS — M533 Sacrococcygeal disorders, not elsewhere classified: Secondary | ICD-10-CM | POA: Diagnosis not present

## 2022-01-22 ENCOUNTER — Encounter: Payer: Self-pay | Admitting: Physician Assistant

## 2022-01-22 ENCOUNTER — Ambulatory Visit: Payer: Medicare PPO | Admitting: Physician Assistant

## 2022-01-22 VITALS — Resp 20 | Ht 62.0 in | Wt 125.0 lb

## 2022-01-22 DIAGNOSIS — G309 Alzheimer's disease, unspecified: Secondary | ICD-10-CM | POA: Diagnosis not present

## 2022-01-22 DIAGNOSIS — F0280 Dementia in other diseases classified elsewhere without behavioral disturbance: Secondary | ICD-10-CM | POA: Diagnosis not present

## 2022-01-22 NOTE — Addendum Note (Signed)
Addended by: Sharene Butters E on: 01/22/2022 06:26 PM   Modules accepted: Level of Service

## 2022-01-22 NOTE — Progress Notes (Addendum)
Assessment/Plan:   Dementia likely due to Alzheimer's Disease  Kristine Stafford is a very pleasant 70 y.o. RH female  with a history of migraines, hypothyroidism, hyperlipidemia, hypertension and a history of major neurodegenerative disorder likely due to Alzheimer's disease per neurocognitive exam October 2022, seen today in follow up for memory loss. Patient is currently on donepezil 10 mg daily and memantine 5 mg twice daily.  Given the current diagnosis and high test anxiety, it is felt prudent not to repeat the Neuropsych evaluation.   Follow up in  6 months. Continue donepezil 10 mg daily and memantine 5 mg bid, side effects discussed Continue to control mood as per PCP Recommend good control of cardiovascular risk factors.   Continue B12 supplements    Subjective:    This patient is accompanied in the office by her boyfriend Kristine Stafford who supplements the history.  Previous records as well as any outside records available were reviewed prior to todays visit.  She was last seen 07/23/2021 at which time his MMSE was 15/30 with noted cognitive decline at the time.   Any changes in memory since last visit?  "Her short-term memory may be a little worse ", Kristine Stafford reports.  She has difficulties remembering the names of simple objects, or when trying to explain an event, she has more trouble with fluency than before.  During the day she likes to do jigsaw puzzles. repeats oneself?  Endorsed "a little bit" Hoarding behavior?  Endorsed, especially old objects, art work Scientist, water quality when walking into a room?  Patient denies   Leaving objects in unusual places?  Endorsed by Kristine Stafford, when she puts away dishes, she does not place them were it is supposed to go Ambulates  with difficulty? She walks slowly due to pain, followed with Orthopedics, did PT, now to do OP.  She enjoys walking around her property, enjoying nature, but not often due to cold weather. Recent falls?  Patient denies   Any head injuries?   Patient denies   History of seizures?   Patient denies   Wandering behavior?  Patient denies   Patient drives?   Patient no longer drives  Any mood changes since last visit?  Occasionally he gets "snappy", but not very often  Any worsening depression?:" I feel down sometimes, when I am hurting I do". "If a pain free day I feel better". Hallucinations?  Patient denies   Paranoia?  Patient denies   Patient reports that sleeps well, may feel more tired than before, without vivid dreams, REM behavior or sleepwalking   History of sleep apnea?  Patient denies   Any hygiene concerns?  Patient denies   Independent of bathing and dressing?  Endorsed  Does the patient needs help with medications?  Kristine Stafford in charge  Who is in charge of the finances?   Kristine Stafford is in charge   Any changes in appetite?  "She is hungrier than before"-[name] says.   Patient have trouble swallowing?  She may take a little time to take her meds  Does the patient cook?  only with [name]'s supervision any kitchen accidents such as leaving the stove on? Patient denies   Any headaches?  Patient denies   Double vision? Patient denies   Any focal numbness or tingling?  Patient denies   Chronic back pain endorsed,due to lumbar radiculopathy followed by orthopedics. Unilateral weakness?  Patient denies   Any tremors?  Patient denies   Any history of anosmia?  Patient denies   Any  incontinence of urine?  Patient denies   Any bowel dysfunction?  "She has been somewhat constipated"-[name] says Patient lives with: Kristine Stafford      Initial Evaluation 09/05/20 The patient is seen in neurologic consultation at the request of Kristine Prose, MD for the evaluation of memory.  The patient is accompanied by boyfriend Kristine Stafford who supplements the history. She is a 71 y.o. year old female who has had memory issues for about  1 year where she has noticed worsening short term recall issues. Kristine Stafford reports that she is increasingly asking the same questions and repeating the  same stories. " She cannot retain new information". She lives with Kristine Stafford for the last 40 years. He noticed increased hoarding old objects and she adds "this is clogging my life". "She appears more stressed than depressed and Covid pandemic adds to it "-he says. Denies irritability. Her sleep pattern is "erratic", goes to sleep late and wakes up during the night, but she is trying to change her schedule to go to sleep at the same time. Denies acting out in dreams or sleepwalking. Denies hallucinations or paranoia. She is independent of dressing and bathing. Kristine Stafford manages her medications because she has a tendency to forget. He also manages the bill because she has missed some payments and he does the driving after she could not remember how to get from the pharmacy to the grocery store. A few months back, she made a trip to [place] and took the wrong turn, so she voluntarily surrendered the driving. She cooks occasionally, denies leaving the stove or faucet on. Appetite is good and denies trouble swallowing.  Denies leaving objects in unusual places. Ambulates without difficulty without a cane or walker.  Denies headaches or  falls. She had injury to the head at home, with a folding table about 1 y ago, without LOC. No visit to the ER or imaging was reported. Denies double vision, dizziness, focal numbness or tingling, unilateral weakness or tremors. Denies urine incontinence or retention. Denies constipation or diarrhea.  Denies anosmia. Denies history of OSA, ETOH  or Tobacco. Family History Dad dementia 90s AD. College education. Former Licensed conveyancer.    Brain MRI on 09/20/2020 revealed generalized cerebral volume loss of unspecified severity, as well as less than typical small vessel ischemic changes for her age.     Neurocognitive Exam 11/23/20 Briefly, results suggested diffuse, severe cognitive impairment with performances commonly scoring in the exceptionally low normative range. She did reasonably well on an  isolated line orientation task and exhibited some performance variability across processing speed and confrontation naming. However, in the case of the former two domains, performance was below expectation overall. Performance was consistently impaired across domains of basic attention, cognitive flexibility, safety/judgment, receptive language, verbal fluency, visuoconstructional abilities, and all aspects of learning and memory. Unfortunately, the most likely etiology at the present time is Alzheimer's disease. When initially learning novel information, Ms. Bankson did not benefit from repeated learning trials. After a brief delay, she was fully amnestic (i.e., 0% retention) across all three memory tasks. She further displayed poor performance across yes/no recognition trials. Taken together, this suggests a severe memory storage deficit, which is the hallmark characteristic of Alzheimer's disease.   TSH 09/05/20 3.44 and B12 228     PREVIOUS MEDICATIONS:   CURRENT MEDICATIONS:  Outpatient Encounter Medications as of 01/22/2022  Medication Sig   acetaminophen (TYLENOL) 500 MG tablet Take 1,000 mg by mouth every 6 (six) hours as needed (for pain.).   alendronate (  FOSAMAX) 70 MG tablet Take 70 mg by mouth every Tuesday. Take with a full glass of water on an empty stomach.   Calcium Carbonate (CALCIUM 600 PO) Take 600 mg by mouth in the morning.   Cholecalciferol (VITAMIN D3) 125 MCG (5000 UT) TABS Take 5,000 Units by mouth in the morning.   donepezil (ARICEPT) 10 MG tablet Take 1 tablet at 10 mg daily (Patient taking differently: Take 10 mg by mouth at bedtime.)   gabapentin (NEURONTIN) 100 MG capsule Take 100-200 mg by mouth 2 (two) times daily as needed for pain.   HYDROcodone-acetaminophen (NORCO/VICODIN) 5-325 MG tablet Take 1 tablet by mouth every 6 (six) hours as needed for moderate pain or severe pain.   Menthol, Topical Analgesic, (BIOFREEZE EX) Apply 1 Application topically 3 (three) times  daily as needed (back pain.).   methocarbamol (ROBAXIN) 750 MG tablet Take 1 tablet (750 mg total) by mouth every 6 (six) hours as needed for muscle spasms.   Polyethyl Glycol-Propyl Glycol (LUBRICANT EYE DROPS) 0.4-0.3 % SOLN Place 1-2 drops into both eyes 3 (three) times daily as needed (dry/irritated eyes.).   traMADol (ULTRAM) 50 MG tablet Take 50 mg by mouth 3 (three) times daily as needed (pain.).   vitamin B-12 (CYANOCOBALAMIN) 1000 MCG tablet Take 1,000 mcg by mouth in the morning.   atorvastatin (LIPITOR) 20 MG tablet Take 20 mg by mouth in the morning. (Patient not taking: Reported on 01/22/2022)   memantine (NAMENDA) 5 MG tablet Take 1 tablet (5 mg at night) for 2 weeks, then increase to 1 tablet (5 mg) twice a day (Patient not taking: Reported on 01/22/2022)   No facility-administered encounter medications on file as of 01/22/2022.       07/23/2021    8:00 PM  MMSE - Mini Mental State Exam  Orientation to time 1  Orientation to Place 3  Registration 2  Attention/ Calculation 2  Recall 0  Language- name 2 objects 1  Language- repeat 1  Language- follow 3 step command 3  Language- read & follow direction 1  Write a sentence 1  Copy design 0  Total score 15      09/05/2020   10:00 AM  Montreal Cognitive Assessment   Visuospatial/ Executive (0/5) 3  Naming (0/3) 3  Attention: Read list of digits (0/2) 2  Attention: Read list of letters (0/1) 1  Attention: Serial 7 subtraction starting at 100 (0/3) 0  Language: Repeat phrase (0/2) 0  Language : Fluency (0/1) 1  Abstraction (0/2) 1  Delayed Recall (0/5) 0  Orientation (0/6) 4  Total 15  Adjusted Score (based on education) 15    Objective:     PHYSICAL EXAMINATION:    VITALS:   Vitals:   01/22/22 1054  Resp: 20  Weight: 125 lb (56.7 kg)  Height: '5\' 2"'$  (1.575 m)    GEN:  The patient appears stated age and is in NAD. HEENT:  Normocephalic, atraumatic.   Neurological examination:  General: NAD,  well-groomed, appears stated age. Orientation: The patient is alert. Oriented to person, place and not to date Cranial nerves: There is good facial symmetry.The speech is fluent and clear, hesitant at times. No aphasia or dysarthria. Fund of knowledge is appropriate. Recent and remote memory are impaired. Attention and concentration are reduced.  Able to name objects and repeat phrases.  Hearing is intact to conversational tone.    Sensation: Sensation is intact to light touch throughout Motor: Strength is at least antigravity x4.  Tremors: none  DTR's 2/4 in UE/LE     Movement examination: Tone: There is normal tone in the UE/LE Abnormal movements:  no tremor.  No myoclonus.  No asterixis.   Coordination:  There is no decremation with RAM's. Normal finger to nose  Gait and Station: The patient has no difficulty arising out of a deep-seated chair without the use of the hands. The patient's stride length is good.  Gait is cautious and narrow.    Thank you for allowing [place] the opportunity to participate in the care of this nice patient. Please do not hesitate to contact us for any questions or concerns.   Total time spent on today's visit was 38 minutes dedicated to this patient today, preparing to see patient, examining the patient, ordering tests and/or medications and counseling the patient, documenting clinical information in the EHR or other health record, independently interpreting results and communicating results to the patient/family, discussing treatment and goals, answering patient's questions and coordinating care.  Cc:  Kristine Prose, MD  Sharene Butters 01/22/2022 12:25 PM

## 2022-01-22 NOTE — Patient Instructions (Signed)
It was a pleasure to see you today at our office.   Recommendations:  Follow up in  6 months Continue donepezil 10 mg daily.  Start Memantine 5 mg tablets 1 tablet twice daily.   Side effects include dizziness, headache, constipation.   Repeat neurocognitive testing   Whom to call:  Memory  decline, memory medications: Call our office [phone]   For psychiatric meds, mood meds: Please have your primary care physician manage these medications.   Counseling regarding caregiver distress, including caregiver depression, anxiety and issues regarding community resources, adult day care programs, adult living facilities, or memory care questions:   Feel free to contact [place], Social Worker at [phone]   For assessment of decision of mental capacity and competency:  Call Dr. Anthoney Harada, geriatric psychiatrist at [phone]  For guidance in geriatric dementia issues please call [facility] [phone]  For guidance regarding [facility] and if placement were needed at the facility, contact Arnell Asal, Social Worker tel: [phone]  Consider [place]  [place], [place] [address] [phone]  Hours of Operation Mondays to Thursdays: 8 am to 8 pm,Fridays: 9 am to 8 pm, Saturdays: 9 am to 1 pm Sundays: Closed  [email or web address]/[email or web address]-[email or web address]/active-[email or web address]/[email or web address]   If you have any severe symptoms of a stroke, or other severe issues such as confusion,severe chills or fever, etc call 911 or go to the ER as you may need to be evaluated further   Feel free to visit Facebook page " [employer]" for tips of how to care for people with memory problems.   Feel free to go to the following database for funded clinical studies conducted around the world: http://saunders.com/   https://www.triadclinicaltrials.com/     RECOMMENDATIONS FOR ALL PATIENTS WITH  MEMORY PROBLEMS: 1. Continue to exercise (Recommend 30 minutes of walking everyday, or 3 hours every week) 2. Increase social interactions - continue going to [place] and enjoy social gatherings with friends and family 3. Eat healthy, avoid fried foods and eat more fruits and vegetables 4. Maintain adequate blood pressure, blood sugar, and blood cholesterol level. Reducing the risk of stroke and cardiovascular disease also helps promoting better memory. 5. Avoid stressful situations. Live a simple life and avoid aggravations. Organize your time and prepare for the next day in anticipation. 6. Sleep well, avoid any interruptions of sleep and avoid any distractions in the bedroom that may interfere with adequate sleep quality 7. Avoid sugar, avoid sweets as there is a strong link between excessive sugar intake, diabetes, and cognitive impairment We discussed the Mediterranean diet, which has been shown to help patients reduce the risk of progressive memory disorders and reduces cardiovascular risk. This includes eating fish, eat fruits and green leafy vegetables, nuts like almonds and hazelnuts, walnuts, and also use olive oil. Avoid fast foods and fried foods as much as possible. Avoid sweets and sugar as sugar use has been linked to worsening of memory function.  There is always a concern of gradual progression of memory problems. If this is the case, then we may need to adjust level of care according to patient needs. Support, both to the patient and caregiver, should then be put into place.    The Alzheimer's Association is here all day, every day for people facing Alzheimer's disease through our free 24/7 Helpline: [phone]. The Helpline provides reliable information and support to all those who need assistance, such as individuals living with memory loss, Alzheimer's or other dementia, caregivers,  health care professionals and the public.  Our highly trained and knowledgeable staff can help you  with: Understanding memory loss, dementia and Alzheimer's  Medications and other treatment options  General information about aging and brain health  Skills to provide quality care and to find the best care from professionals  Legal, financial and living-arrangement decisions Our Helpline also features: Confidential care consultation provided by master's level clinicians who can help with decision-making support, crisis assistance and education on issues families face every day  Help in a caller's preferred language using our translation service that features more than 200 languages and dialects  Referrals to local community programs, services and ongoing support     FALL PRECAUTIONS: Be cautious when walking. Scan the area for obstacles that may increase the risk of trips and falls. When getting up in the mornings, sit up at the edge of the bed for a few minutes before getting out of bed. Consider elevating the bed at the head end to avoid drop of blood pressure when getting up. Walk always in a well-lit room (use night lights in the walls). Avoid area rugs or power cords from appliances in the middle of the walkways. Use a walker or a cane if necessary and consider physical therapy for balance exercise. Get your eyesight checked regularly.  FINANCIAL OVERSIGHT: Supervision, especially oversight when making financial decisions or transactions is also recommended.  HOME SAFETY: Consider the safety of the kitchen when operating appliances like stoves, microwave oven, and blender. Consider having supervision and share cooking responsibilities until no longer able to participate in those. Accidents with firearms and other hazards in the house should be identified and addressed as well.   ABILITY TO BE LEFT ALONE: If patient is unable to contact 911 operator, consider using LifeLine, or when the need is there, arrange for someone to stay with patients. Smoking is a fire hazard, consider supervision  or cessation. Risk of wandering should be assessed by caregiver and if detected at any point, supervision and safe proof recommendations should be instituted.  MEDICATION SUPERVISION: Inability to self-administer medication needs to be constantly addressed. Implement a mechanism to ensure safe administration of the medications.         Mediterranean Diet A Mediterranean diet refers to food and lifestyle choices that are based on the traditions of countries located on the [employer]. This way of eating has been shown to help prevent certain conditions and improve outcomes for people who have chronic diseases, like kidney disease and heart disease. What are tips for following this plan? Lifestyle  Cook and eat meals together with your family, when possible. Drink enough fluid to keep your urine clear or pale yellow. Be physically active every day. This includes: Aerobic exercise like running or swimming. Leisure activities like gardening, walking, or housework. Get 7-8 hours of sleep each night. If recommended by your health care provider, drink red wine in moderation. This means 1 glass a day for nonpregnant women and 2 glasses a day for men. A glass of wine equals 5 oz (150 mL). Reading food labels  Check the serving size of packaged foods. For foods such as rice and pasta, the serving size refers to the amount of cooked product, not dry. Check the total fat in packaged foods. Avoid foods that have saturated fat or trans fats. Check the ingredients list for added sugars, such as corn syrup. Shopping  At the grocery store, buy most of your food from the areas near the  walls of the store. This includes: Fresh fruits and vegetables (produce). Grains, beans, nuts, and seeds. Some of these may be available in unpackaged forms or large amounts (in bulk). Fresh seafood. Poultry and eggs. Low-fat dairy products. Buy whole ingredients instead of prepackaged foods. Buy fresh fruits and  vegetables in-season from local farmers markets. Buy frozen fruits and vegetables in resealable bags. If you do not have access to quality fresh seafood, buy precooked frozen shrimp or canned fish, such as tuna, salmon, or sardines. Buy small amounts of raw or cooked vegetables, salads, or olives from the deli or salad bar at your store. Stock your pantry so you always have certain foods on hand, such as olive oil, canned tuna, canned tomatoes, rice, pasta, and beans. Cooking  Cook foods with extra-virgin olive oil instead of using butter or other vegetable oils. Have meat as a side dish, and have vegetables or grains as your main dish. This means having meat in small portions or adding small amounts of meat to foods like pasta or stew. Use beans or vegetables instead of meat in common dishes like chili or lasagna. Experiment with different cooking methods. Try roasting or broiling vegetables instead of steaming or sauteing them. Add frozen vegetables to soups, stews, pasta, or rice. Add nuts or seeds for added healthy fat at each meal. You can add these to yogurt, salads, or vegetable dishes. Marinate fish or vegetables using olive oil, lemon juice, garlic, and fresh herbs. Meal planning  Plan to eat 1 vegetarian meal one day each week. Try to work up to 2 vegetarian meals, if possible. Eat seafood 2 or more times a week. Have healthy snacks readily available, such as: Vegetable sticks with hummus. Greek yogurt. Fruit and nut trail mix. Eat balanced meals throughout the week. This includes: Fruit: 2-3 servings a day Vegetables: 4-5 servings a day Low-fat dairy: 2 servings a day Fish, poultry, or lean meat: 1 serving a day Beans and legumes: 2 or more servings a week Nuts and seeds: 1-2 servings a day Whole grains: 6-8 servings a day Extra-virgin olive oil: 3-4 servings a day Limit red meat and sweets to only a few servings a month What are my food choices? Mediterranean  diet Recommended Grains: Whole-grain pasta. Brown rice. Bulgar wheat. Polenta. Couscous. Whole-wheat bread. [place]. Vegetables: Artichokes. Beets. Broccoli. Cabbage. Carrots. Eggplant. Green beans. Chard. Kale. Spinach. Onions. Leeks. Peas. Squash. Tomatoes. Peppers. Radishes. Fruits: Apples. Apricots. Avocado. Berries. Bananas. Cherries. Dates. Figs. Grapes. Lemons. Melon. Oranges. Peaches. Plums. Pomegranate. Meats and other protein foods: Beans. Almonds. Sunflower seeds. Pine nuts. Peanuts. [place]. Salmon. Scallops. Shrimp. [place]. Tilapia. Clams. Oysters. Eggs. Dairy: Low-fat milk. Cheese. Greek yogurt. Beverages: Water. Red wine. Herbal tea. Fats and oils: Extra virgin olive oil. Avocado oil. Grape seed oil. Sweets and desserts: [place] yogurt with honey. Baked apples. Poached pears. Trail mix. Seasoning and other foods: Basil. Cilantro. Coriander. Cumin. Mint. Parsley. Sage. Rosemary. Tarragon. Garlic. Oregano. Thyme. Pepper. Balsalmic vinegar. Tahini. Hummus. Tomato sauce. Olives. Mushrooms. Limit these Grains: Prepackaged pasta or rice dishes. Prepackaged cereal with added sugar. Vegetables: Deep fried potatoes (french fries). Fruits: Fruit canned in syrup. Meats and other protein foods: Beef. Pork. Lamb. Poultry with skin. Hot dogs. [place] Salines. Dairy: Ice cream. Sour cream. Whole milk. Beverages: Juice. Sugar-sweetened soft drinks. Beer. Liquor and spirits. Fats and oils: Butter. Canola oil. Vegetable oil. Beef fat (tallow). Lard. Sweets and desserts: Cookies. Cakes. Pies. Candy. Seasoning and other foods: Mayonnaise. Premade sauces and marinades. The items  listed may not be a complete list. Talk with your dietitian about what dietary choices are right for you. Summary The Mediterranean diet includes both food and lifestyle choices. Eat a variety of fresh fruits and vegetables, beans, nuts, seeds, and whole grains. Limit the amount of red meat and sweets that you eat. Talk with your  health care provider about whether it is safe for you to drink red wine in moderation. This means 1 glass a day for nonpregnant women and 2 glasses a day for men. A glass of wine equals 5 oz (150 mL). This information is not intended to replace advice given to you by your health care provider. Make sure you discuss any questions you have with your health care provider. Document Released: 09/21/2015 Document Revised: 10/24/2015 Document Reviewed: 09/21/2015 Elsevier Interactive Patient Education  2017 [employer].

## 2022-01-24 DIAGNOSIS — M533 Sacrococcygeal disorders, not elsewhere classified: Secondary | ICD-10-CM | POA: Diagnosis not present

## 2022-02-22 DIAGNOSIS — G309 Alzheimer's disease, unspecified: Secondary | ICD-10-CM | POA: Diagnosis not present

## 2022-02-22 DIAGNOSIS — W19XXXA Unspecified fall, initial encounter: Secondary | ICD-10-CM | POA: Diagnosis not present

## 2022-02-22 DIAGNOSIS — S0083XA Contusion of other part of head, initial encounter: Secondary | ICD-10-CM | POA: Diagnosis not present

## 2022-02-22 DIAGNOSIS — M533 Sacrococcygeal disorders, not elsewhere classified: Secondary | ICD-10-CM | POA: Diagnosis not present

## 2022-03-07 ENCOUNTER — Telehealth: Payer: Self-pay | Admitting: Physician Assistant

## 2022-03-07 NOTE — Telephone Encounter (Signed)
Pt's partner called in wanting to know what specific benefit the neuropsych testing can do for the patient. The previous testing was very hard for her emotionally and they are wanting to make sure this will benefit her

## 2022-03-08 NOTE — Telephone Encounter (Signed)
I left detailed message to call office if any further questions.

## 2022-03-13 ENCOUNTER — Encounter: Payer: Medicare PPO | Admitting: Psychology

## 2022-03-20 ENCOUNTER — Encounter: Payer: Medicare PPO | Admitting: Psychology

## 2022-04-29 DIAGNOSIS — M461 Sacroiliitis, not elsewhere classified: Secondary | ICD-10-CM | POA: Diagnosis not present

## 2022-04-29 DIAGNOSIS — M47816 Spondylosis without myelopathy or radiculopathy, lumbar region: Secondary | ICD-10-CM | POA: Diagnosis not present

## 2022-05-14 DIAGNOSIS — M461 Sacroiliitis, not elsewhere classified: Secondary | ICD-10-CM | POA: Diagnosis not present

## 2022-05-24 DIAGNOSIS — M545 Low back pain, unspecified: Secondary | ICD-10-CM | POA: Diagnosis not present

## 2022-05-24 DIAGNOSIS — M533 Sacrococcygeal disorders, not elsewhere classified: Secondary | ICD-10-CM | POA: Diagnosis not present

## 2022-06-05 DIAGNOSIS — M533 Sacrococcygeal disorders, not elsewhere classified: Secondary | ICD-10-CM | POA: Diagnosis not present

## 2022-06-05 DIAGNOSIS — M7918 Myalgia, other site: Secondary | ICD-10-CM | POA: Diagnosis not present

## 2022-06-05 DIAGNOSIS — G894 Chronic pain syndrome: Secondary | ICD-10-CM | POA: Diagnosis not present

## 2022-06-05 DIAGNOSIS — Z9889 Other specified postprocedural states: Secondary | ICD-10-CM | POA: Diagnosis not present

## 2022-06-06 DIAGNOSIS — R262 Difficulty in walking, not elsewhere classified: Secondary | ICD-10-CM | POA: Diagnosis not present

## 2022-06-06 DIAGNOSIS — M545 Low back pain, unspecified: Secondary | ICD-10-CM | POA: Diagnosis not present

## 2022-06-06 DIAGNOSIS — M25552 Pain in left hip: Secondary | ICD-10-CM | POA: Diagnosis not present

## 2022-06-10 DIAGNOSIS — M25552 Pain in left hip: Secondary | ICD-10-CM | POA: Diagnosis not present

## 2022-06-10 DIAGNOSIS — M545 Low back pain, unspecified: Secondary | ICD-10-CM | POA: Diagnosis not present

## 2022-06-10 DIAGNOSIS — R262 Difficulty in walking, not elsewhere classified: Secondary | ICD-10-CM | POA: Diagnosis not present

## 2022-06-13 DIAGNOSIS — R262 Difficulty in walking, not elsewhere classified: Secondary | ICD-10-CM | POA: Diagnosis not present

## 2022-06-13 DIAGNOSIS — M25552 Pain in left hip: Secondary | ICD-10-CM | POA: Diagnosis not present

## 2022-06-13 DIAGNOSIS — M545 Low back pain, unspecified: Secondary | ICD-10-CM | POA: Diagnosis not present

## 2022-06-17 DIAGNOSIS — M545 Low back pain, unspecified: Secondary | ICD-10-CM | POA: Diagnosis not present

## 2022-06-17 DIAGNOSIS — R262 Difficulty in walking, not elsewhere classified: Secondary | ICD-10-CM | POA: Diagnosis not present

## 2022-06-17 DIAGNOSIS — M25552 Pain in left hip: Secondary | ICD-10-CM | POA: Diagnosis not present

## 2022-06-20 DIAGNOSIS — M545 Low back pain, unspecified: Secondary | ICD-10-CM | POA: Diagnosis not present

## 2022-06-20 DIAGNOSIS — R262 Difficulty in walking, not elsewhere classified: Secondary | ICD-10-CM | POA: Diagnosis not present

## 2022-06-20 DIAGNOSIS — M25552 Pain in left hip: Secondary | ICD-10-CM | POA: Diagnosis not present

## 2022-06-24 DIAGNOSIS — R262 Difficulty in walking, not elsewhere classified: Secondary | ICD-10-CM | POA: Diagnosis not present

## 2022-06-24 DIAGNOSIS — M25552 Pain in left hip: Secondary | ICD-10-CM | POA: Diagnosis not present

## 2022-06-24 DIAGNOSIS — M545 Low back pain, unspecified: Secondary | ICD-10-CM | POA: Diagnosis not present

## 2022-06-27 DIAGNOSIS — R262 Difficulty in walking, not elsewhere classified: Secondary | ICD-10-CM | POA: Diagnosis not present

## 2022-06-27 DIAGNOSIS — M545 Low back pain, unspecified: Secondary | ICD-10-CM | POA: Diagnosis not present

## 2022-06-27 DIAGNOSIS — M25552 Pain in left hip: Secondary | ICD-10-CM | POA: Diagnosis not present

## 2022-07-01 DIAGNOSIS — R262 Difficulty in walking, not elsewhere classified: Secondary | ICD-10-CM | POA: Diagnosis not present

## 2022-07-01 DIAGNOSIS — M545 Low back pain, unspecified: Secondary | ICD-10-CM | POA: Diagnosis not present

## 2022-07-01 DIAGNOSIS — M25552 Pain in left hip: Secondary | ICD-10-CM | POA: Diagnosis not present

## 2022-07-03 DIAGNOSIS — M810 Age-related osteoporosis without current pathological fracture: Secondary | ICD-10-CM | POA: Diagnosis not present

## 2022-07-03 DIAGNOSIS — G309 Alzheimer's disease, unspecified: Secondary | ICD-10-CM | POA: Diagnosis not present

## 2022-07-03 DIAGNOSIS — Z1331 Encounter for screening for depression: Secondary | ICD-10-CM | POA: Diagnosis not present

## 2022-07-03 DIAGNOSIS — F0280 Dementia in other diseases classified elsewhere without behavioral disturbance: Secondary | ICD-10-CM | POA: Diagnosis not present

## 2022-07-03 DIAGNOSIS — E785 Hyperlipidemia, unspecified: Secondary | ICD-10-CM | POA: Diagnosis not present

## 2022-07-03 DIAGNOSIS — Z0000 Encounter for general adult medical examination without abnormal findings: Secondary | ICD-10-CM | POA: Diagnosis not present

## 2022-07-04 DIAGNOSIS — R262 Difficulty in walking, not elsewhere classified: Secondary | ICD-10-CM | POA: Diagnosis not present

## 2022-07-04 DIAGNOSIS — M545 Low back pain, unspecified: Secondary | ICD-10-CM | POA: Diagnosis not present

## 2022-07-04 DIAGNOSIS — M25552 Pain in left hip: Secondary | ICD-10-CM | POA: Diagnosis not present

## 2022-07-12 DIAGNOSIS — M545 Low back pain, unspecified: Secondary | ICD-10-CM | POA: Diagnosis not present

## 2022-07-12 DIAGNOSIS — M25552 Pain in left hip: Secondary | ICD-10-CM | POA: Diagnosis not present

## 2022-07-12 DIAGNOSIS — R262 Difficulty in walking, not elsewhere classified: Secondary | ICD-10-CM | POA: Diagnosis not present

## 2022-07-24 ENCOUNTER — Ambulatory Visit: Payer: Medicare PPO | Admitting: Physician Assistant

## 2022-07-24 ENCOUNTER — Encounter: Payer: Self-pay | Admitting: Physician Assistant

## 2022-07-24 VITALS — BP 100/67 | HR 61 | Resp 20 | Ht 62.0 in | Wt 125.0 lb

## 2022-07-24 DIAGNOSIS — R55 Syncope and collapse: Secondary | ICD-10-CM | POA: Diagnosis not present

## 2022-07-24 DIAGNOSIS — Z79899 Other long term (current) drug therapy: Secondary | ICD-10-CM | POA: Diagnosis not present

## 2022-07-24 DIAGNOSIS — R110 Nausea: Secondary | ICD-10-CM | POA: Diagnosis not present

## 2022-07-24 DIAGNOSIS — E869 Volume depletion, unspecified: Secondary | ICD-10-CM | POA: Diagnosis not present

## 2022-07-24 DIAGNOSIS — G309 Alzheimer's disease, unspecified: Secondary | ICD-10-CM | POA: Diagnosis not present

## 2022-07-24 DIAGNOSIS — R0902 Hypoxemia: Secondary | ICD-10-CM | POA: Diagnosis not present

## 2022-07-24 DIAGNOSIS — I959 Hypotension, unspecified: Secondary | ICD-10-CM | POA: Diagnosis not present

## 2022-07-24 DIAGNOSIS — E871 Hypo-osmolality and hyponatremia: Secondary | ICD-10-CM | POA: Diagnosis not present

## 2022-07-24 DIAGNOSIS — N179 Acute kidney failure, unspecified: Secondary | ICD-10-CM | POA: Diagnosis not present

## 2022-07-24 DIAGNOSIS — R059 Cough, unspecified: Secondary | ICD-10-CM | POA: Diagnosis not present

## 2022-07-24 DIAGNOSIS — R001 Bradycardia, unspecified: Secondary | ICD-10-CM | POA: Diagnosis not present

## 2022-07-24 DIAGNOSIS — F0280 Dementia in other diseases classified elsewhere without behavioral disturbance: Secondary | ICD-10-CM

## 2022-07-24 MED ORDER — DONEPEZIL HCL 10 MG PO TABS
10.0000 mg | ORAL_TABLET | Freq: Every day | ORAL | 3 refills | Status: DC
Start: 2022-07-24 — End: 2023-08-05

## 2022-07-24 MED ORDER — MEMANTINE HCL 10 MG PO TABS: ORAL_TABLET | ORAL | 11 refills | Status: DC

## 2022-07-24 NOTE — Progress Notes (Signed)
Assessment/Plan:   Dementia likely due to Alzheimer's disease  Kristine Stafford is a very pleasant 72 y.o. RH female with a history of migraines, hypothyroidism, hyperlipidemia, hypertension, arthritis with chronic pain syndrome followed at the pain clinic, and a history of major neurodegenerative disorder likely due to Alzheimer's disease per neurocognitive exam October 2022 seen today in follow up for memory loss. Patient is currently on donepezil 10 mg daily and memantine 5 mg twice daily.     Recommendations:   Follow up in 6  months. Continue donepezil 10 mg daily and increase memantine 10 mg twice daily, side effects discussed  Continue B12 supplements Recommend good control of her cardiovascular risk factors Continue to control mood as per PCP    Subjective:    This patient is accompanied in the office by Kristine Stafford  who supplements the history.  Previous records as well as any outside records available were reviewed prior to todays visit. Patient was last seen on 01/22/2022.  Last MMSE on 07/23/2021 was 15/30    Any changes in memory since last visit?  "Her STM is worse , LTM not as radically".  She continues to have difficulty remembering the names of simple objects, names of people and difficulty with fluency.  She continues to show hoarding behavior. Repeats oneself?  Endorsed Disoriented when walking into a room?  Patient denies    Leaving objects in unusual places?  She reported hoarding old objects, art work.  She puts dishes in different places  Wandering behavior?  denies   Any personality changes since last visit?  denies.  Occasionally she may get "snappy, not very frequently" Any worsening depression?: Kristine Stafford addresses but denies, sometimes the pain affects the mood. She has an appt with PCP to discuss this issue.  Hallucinations or paranoia?  denies   Seizures?    denies    Any sleep changes? "Sleeps well, lately better"  Denies vivid dreams, REM behavior or sleepwalking    Sleep apnea?   denies   Any hygiene concerns?    denies   Independent of bathing and dressing?  He helps her washing the hair  Does the patient needs help with medications?  Kristine Stafford is in charge Who is in charge of the finances? Kristine Stafford  is in charge    Any changes in appetite?  "Really good " Patient have trouble swallowing?  denies   Does the patient cook?   Any kitchen accidents such as leaving the stove on? Patient denies   Any headaches?   denies   Chronic back pain ?  Endorsed, due to lumbar radiculopathy followed by orthopedics Ambulates with difficulty?  She walks slowly due to chronic lumbar, hip and leg pain, follow-up with orthopedics and at the pain clinic.  "She walks very little outside".  "Slow, shuffles, has more difficulty with steps".  Recent falls or head injuries? Denies, 3 months ago while gardening, but no LOC or Head injury       Unilateral weakness, numbness or tingling?  denies   Any tremors? only if nervous Any anosmia?  Patient denies   Any incontinence of urine?  denies   Any bowel dysfunction?  Occasional constipation Patient lives with Kristine Stafford.   Does the patient drive? No longer drives   Initial Evaluation 09/05/20 The patient is seen in neurologic consultation at the request of Deatra James, MD for the evaluation of memory.  The patient is accompanied by boyfriend Kristine Stafford who supplements the history. She is a 72 y.o.  year old female who has had memory issues for about  1 year where she has noticed worsening short term recall issues. Kristine Stafford reports that she is increasingly asking the same questions and repeating the same stories. " She cannot retain new information". She lives with Kristine Stafford for the last 40 years. He noticed increased hoarding old objects and she adds "this is clogging my life". "She appears more stressed than depressed and Covid pandemic adds to it "-he says. Denies irritability. Her sleep pattern is "erratic", goes to sleep late and wakes up during the night, but she is  trying to change her schedule to go to sleep at the same time. Denies acting out in dreams or sleepwalking. Denies hallucinations or paranoia. She is independent of dressing and bathing. Kristine Stafford manages her medications because she has a tendency to forget. He also manages the bill because she has missed some payments and he does the driving after she could not remember how to get from the pharmacy to the grocery store. A few months back, she made a trip to [place] and took the wrong turn, so she voluntarily surrendered the driving. She cooks occasionally, denies leaving the stove or faucet on. Appetite is good and denies trouble swallowing.  Denies leaving objects in unusual places. Ambulates without difficulty without a cane or walker.  Denies headaches or  falls. She had injury to the head at home, with a folding table about 1 y ago, without LOC. No visit to the ER or imaging was reported. Denies double vision, dizziness, focal numbness or tingling, unilateral weakness or tremors. Denies urine incontinence or retention. Denies constipation or diarrhea.  Denies anosmia. Denies history of OSA, ETOH  or Tobacco. Family History Dad dementia 90s AD. College education. Former Comptroller.    Brain MRI on 09/20/2020 revealed generalized cerebral volume loss of unspecified severity, as well as less than typical small vessel ischemic changes for her age.     Neurocognitive Exam 11/23/20 Briefly, results suggested diffuse, severe cognitive impairment with performances commonly scoring in the exceptionally low normative range. She did reasonably well on an isolated line orientation task and exhibited some performance variability across processing speed and confrontation naming. However, in the case of the former two domains, performance was below expectation overall. Performance was consistently impaired across domains of basic attention, cognitive flexibility, safety/judgment, receptive language, verbal fluency,  visuoconstructional abilities, and all aspects of learning and memory. Unfortunately, the most likely etiology at the present time is Alzheimer's disease. When initially learning novel information, Ms. Dozier did not benefit from repeated learning trials. After a brief delay, she was fully amnestic (i.e., 0% retention) across all three memory tasks. She further displayed poor performance across yes/no recognition trials. Taken together, this suggests a severe memory storage deficit, which is the hallmark characteristic of Alzheimer's disease.   TSH 09/05/20 3.44 and B12 228     PREVIOUS MEDICATIONS:   CURRENT MEDICATIONS:  Outpatient Encounter Medications as of 07/24/2022  Medication Sig   acetaminophen (TYLENOL) 500 MG tablet Take 1,000 mg by mouth every 6 (six) hours as needed (for pain.).   alendronate (FOSAMAX) 70 MG tablet Take 70 mg by mouth every Tuesday. Take with a full glass of water on an empty stomach.   atorvastatin (LIPITOR) 20 MG tablet Take 20 mg by mouth in the morning.   Calcium Carbonate (CALCIUM 600 PO) Take 600 mg by mouth in the morning.   Cholecalciferol (VITAMIN D3) 125 MCG (5000 UT) TABS Take 5,000 Units by  mouth in the morning.   gabapentin (NEURONTIN) 100 MG capsule Take 100-200 mg by mouth 2 (two) times daily as needed for pain.   HYDROcodone-acetaminophen (NORCO/VICODIN) 5-325 MG tablet Take 1 tablet by mouth every 6 (six) hours as needed for moderate pain or severe pain.   Menthol, Topical Analgesic, (BIOFREEZE EX) Apply 1 Application topically 3 (three) times daily as needed (back pain.).   methocarbamol (ROBAXIN) 750 MG tablet Take 1 tablet (750 mg total) by mouth every 6 (six) hours as needed for muscle spasms.   Polyethyl Glycol-Propyl Glycol (LUBRICANT EYE DROPS) 0.4-0.3 % SOLN Place 1-2 drops into both eyes 3 (three) times daily as needed (dry/irritated eyes.).   traMADol (ULTRAM) 50 MG tablet Take 50 mg by mouth 3 (three) times daily as needed (pain.).    vitamin B-12 (CYANOCOBALAMIN) 1000 MCG tablet Take 1,000 mcg by mouth in the morning.   [DISCONTINUED] donepezil (ARICEPT) 10 MG tablet Take 1 tablet at 10 mg daily (Patient taking differently: Take 10 mg by mouth at bedtime.)   [DISCONTINUED] memantine (NAMENDA) 5 MG tablet Take 1 tablet (5 mg at night) for 2 weeks, then increase to 1 tablet (5 mg) twice a day   donepezil (ARICEPT) 10 MG tablet Take 1 tablet (10 mg total) by mouth at bedtime.   memantine (NAMENDA) 10 MG tablet Take 1 tablet   twice a day   No facility-administered encounter medications on file as of 07/24/2022.       07/24/2022   12:00 PM 07/23/2021    8:00 PM  MMSE - Mini Mental State Exam  Orientation to time 0 1  Orientation to Place 0 3  Registration 3 2  Attention/ Calculation 0 2  Recall 0 0  Language- name 2 objects 2 1  Language- repeat 1 1  Language- follow 3 step command 3 3  Language- read & follow direction 1 1  Write a sentence 1 1  Copy design 0 0  Total score 11 15      09/05/2020   10:00 AM  Montreal Cognitive Assessment   Visuospatial/ Executive (0/5) 3  Naming (0/3) 3  Attention: Read list of digits (0/2) 2  Attention: Read list of letters (0/1) 1  Attention: Serial 7 subtraction starting at 100 (0/3) 0  Language: Repeat phrase (0/2) 0  Language : Fluency (0/1) 1  Abstraction (0/2) 1  Delayed Recall (0/5) 0  Orientation (0/6) 4  Total 15  Adjusted Score (based on education) 15    Objective:     PHYSICAL EXAMINATION:    VITALS:   Vitals:   07/24/22 1049 07/24/22 1136  BP: (!) 77/45 100/67  Pulse: 61   Resp: 20   SpO2: 96%   Weight: 125 lb (56.7 kg)   Height: 5\' 2"  (1.575 m)     GEN:  The patient appears stated age and is in NAD. HEENT:  Normocephalic, atraumatic.   Neurological examination:  General: NAD, well-groomed, appears stated age. Orientation: The patient is alert. Oriented to person, not to place or date Cranial nerves: There is good facial symmetry.The speech  is fluent and clear. No aphasia or dysarthria. Fund of knowledge is reduced. Recent and remote memory are impaired. Attention and concentration are reduced.  Able to name objects and has difficulty repeating phrases.  Hearing is intact to conversational tone.   Sensation: Sensation is intact to light touch throughout Motor: Strength is at least antigravity x4. DTR's 2/4 in UE/LE     Movement examination: Tone:  There is normal tone in the UE/LE Abnormal movements:  no tremor.  No myoclonus.  No asterixis.   Coordination:  There is no decremation with RAM's. Normal finger to nose  Gait and Station: The patient has difficulty arising out of a deep-seated chair without the use of the hands. The patient's stride length is shorter and more cautious, gait not as stable as prior visit, narrow.      Thank you for allowing [place] the opportunity to participate in the care of this nice patient. Please do not hesitate to contact us for any questions or concerns.   Total time spent on today's visit was 30 minutes dedicated to this patient today, preparing to see patient, examining the patient, ordering tests and/or medications and counseling the patient, documenting clinical information in the EHR or other health record, independently interpreting results and communicating results to the patient/family, discussing treatment and goals, answering patient's questions and coordinating care.  Cc:  Deatra James, MD  Marlowe Kays 07/24/2022 12:24 PM

## 2022-07-24 NOTE — Patient Instructions (Addendum)
It was a pleasure to see you today at our office.   Recommendations:  Follow up in  6 months Continue donepezil 10 mg daily.  Increase  memantine to 10  mg tablets 1 tablet twice daily.     Increase water intake   Whom to call:  Memory  decline, memory medications: Call our office [phone]   For psychiatric meds, mood meds: Please have your primary care physician manage these medications.   Counseling regarding caregiver distress, including caregiver depression, anxiety and issues regarding community resources, adult day care programs, adult living facilities, or memory care questions:   Feel free to contact Misty Lisabeth Register, Social Worker at [phone]   For assessment of decision of mental capacity and competency:  Call Dr. Erick Blinks, geriatric psychiatrist at [phone]  For guidance in geriatric dementia issues please call [facility] [phone]  For guidance regarding [facility] and if placement were needed at the facility, contact Sidney Ace, Social Worker tel: [phone]  Consider [facility]  [address], [place] [address] [phone]  Hours of Operation Mondays to Thursdays: 8 am to 8 pm,Fridays: 9 am to 8 pm, Saturdays: 9 am to 1 pm Sundays: Closed  [email or web address]   If you have any severe symptoms of a stroke, or other severe issues such as confusion,severe chills or fever, etc call 911 or go to the ER as you may need to be evaluated further   Feel free to visit Facebook page " [employer]" for tips of how to care for people with memory problems.   Feel free to go to the following database for funded clinical studies conducted around the world: RankChecks.se   https://www.triadclinicaltrials.com/     RECOMMENDATIONS FOR ALL PATIENTS WITH MEMORY PROBLEMS: 1. Continue to exercise (Recommend 30  minutes of walking everyday, or 3 hours every week) 2. Increase social interactions - continue going to [place] and enjoy social gatherings with friends and family 3. Eat healthy, avoid fried foods and eat more fruits and vegetables 4. Maintain adequate blood pressure, blood sugar, and blood cholesterol level. Reducing the risk of stroke and cardiovascular disease also helps promoting better memory. 5. Avoid stressful situations. Live a simple life and avoid aggravations. Organize your time and prepare for the next day in anticipation. 6. Sleep well, avoid any interruptions of sleep and avoid any distractions in the bedroom that may interfere with adequate sleep quality 7. Avoid sugar, avoid sweets as there is a strong link between excessive sugar intake, diabetes, and cognitive impairment We discussed the Mediterranean diet, which has been shown to help patients reduce the risk of progressive memory disorders and reduces cardiovascular risk. This includes eating fish, eat fruits and green leafy vegetables, nuts like almonds and hazelnuts, walnuts, and also use olive oil. Avoid fast foods and fried foods as much as possible. Avoid sweets and sugar as sugar use has been linked to worsening of memory function.  There is always a concern of gradual progression of memory problems. If this is the case, then we may need to adjust level of care according to patient needs. Support, both to the patient and caregiver, should then be put into place.    The Alzheimer's Association is here all day, every day for people facing Alzheimer's disease through our free 24/7 Helpline: [phone]. The Helpline provides reliable information and support to all those who need assistance, such as individuals living with memory loss, Alzheimer's or other dementia, caregivers, health care professionals  and the public.  Our highly trained and knowledgeable staff can help you with: Understanding memory loss, dementia and  Alzheimer's  Medications and other treatment options  General information about aging and brain health  Skills to provide quality care and to find the best care from professionals  Legal, financial and living-arrangement decisions Our Helpline also features: Confidential care consultation provided by master's level clinicians who can help with decision-making support, crisis assistance and education on issues families face every day  Help in a caller's preferred language using our translation service that features more than 200 languages and dialects  Referrals to local community programs, services and ongoing support     FALL PRECAUTIONS: Be cautious when walking. Scan the area for obstacles that may increase the risk of trips and falls. When getting up in the mornings, sit up at the edge of the bed for a few minutes before getting out of bed. Consider elevating the bed at the head end to avoid drop of blood pressure when getting up. Walk always in a well-lit room (use night lights in the walls). Avoid area rugs or power cords from appliances in the middle of the walkways. Use a walker or a cane if necessary and consider physical therapy for balance exercise. Get your eyesight checked regularly.  FINANCIAL OVERSIGHT: Supervision, especially oversight when making financial decisions or transactions is also recommended.  HOME SAFETY: Consider the safety of the kitchen when operating appliances like stoves, microwave oven, and blender. Consider having supervision and share cooking responsibilities until no longer able to participate in those. Accidents with firearms and other hazards in the house should be identified and addressed as well.   ABILITY TO BE LEFT ALONE: If patient is unable to contact 911 operator, consider using LifeLine, or when the need is there, arrange for someone to stay with patients. Smoking is a fire hazard, consider supervision or cessation. Risk of wandering should be  assessed by caregiver and if detected at any point, supervision and safe proof recommendations should be instituted.  MEDICATION SUPERVISION: Inability to self-administer medication needs to be constantly addressed. Implement a mechanism to ensure safe administration of the medications.         Mediterranean Diet A Mediterranean diet refers to food and lifestyle choices that are based on the traditions of countries located on the [employer]. This way of eating has been shown to help prevent certain conditions and improve outcomes for people who have chronic diseases, like kidney disease and heart disease. What are tips for following this plan? Lifestyle  Cook and eat meals together with your family, when possible. Drink enough fluid to keep your urine clear or pale yellow. Be physically active every day. This includes: Aerobic exercise like running or swimming. Leisure activities like gardening, walking, or housework. Get 7-8 hours of sleep each night. If recommended by your health care provider, drink red wine in moderation. This means 1 glass a day for nonpregnant women and 2 glasses a day for men. A glass of wine equals 5 oz (150 mL). Reading food labels  Check the serving size of packaged foods. For foods such as rice and pasta, the serving size refers to the amount of cooked product, not dry. Check the total fat in packaged foods. Avoid foods that have saturated fat or trans fats. Check the ingredients list for added sugars, such as corn syrup. Shopping  At the grocery store, buy most of your food from the areas near the walls of the  store. This includes: Fresh fruits and vegetables (produce). Grains, beans, nuts, and seeds. Some of these may be available in unpackaged forms or large amounts (in bulk). Fresh seafood. Poultry and eggs. Low-fat dairy products. Buy whole ingredients instead of prepackaged foods. Buy fresh fruits and vegetables in-season from local farmers  markets. Buy frozen fruits and vegetables in resealable bags. If you do not have access to quality fresh seafood, buy precooked frozen shrimp or canned fish, such as tuna, salmon, or sardines. Buy small amounts of raw or cooked vegetables, salads, or olives from the deli or salad bar at your store. Stock your pantry so you always have certain foods on hand, such as olive oil, canned tuna, canned tomatoes, rice, pasta, and beans. Cooking  Cook foods with extra-virgin olive oil instead of using butter or other vegetable oils. Have meat as a side dish, and have vegetables or grains as your main dish. This means having meat in small portions or adding small amounts of meat to foods like pasta or stew. Use beans or vegetables instead of meat in common dishes like chili or lasagna. Experiment with different cooking methods. Try roasting or broiling vegetables instead of steaming or sauteing them. Add frozen vegetables to soups, stews, pasta, or rice. Add nuts or seeds for added healthy fat at each meal. You can add these to yogurt, salads, or vegetable dishes. Marinate fish or vegetables using olive oil, lemon juice, garlic, and fresh herbs. Meal planning  Plan to eat 1 vegetarian meal one day each week. Try to work up to 2 vegetarian meals, if possible. Eat seafood 2 or more times a week. Have healthy snacks readily available, such as: Vegetable sticks with hummus. Greek yogurt. Fruit and nut trail mix. Eat balanced meals throughout the week. This includes: Fruit: 2-3 servings a day Vegetables: 4-5 servings a day Low-fat dairy: 2 servings a day Fish, poultry, or lean meat: 1 serving a day Beans and legumes: 2 or more servings a week Nuts and seeds: 1-2 servings a day Whole grains: 6-8 servings a day Extra-virgin olive oil: 3-4 servings a day Limit red meat and sweets to only a few servings a month What are my food choices? Mediterranean diet Recommended Grains: Whole-grain pasta. Brown  rice. Bulgar wheat. Polenta. Couscous. Whole-wheat bread. Orpah Cobb. Vegetables: Artichokes. Beets. Broccoli. Cabbage. Carrots. Eggplant. Green beans. Chard. Kale. Spinach. Onions. Leeks. Peas. Squash. Tomatoes. Peppers. Radishes. Fruits: Apples. Apricots. Avocado. Berries. Bananas. Cherries. Dates. Figs. Grapes. Lemons. Melon. Oranges. Peaches. Plums. Pomegranate. Meats and other protein foods: Beans. Almonds. Sunflower seeds. Pine nuts. Peanuts. Cod. Salmon. Scallops. Shrimp. Tuna. Tilapia. Clams. Oysters. Eggs. Dairy: Low-fat milk. Cheese. Greek yogurt. Beverages: Water. Red wine. Herbal tea. Fats and oils: Extra virgin olive oil. Avocado oil. Grape seed oil. Sweets and desserts: [place] yogurt with honey. Baked apples. Poached pears. Trail mix. Seasoning and other foods: Basil. Cilantro. Coriander. Cumin. Mint. Parsley. Sage. Rosemary. Tarragon. Garlic. Oregano. Thyme. Pepper. Balsalmic vinegar. Tahini. Hummus. Tomato sauce. Olives. Mushrooms. Limit these Grains: Prepackaged pasta or rice dishes. Prepackaged cereal with added sugar. Vegetables: Deep fried potatoes (french fries). Fruits: Fruit canned in syrup. Meats and other protein foods: Beef. Pork. Lamb. Poultry with skin. Hot dogs. Tomasa Blase. Dairy: Ice cream. Sour cream. Whole milk. Beverages: Juice. Sugar-sweetened soft drinks. Beer. Liquor and spirits. Fats and oils: Butter. Canola oil. Vegetable oil. Beef fat (tallow). Lard. Sweets and desserts: Cookies. Cakes. Pies. Candy. Seasoning and other foods: Mayonnaise. Premade sauces and marinades. The items listed may not  be a complete list. Talk with your dietitian about what dietary choices are right for you. Summary The Mediterranean diet includes both food and lifestyle choices. Eat a variety of fresh fruits and vegetables, beans, nuts, seeds, and whole grains. Limit the amount of red meat and sweets that you eat. Talk with your health care provider about whether it is safe for you  to drink red wine in moderation. This means 1 glass a day for nonpregnant women and 2 glasses a day for men. A glass of wine equals 5 oz (150 mL). This information is not intended to replace advice given to you by your health care provider. Make sure you discuss any questions you have with your health care provider. Document Released: 09/21/2015 Document Revised: 10/24/2015 Document Reviewed: 09/21/2015 Elsevier Interactive Patient Education  2017 [employer].

## 2022-07-25 DIAGNOSIS — R059 Cough, unspecified: Secondary | ICD-10-CM | POA: Diagnosis not present

## 2022-07-25 DIAGNOSIS — Z79899 Other long term (current) drug therapy: Secondary | ICD-10-CM | POA: Diagnosis not present

## 2022-07-25 DIAGNOSIS — N179 Acute kidney failure, unspecified: Secondary | ICD-10-CM | POA: Diagnosis not present

## 2022-07-25 DIAGNOSIS — F0280 Dementia in other diseases classified elsewhere without behavioral disturbance: Secondary | ICD-10-CM | POA: Diagnosis not present

## 2022-07-25 DIAGNOSIS — R55 Syncope and collapse: Secondary | ICD-10-CM | POA: Diagnosis not present

## 2022-07-25 DIAGNOSIS — S0990XA Unspecified injury of head, initial encounter: Secondary | ICD-10-CM | POA: Diagnosis not present

## 2022-07-25 DIAGNOSIS — G309 Alzheimer's disease, unspecified: Secondary | ICD-10-CM | POA: Diagnosis not present

## 2022-07-25 DIAGNOSIS — R2681 Unsteadiness on feet: Secondary | ICD-10-CM | POA: Diagnosis not present

## 2022-07-25 DIAGNOSIS — R001 Bradycardia, unspecified: Secondary | ICD-10-CM | POA: Diagnosis not present

## 2022-07-25 DIAGNOSIS — Z0389 Encounter for observation for other suspected diseases and conditions ruled out: Secondary | ICD-10-CM | POA: Diagnosis not present

## 2022-07-25 DIAGNOSIS — E869 Volume depletion, unspecified: Secondary | ICD-10-CM | POA: Diagnosis not present

## 2022-07-25 DIAGNOSIS — I959 Hypotension, unspecified: Secondary | ICD-10-CM | POA: Diagnosis not present

## 2022-07-26 DIAGNOSIS — N179 Acute kidney failure, unspecified: Secondary | ICD-10-CM | POA: Diagnosis not present

## 2022-08-01 DIAGNOSIS — I959 Hypotension, unspecified: Secondary | ICD-10-CM | POA: Diagnosis not present

## 2022-08-01 DIAGNOSIS — N179 Acute kidney failure, unspecified: Secondary | ICD-10-CM | POA: Diagnosis not present

## 2022-08-01 DIAGNOSIS — G309 Alzheimer's disease, unspecified: Secondary | ICD-10-CM | POA: Diagnosis not present

## 2022-08-01 DIAGNOSIS — F0280 Dementia in other diseases classified elsewhere without behavioral disturbance: Secondary | ICD-10-CM | POA: Diagnosis not present

## 2022-08-01 DIAGNOSIS — K5900 Constipation, unspecified: Secondary | ICD-10-CM | POA: Diagnosis not present

## 2022-08-01 DIAGNOSIS — I10 Essential (primary) hypertension: Secondary | ICD-10-CM | POA: Diagnosis not present

## 2022-08-01 DIAGNOSIS — G894 Chronic pain syndrome: Secondary | ICD-10-CM | POA: Diagnosis not present

## 2022-08-01 DIAGNOSIS — M5416 Radiculopathy, lumbar region: Secondary | ICD-10-CM | POA: Diagnosis not present

## 2022-08-01 DIAGNOSIS — R55 Syncope and collapse: Secondary | ICD-10-CM | POA: Diagnosis not present

## 2022-08-06 DIAGNOSIS — G309 Alzheimer's disease, unspecified: Secondary | ICD-10-CM | POA: Diagnosis not present

## 2022-08-06 DIAGNOSIS — F0284 Dementia in other diseases classified elsewhere, unspecified severity, with anxiety: Secondary | ICD-10-CM | POA: Diagnosis not present

## 2022-08-06 DIAGNOSIS — F419 Anxiety disorder, unspecified: Secondary | ICD-10-CM | POA: Diagnosis not present

## 2022-08-06 DIAGNOSIS — I959 Hypotension, unspecified: Secondary | ICD-10-CM | POA: Diagnosis not present

## 2022-08-06 DIAGNOSIS — M5442 Lumbago with sciatica, left side: Secondary | ICD-10-CM | POA: Diagnosis not present

## 2022-08-07 DIAGNOSIS — N179 Acute kidney failure, unspecified: Secondary | ICD-10-CM | POA: Diagnosis not present

## 2022-08-07 DIAGNOSIS — G894 Chronic pain syndrome: Secondary | ICD-10-CM | POA: Diagnosis not present

## 2022-08-07 DIAGNOSIS — M5416 Radiculopathy, lumbar region: Secondary | ICD-10-CM | POA: Diagnosis not present

## 2022-08-07 DIAGNOSIS — G309 Alzheimer's disease, unspecified: Secondary | ICD-10-CM | POA: Diagnosis not present

## 2022-08-07 DIAGNOSIS — F0280 Dementia in other diseases classified elsewhere without behavioral disturbance: Secondary | ICD-10-CM | POA: Diagnosis not present

## 2022-08-07 DIAGNOSIS — I959 Hypotension, unspecified: Secondary | ICD-10-CM | POA: Diagnosis not present

## 2022-08-07 DIAGNOSIS — I10 Essential (primary) hypertension: Secondary | ICD-10-CM | POA: Diagnosis not present

## 2022-08-07 DIAGNOSIS — K5900 Constipation, unspecified: Secondary | ICD-10-CM | POA: Diagnosis not present

## 2022-08-07 DIAGNOSIS — R55 Syncope and collapse: Secondary | ICD-10-CM | POA: Diagnosis not present

## 2022-08-08 DIAGNOSIS — I959 Hypotension, unspecified: Secondary | ICD-10-CM | POA: Diagnosis not present

## 2022-08-08 DIAGNOSIS — K5900 Constipation, unspecified: Secondary | ICD-10-CM | POA: Diagnosis not present

## 2022-08-08 DIAGNOSIS — M5416 Radiculopathy, lumbar region: Secondary | ICD-10-CM | POA: Diagnosis not present

## 2022-08-08 DIAGNOSIS — G894 Chronic pain syndrome: Secondary | ICD-10-CM | POA: Diagnosis not present

## 2022-08-08 DIAGNOSIS — G309 Alzheimer's disease, unspecified: Secondary | ICD-10-CM | POA: Diagnosis not present

## 2022-08-08 DIAGNOSIS — R55 Syncope and collapse: Secondary | ICD-10-CM | POA: Diagnosis not present

## 2022-08-08 DIAGNOSIS — F0280 Dementia in other diseases classified elsewhere without behavioral disturbance: Secondary | ICD-10-CM | POA: Diagnosis not present

## 2022-08-08 DIAGNOSIS — N179 Acute kidney failure, unspecified: Secondary | ICD-10-CM | POA: Diagnosis not present

## 2022-08-08 DIAGNOSIS — I10 Essential (primary) hypertension: Secondary | ICD-10-CM | POA: Diagnosis not present

## 2022-08-16 DIAGNOSIS — G309 Alzheimer's disease, unspecified: Secondary | ICD-10-CM | POA: Diagnosis not present

## 2022-08-16 DIAGNOSIS — F0280 Dementia in other diseases classified elsewhere without behavioral disturbance: Secondary | ICD-10-CM | POA: Diagnosis not present

## 2022-08-16 DIAGNOSIS — N179 Acute kidney failure, unspecified: Secondary | ICD-10-CM | POA: Diagnosis not present

## 2022-08-16 DIAGNOSIS — I959 Hypotension, unspecified: Secondary | ICD-10-CM | POA: Diagnosis not present

## 2022-08-16 DIAGNOSIS — G894 Chronic pain syndrome: Secondary | ICD-10-CM | POA: Diagnosis not present

## 2022-08-16 DIAGNOSIS — K5900 Constipation, unspecified: Secondary | ICD-10-CM | POA: Diagnosis not present

## 2022-08-16 DIAGNOSIS — I10 Essential (primary) hypertension: Secondary | ICD-10-CM | POA: Diagnosis not present

## 2022-08-16 DIAGNOSIS — M5416 Radiculopathy, lumbar region: Secondary | ICD-10-CM | POA: Diagnosis not present

## 2022-08-16 DIAGNOSIS — R55 Syncope and collapse: Secondary | ICD-10-CM | POA: Diagnosis not present

## 2022-08-19 DIAGNOSIS — K5900 Constipation, unspecified: Secondary | ICD-10-CM | POA: Diagnosis not present

## 2022-08-19 DIAGNOSIS — I959 Hypotension, unspecified: Secondary | ICD-10-CM | POA: Diagnosis not present

## 2022-08-19 DIAGNOSIS — I10 Essential (primary) hypertension: Secondary | ICD-10-CM | POA: Diagnosis not present

## 2022-08-19 DIAGNOSIS — N179 Acute kidney failure, unspecified: Secondary | ICD-10-CM | POA: Diagnosis not present

## 2022-08-19 DIAGNOSIS — R55 Syncope and collapse: Secondary | ICD-10-CM | POA: Diagnosis not present

## 2022-08-19 DIAGNOSIS — M5416 Radiculopathy, lumbar region: Secondary | ICD-10-CM | POA: Diagnosis not present

## 2022-08-19 DIAGNOSIS — G894 Chronic pain syndrome: Secondary | ICD-10-CM | POA: Diagnosis not present

## 2022-08-19 DIAGNOSIS — F0280 Dementia in other diseases classified elsewhere without behavioral disturbance: Secondary | ICD-10-CM | POA: Diagnosis not present

## 2022-08-19 DIAGNOSIS — G309 Alzheimer's disease, unspecified: Secondary | ICD-10-CM | POA: Diagnosis not present

## 2022-08-22 ENCOUNTER — Other Ambulatory Visit: Payer: Self-pay | Admitting: Physician Assistant

## 2022-08-22 DIAGNOSIS — R55 Syncope and collapse: Secondary | ICD-10-CM | POA: Diagnosis not present

## 2022-08-22 DIAGNOSIS — I959 Hypotension, unspecified: Secondary | ICD-10-CM | POA: Diagnosis not present

## 2022-08-22 DIAGNOSIS — I10 Essential (primary) hypertension: Secondary | ICD-10-CM | POA: Diagnosis not present

## 2022-08-22 DIAGNOSIS — F0280 Dementia in other diseases classified elsewhere without behavioral disturbance: Secondary | ICD-10-CM | POA: Diagnosis not present

## 2022-08-22 DIAGNOSIS — M5416 Radiculopathy, lumbar region: Secondary | ICD-10-CM | POA: Diagnosis not present

## 2022-08-22 DIAGNOSIS — K5900 Constipation, unspecified: Secondary | ICD-10-CM | POA: Diagnosis not present

## 2022-08-22 DIAGNOSIS — G894 Chronic pain syndrome: Secondary | ICD-10-CM | POA: Diagnosis not present

## 2022-08-22 DIAGNOSIS — N179 Acute kidney failure, unspecified: Secondary | ICD-10-CM | POA: Diagnosis not present

## 2022-08-22 DIAGNOSIS — G309 Alzheimer's disease, unspecified: Secondary | ICD-10-CM | POA: Diagnosis not present

## 2022-08-23 DIAGNOSIS — I10 Essential (primary) hypertension: Secondary | ICD-10-CM | POA: Diagnosis not present

## 2022-08-23 DIAGNOSIS — N179 Acute kidney failure, unspecified: Secondary | ICD-10-CM | POA: Diagnosis not present

## 2022-08-23 DIAGNOSIS — F0280 Dementia in other diseases classified elsewhere without behavioral disturbance: Secondary | ICD-10-CM | POA: Diagnosis not present

## 2022-08-23 DIAGNOSIS — I959 Hypotension, unspecified: Secondary | ICD-10-CM | POA: Diagnosis not present

## 2022-08-23 DIAGNOSIS — R55 Syncope and collapse: Secondary | ICD-10-CM | POA: Diagnosis not present

## 2022-08-23 DIAGNOSIS — G309 Alzheimer's disease, unspecified: Secondary | ICD-10-CM | POA: Diagnosis not present

## 2022-08-23 DIAGNOSIS — K5900 Constipation, unspecified: Secondary | ICD-10-CM | POA: Diagnosis not present

## 2022-08-23 DIAGNOSIS — G894 Chronic pain syndrome: Secondary | ICD-10-CM | POA: Diagnosis not present

## 2022-08-23 DIAGNOSIS — M5416 Radiculopathy, lumbar region: Secondary | ICD-10-CM | POA: Diagnosis not present

## 2022-08-25 DIAGNOSIS — R051 Acute cough: Secondary | ICD-10-CM | POA: Diagnosis not present

## 2022-08-26 DIAGNOSIS — I959 Hypotension, unspecified: Secondary | ICD-10-CM | POA: Diagnosis not present

## 2022-08-26 DIAGNOSIS — R55 Syncope and collapse: Secondary | ICD-10-CM | POA: Diagnosis not present

## 2022-08-26 DIAGNOSIS — I10 Essential (primary) hypertension: Secondary | ICD-10-CM | POA: Diagnosis not present

## 2022-08-26 DIAGNOSIS — F0280 Dementia in other diseases classified elsewhere without behavioral disturbance: Secondary | ICD-10-CM | POA: Diagnosis not present

## 2022-08-26 DIAGNOSIS — G894 Chronic pain syndrome: Secondary | ICD-10-CM | POA: Diagnosis not present

## 2022-08-26 DIAGNOSIS — N179 Acute kidney failure, unspecified: Secondary | ICD-10-CM | POA: Diagnosis not present

## 2022-08-26 DIAGNOSIS — G309 Alzheimer's disease, unspecified: Secondary | ICD-10-CM | POA: Diagnosis not present

## 2022-08-26 DIAGNOSIS — M5416 Radiculopathy, lumbar region: Secondary | ICD-10-CM | POA: Diagnosis not present

## 2022-08-26 DIAGNOSIS — K5900 Constipation, unspecified: Secondary | ICD-10-CM | POA: Diagnosis not present

## 2022-08-27 DIAGNOSIS — M549 Dorsalgia, unspecified: Secondary | ICD-10-CM | POA: Diagnosis not present

## 2022-08-27 DIAGNOSIS — G309 Alzheimer's disease, unspecified: Secondary | ICD-10-CM | POA: Diagnosis not present

## 2022-08-27 DIAGNOSIS — R031 Nonspecific low blood-pressure reading: Secondary | ICD-10-CM | POA: Diagnosis not present

## 2022-08-27 DIAGNOSIS — M47816 Spondylosis without myelopathy or radiculopathy, lumbar region: Secondary | ICD-10-CM | POA: Diagnosis not present

## 2022-08-27 DIAGNOSIS — F419 Anxiety disorder, unspecified: Secondary | ICD-10-CM | POA: Diagnosis not present

## 2022-08-27 DIAGNOSIS — R4689 Other symptoms and signs involving appearance and behavior: Secondary | ICD-10-CM | POA: Diagnosis not present

## 2022-08-30 DIAGNOSIS — H353132 Nonexudative age-related macular degeneration, bilateral, intermediate dry stage: Secondary | ICD-10-CM | POA: Diagnosis not present

## 2022-08-30 DIAGNOSIS — N179 Acute kidney failure, unspecified: Secondary | ICD-10-CM | POA: Diagnosis not present

## 2022-08-30 DIAGNOSIS — H5213 Myopia, bilateral: Secondary | ICD-10-CM | POA: Diagnosis not present

## 2022-08-30 DIAGNOSIS — H524 Presbyopia: Secondary | ICD-10-CM | POA: Diagnosis not present

## 2022-08-30 DIAGNOSIS — G894 Chronic pain syndrome: Secondary | ICD-10-CM | POA: Diagnosis not present

## 2022-08-30 DIAGNOSIS — I959 Hypotension, unspecified: Secondary | ICD-10-CM | POA: Diagnosis not present

## 2022-08-30 DIAGNOSIS — H04123 Dry eye syndrome of bilateral lacrimal glands: Secondary | ICD-10-CM | POA: Diagnosis not present

## 2022-08-30 DIAGNOSIS — H25813 Combined forms of age-related cataract, bilateral: Secondary | ICD-10-CM | POA: Diagnosis not present

## 2022-08-30 DIAGNOSIS — M5416 Radiculopathy, lumbar region: Secondary | ICD-10-CM | POA: Diagnosis not present

## 2022-08-30 DIAGNOSIS — H02423 Myogenic ptosis of bilateral eyelids: Secondary | ICD-10-CM | POA: Diagnosis not present

## 2022-08-30 DIAGNOSIS — R55 Syncope and collapse: Secondary | ICD-10-CM | POA: Diagnosis not present

## 2022-08-30 DIAGNOSIS — I10 Essential (primary) hypertension: Secondary | ICD-10-CM | POA: Diagnosis not present

## 2022-08-30 DIAGNOSIS — H52223 Regular astigmatism, bilateral: Secondary | ICD-10-CM | POA: Diagnosis not present

## 2022-08-30 DIAGNOSIS — F0280 Dementia in other diseases classified elsewhere without behavioral disturbance: Secondary | ICD-10-CM | POA: Diagnosis not present

## 2022-08-30 DIAGNOSIS — G309 Alzheimer's disease, unspecified: Secondary | ICD-10-CM | POA: Diagnosis not present

## 2022-08-30 DIAGNOSIS — K5900 Constipation, unspecified: Secondary | ICD-10-CM | POA: Diagnosis not present

## 2022-08-31 DIAGNOSIS — R55 Syncope and collapse: Secondary | ICD-10-CM | POA: Diagnosis not present

## 2022-08-31 DIAGNOSIS — I10 Essential (primary) hypertension: Secondary | ICD-10-CM | POA: Diagnosis not present

## 2022-08-31 DIAGNOSIS — I959 Hypotension, unspecified: Secondary | ICD-10-CM | POA: Diagnosis not present

## 2022-08-31 DIAGNOSIS — G894 Chronic pain syndrome: Secondary | ICD-10-CM | POA: Diagnosis not present

## 2022-08-31 DIAGNOSIS — F0280 Dementia in other diseases classified elsewhere without behavioral disturbance: Secondary | ICD-10-CM | POA: Diagnosis not present

## 2022-08-31 DIAGNOSIS — K5900 Constipation, unspecified: Secondary | ICD-10-CM | POA: Diagnosis not present

## 2022-08-31 DIAGNOSIS — N179 Acute kidney failure, unspecified: Secondary | ICD-10-CM | POA: Diagnosis not present

## 2022-08-31 DIAGNOSIS — M5416 Radiculopathy, lumbar region: Secondary | ICD-10-CM | POA: Diagnosis not present

## 2022-08-31 DIAGNOSIS — G309 Alzheimer's disease, unspecified: Secondary | ICD-10-CM | POA: Diagnosis not present

## 2022-09-02 DIAGNOSIS — G894 Chronic pain syndrome: Secondary | ICD-10-CM | POA: Diagnosis not present

## 2022-09-02 DIAGNOSIS — F0280 Dementia in other diseases classified elsewhere without behavioral disturbance: Secondary | ICD-10-CM | POA: Diagnosis not present

## 2022-09-02 DIAGNOSIS — G309 Alzheimer's disease, unspecified: Secondary | ICD-10-CM | POA: Diagnosis not present

## 2022-09-02 DIAGNOSIS — I10 Essential (primary) hypertension: Secondary | ICD-10-CM | POA: Diagnosis not present

## 2022-09-02 DIAGNOSIS — K5900 Constipation, unspecified: Secondary | ICD-10-CM | POA: Diagnosis not present

## 2022-09-02 DIAGNOSIS — N179 Acute kidney failure, unspecified: Secondary | ICD-10-CM | POA: Diagnosis not present

## 2022-09-02 DIAGNOSIS — I959 Hypotension, unspecified: Secondary | ICD-10-CM | POA: Diagnosis not present

## 2022-09-02 DIAGNOSIS — R55 Syncope and collapse: Secondary | ICD-10-CM | POA: Diagnosis not present

## 2022-09-02 DIAGNOSIS — M5416 Radiculopathy, lumbar region: Secondary | ICD-10-CM | POA: Diagnosis not present

## 2022-09-06 DIAGNOSIS — M47816 Spondylosis without myelopathy or radiculopathy, lumbar region: Secondary | ICD-10-CM | POA: Diagnosis not present

## 2022-09-15 DIAGNOSIS — R1314 Dysphagia, pharyngoesophageal phase: Secondary | ICD-10-CM | POA: Diagnosis not present

## 2022-09-15 DIAGNOSIS — E861 Hypovolemia: Secondary | ICD-10-CM | POA: Diagnosis not present

## 2022-09-15 DIAGNOSIS — G8929 Other chronic pain: Secondary | ICD-10-CM | POA: Diagnosis not present

## 2022-09-15 DIAGNOSIS — Z66 Do not resuscitate: Secondary | ICD-10-CM | POA: Diagnosis not present

## 2022-09-15 DIAGNOSIS — R2681 Unsteadiness on feet: Secondary | ICD-10-CM | POA: Diagnosis not present

## 2022-09-15 DIAGNOSIS — R41841 Cognitive communication deficit: Secondary | ICD-10-CM | POA: Diagnosis not present

## 2022-09-15 DIAGNOSIS — M6281 Muscle weakness (generalized): Secondary | ICD-10-CM | POA: Diagnosis not present

## 2022-09-15 DIAGNOSIS — I959 Hypotension, unspecified: Secondary | ICD-10-CM | POA: Diagnosis not present

## 2022-09-15 DIAGNOSIS — R0902 Hypoxemia: Secondary | ICD-10-CM | POA: Diagnosis not present

## 2022-09-15 DIAGNOSIS — N390 Urinary tract infection, site not specified: Secondary | ICD-10-CM | POA: Diagnosis not present

## 2022-09-15 DIAGNOSIS — F0280 Dementia in other diseases classified elsewhere without behavioral disturbance: Secondary | ICD-10-CM | POA: Diagnosis not present

## 2022-09-15 DIAGNOSIS — R509 Fever, unspecified: Secondary | ICD-10-CM | POA: Diagnosis not present

## 2022-09-15 DIAGNOSIS — G589 Mononeuropathy, unspecified: Secondary | ICD-10-CM | POA: Diagnosis not present

## 2022-09-15 DIAGNOSIS — G309 Alzheimer's disease, unspecified: Secondary | ICD-10-CM | POA: Diagnosis not present

## 2022-09-15 DIAGNOSIS — M6259 Muscle wasting and atrophy, not elsewhere classified, multiple sites: Secondary | ICD-10-CM | POA: Diagnosis not present

## 2022-09-15 DIAGNOSIS — R9431 Abnormal electrocardiogram [ECG] [EKG]: Secondary | ICD-10-CM | POA: Diagnosis not present

## 2022-09-15 DIAGNOSIS — N3090 Cystitis, unspecified without hematuria: Secondary | ICD-10-CM | POA: Diagnosis not present

## 2022-09-15 DIAGNOSIS — N3001 Acute cystitis with hematuria: Secondary | ICD-10-CM | POA: Diagnosis not present

## 2022-09-15 DIAGNOSIS — I9589 Other hypotension: Secondary | ICD-10-CM | POA: Diagnosis not present

## 2022-09-15 DIAGNOSIS — Z743 Need for continuous supervision: Secondary | ICD-10-CM | POA: Diagnosis not present

## 2022-09-15 DIAGNOSIS — M549 Dorsalgia, unspecified: Secondary | ICD-10-CM | POA: Diagnosis not present

## 2022-09-15 DIAGNOSIS — I10 Essential (primary) hypertension: Secondary | ICD-10-CM | POA: Diagnosis not present

## 2022-09-15 DIAGNOSIS — E871 Hypo-osmolality and hyponatremia: Secondary | ICD-10-CM | POA: Diagnosis not present

## 2022-09-15 DIAGNOSIS — M545 Low back pain, unspecified: Secondary | ICD-10-CM | POA: Diagnosis not present

## 2022-09-15 DIAGNOSIS — F0390 Unspecified dementia without behavioral disturbance: Secondary | ICD-10-CM | POA: Diagnosis not present

## 2022-09-15 DIAGNOSIS — N3000 Acute cystitis without hematuria: Secondary | ICD-10-CM | POA: Diagnosis not present

## 2022-09-18 DIAGNOSIS — G8929 Other chronic pain: Secondary | ICD-10-CM | POA: Diagnosis not present

## 2022-09-18 DIAGNOSIS — E871 Hypo-osmolality and hyponatremia: Secondary | ICD-10-CM | POA: Diagnosis not present

## 2022-09-18 DIAGNOSIS — R1312 Dysphagia, oropharyngeal phase: Secondary | ICD-10-CM | POA: Diagnosis not present

## 2022-09-18 DIAGNOSIS — F03C4 Unspecified dementia, severe, with anxiety: Secondary | ICD-10-CM | POA: Diagnosis not present

## 2022-09-18 DIAGNOSIS — F0390 Unspecified dementia without behavioral disturbance: Secondary | ICD-10-CM | POA: Diagnosis not present

## 2022-09-18 DIAGNOSIS — M810 Age-related osteoporosis without current pathological fracture: Secondary | ICD-10-CM | POA: Diagnosis not present

## 2022-09-18 DIAGNOSIS — M6281 Muscle weakness (generalized): Secondary | ICD-10-CM | POA: Diagnosis not present

## 2022-09-18 DIAGNOSIS — R0902 Hypoxemia: Secondary | ICD-10-CM | POA: Diagnosis not present

## 2022-09-18 DIAGNOSIS — N390 Urinary tract infection, site not specified: Secondary | ICD-10-CM | POA: Diagnosis not present

## 2022-09-18 DIAGNOSIS — R0602 Shortness of breath: Secondary | ICD-10-CM | POA: Diagnosis not present

## 2022-09-18 DIAGNOSIS — I959 Hypotension, unspecified: Secondary | ICD-10-CM | POA: Diagnosis not present

## 2022-09-18 DIAGNOSIS — R1314 Dysphagia, pharyngoesophageal phase: Secondary | ICD-10-CM | POA: Diagnosis not present

## 2022-09-18 DIAGNOSIS — N3090 Cystitis, unspecified without hematuria: Secondary | ICD-10-CM | POA: Diagnosis not present

## 2022-09-18 DIAGNOSIS — M545 Low back pain, unspecified: Secondary | ICD-10-CM | POA: Diagnosis not present

## 2022-09-18 DIAGNOSIS — F5101 Primary insomnia: Secondary | ICD-10-CM | POA: Diagnosis not present

## 2022-09-18 DIAGNOSIS — I10 Essential (primary) hypertension: Secondary | ICD-10-CM | POA: Diagnosis not present

## 2022-09-18 DIAGNOSIS — R41841 Cognitive communication deficit: Secondary | ICD-10-CM | POA: Diagnosis not present

## 2022-09-18 DIAGNOSIS — M5416 Radiculopathy, lumbar region: Secondary | ICD-10-CM | POA: Diagnosis not present

## 2022-09-18 DIAGNOSIS — Z743 Need for continuous supervision: Secondary | ICD-10-CM | POA: Diagnosis not present

## 2022-09-18 DIAGNOSIS — K219 Gastro-esophageal reflux disease without esophagitis: Secondary | ICD-10-CM | POA: Diagnosis not present

## 2022-09-18 DIAGNOSIS — G309 Alzheimer's disease, unspecified: Secondary | ICD-10-CM | POA: Diagnosis not present

## 2022-09-18 DIAGNOSIS — F0280 Dementia in other diseases classified elsewhere without behavioral disturbance: Secondary | ICD-10-CM | POA: Diagnosis not present

## 2022-09-18 DIAGNOSIS — Z7189 Other specified counseling: Secondary | ICD-10-CM | POA: Diagnosis not present

## 2022-09-18 DIAGNOSIS — G894 Chronic pain syndrome: Secondary | ICD-10-CM | POA: Diagnosis not present

## 2022-09-18 DIAGNOSIS — B9620 Unspecified Escherichia coli [E. coli] as the cause of diseases classified elsewhere: Secondary | ICD-10-CM | POA: Diagnosis not present

## 2022-09-18 DIAGNOSIS — R0989 Other specified symptoms and signs involving the circulatory and respiratory systems: Secondary | ICD-10-CM | POA: Diagnosis not present

## 2022-09-18 DIAGNOSIS — R2681 Unsteadiness on feet: Secondary | ICD-10-CM | POA: Diagnosis not present

## 2022-09-18 DIAGNOSIS — R09A2 Foreign body sensation, throat: Secondary | ICD-10-CM | POA: Diagnosis not present

## 2022-09-18 DIAGNOSIS — M6259 Muscle wasting and atrophy, not elsewhere classified, multiple sites: Secondary | ICD-10-CM | POA: Diagnosis not present

## 2022-09-19 DIAGNOSIS — G309 Alzheimer's disease, unspecified: Secondary | ICD-10-CM | POA: Diagnosis not present

## 2022-09-19 DIAGNOSIS — E871 Hypo-osmolality and hyponatremia: Secondary | ICD-10-CM | POA: Diagnosis not present

## 2022-09-19 DIAGNOSIS — B9620 Unspecified Escherichia coli [E. coli] as the cause of diseases classified elsewhere: Secondary | ICD-10-CM | POA: Diagnosis not present

## 2022-09-19 DIAGNOSIS — M545 Low back pain, unspecified: Secondary | ICD-10-CM | POA: Diagnosis not present

## 2022-09-19 DIAGNOSIS — N390 Urinary tract infection, site not specified: Secondary | ICD-10-CM | POA: Diagnosis not present

## 2022-09-19 DIAGNOSIS — F0280 Dementia in other diseases classified elsewhere without behavioral disturbance: Secondary | ICD-10-CM | POA: Diagnosis not present

## 2022-09-19 DIAGNOSIS — G8929 Other chronic pain: Secondary | ICD-10-CM | POA: Diagnosis not present

## 2022-09-19 DIAGNOSIS — M5416 Radiculopathy, lumbar region: Secondary | ICD-10-CM | POA: Diagnosis not present

## 2022-09-19 DIAGNOSIS — I959 Hypotension, unspecified: Secondary | ICD-10-CM | POA: Diagnosis not present

## 2022-09-20 DIAGNOSIS — R41841 Cognitive communication deficit: Secondary | ICD-10-CM | POA: Diagnosis not present

## 2022-09-20 DIAGNOSIS — K219 Gastro-esophageal reflux disease without esophagitis: Secondary | ICD-10-CM | POA: Diagnosis not present

## 2022-09-20 DIAGNOSIS — G894 Chronic pain syndrome: Secondary | ICD-10-CM | POA: Diagnosis not present

## 2022-09-20 DIAGNOSIS — G309 Alzheimer's disease, unspecified: Secondary | ICD-10-CM | POA: Diagnosis not present

## 2022-09-20 DIAGNOSIS — M810 Age-related osteoporosis without current pathological fracture: Secondary | ICD-10-CM | POA: Diagnosis not present

## 2022-09-20 DIAGNOSIS — R1312 Dysphagia, oropharyngeal phase: Secondary | ICD-10-CM | POA: Diagnosis not present

## 2022-09-20 DIAGNOSIS — M6281 Muscle weakness (generalized): Secondary | ICD-10-CM | POA: Diagnosis not present

## 2022-09-20 DIAGNOSIS — F0280 Dementia in other diseases classified elsewhere without behavioral disturbance: Secondary | ICD-10-CM | POA: Diagnosis not present

## 2022-09-20 DIAGNOSIS — R09A2 Foreign body sensation, throat: Secondary | ICD-10-CM | POA: Diagnosis not present

## 2022-09-23 DIAGNOSIS — R1312 Dysphagia, oropharyngeal phase: Secondary | ICD-10-CM | POA: Diagnosis not present

## 2022-09-23 DIAGNOSIS — M6281 Muscle weakness (generalized): Secondary | ICD-10-CM | POA: Diagnosis not present

## 2022-09-23 DIAGNOSIS — R41841 Cognitive communication deficit: Secondary | ICD-10-CM | POA: Diagnosis not present

## 2022-09-23 DIAGNOSIS — G894 Chronic pain syndrome: Secondary | ICD-10-CM | POA: Diagnosis not present

## 2022-09-23 DIAGNOSIS — G309 Alzheimer's disease, unspecified: Secondary | ICD-10-CM | POA: Diagnosis not present

## 2022-09-25 DIAGNOSIS — R1312 Dysphagia, oropharyngeal phase: Secondary | ICD-10-CM | POA: Diagnosis not present

## 2022-09-25 DIAGNOSIS — G894 Chronic pain syndrome: Secondary | ICD-10-CM | POA: Diagnosis not present

## 2022-09-25 DIAGNOSIS — R41841 Cognitive communication deficit: Secondary | ICD-10-CM | POA: Diagnosis not present

## 2022-09-25 DIAGNOSIS — M6281 Muscle weakness (generalized): Secondary | ICD-10-CM | POA: Diagnosis not present

## 2022-09-25 DIAGNOSIS — G309 Alzheimer's disease, unspecified: Secondary | ICD-10-CM | POA: Diagnosis not present

## 2022-09-27 DIAGNOSIS — Z7189 Other specified counseling: Secondary | ICD-10-CM | POA: Diagnosis not present

## 2022-09-27 DIAGNOSIS — M810 Age-related osteoporosis without current pathological fracture: Secondary | ICD-10-CM | POA: Diagnosis not present

## 2022-09-27 DIAGNOSIS — R09A2 Foreign body sensation, throat: Secondary | ICD-10-CM | POA: Diagnosis not present

## 2022-09-27 DIAGNOSIS — E871 Hypo-osmolality and hyponatremia: Secondary | ICD-10-CM | POA: Diagnosis not present

## 2022-09-27 DIAGNOSIS — G309 Alzheimer's disease, unspecified: Secondary | ICD-10-CM | POA: Diagnosis not present

## 2022-09-27 DIAGNOSIS — I959 Hypotension, unspecified: Secondary | ICD-10-CM | POA: Diagnosis not present

## 2022-09-27 DIAGNOSIS — F0280 Dementia in other diseases classified elsewhere without behavioral disturbance: Secondary | ICD-10-CM | POA: Diagnosis not present

## 2022-09-27 DIAGNOSIS — R0902 Hypoxemia: Secondary | ICD-10-CM | POA: Diagnosis not present

## 2022-09-30 ENCOUNTER — Other Ambulatory Visit (HOSPITAL_COMMUNITY): Payer: Self-pay | Admitting: *Deleted

## 2022-09-30 DIAGNOSIS — R1310 Dysphagia, unspecified: Secondary | ICD-10-CM

## 2022-09-30 DIAGNOSIS — M6281 Muscle weakness (generalized): Secondary | ICD-10-CM | POA: Diagnosis not present

## 2022-09-30 DIAGNOSIS — G894 Chronic pain syndrome: Secondary | ICD-10-CM | POA: Diagnosis not present

## 2022-09-30 DIAGNOSIS — G309 Alzheimer's disease, unspecified: Secondary | ICD-10-CM | POA: Diagnosis not present

## 2022-09-30 DIAGNOSIS — R41841 Cognitive communication deficit: Secondary | ICD-10-CM | POA: Diagnosis not present

## 2022-09-30 DIAGNOSIS — R1312 Dysphagia, oropharyngeal phase: Secondary | ICD-10-CM | POA: Diagnosis not present

## 2022-10-02 DIAGNOSIS — K219 Gastro-esophageal reflux disease without esophagitis: Secondary | ICD-10-CM | POA: Diagnosis not present

## 2022-10-02 DIAGNOSIS — M810 Age-related osteoporosis without current pathological fracture: Secondary | ICD-10-CM | POA: Diagnosis not present

## 2022-10-02 DIAGNOSIS — F0280 Dementia in other diseases classified elsewhere without behavioral disturbance: Secondary | ICD-10-CM | POA: Diagnosis not present

## 2022-10-02 DIAGNOSIS — G894 Chronic pain syndrome: Secondary | ICD-10-CM | POA: Diagnosis not present

## 2022-10-02 DIAGNOSIS — R41841 Cognitive communication deficit: Secondary | ICD-10-CM | POA: Diagnosis not present

## 2022-10-02 DIAGNOSIS — R1312 Dysphagia, oropharyngeal phase: Secondary | ICD-10-CM | POA: Diagnosis not present

## 2022-10-02 DIAGNOSIS — E871 Hypo-osmolality and hyponatremia: Secondary | ICD-10-CM | POA: Diagnosis not present

## 2022-10-02 DIAGNOSIS — R09A2 Foreign body sensation, throat: Secondary | ICD-10-CM | POA: Diagnosis not present

## 2022-10-02 DIAGNOSIS — I959 Hypotension, unspecified: Secondary | ICD-10-CM | POA: Diagnosis not present

## 2022-10-02 DIAGNOSIS — G309 Alzheimer's disease, unspecified: Secondary | ICD-10-CM | POA: Diagnosis not present

## 2022-10-02 DIAGNOSIS — M545 Low back pain, unspecified: Secondary | ICD-10-CM | POA: Diagnosis not present

## 2022-10-02 DIAGNOSIS — M6281 Muscle weakness (generalized): Secondary | ICD-10-CM | POA: Diagnosis not present

## 2022-10-02 DIAGNOSIS — G8929 Other chronic pain: Secondary | ICD-10-CM | POA: Diagnosis not present

## 2022-10-08 ENCOUNTER — Encounter (HOSPITAL_COMMUNITY): Payer: Self-pay

## 2022-10-08 ENCOUNTER — Encounter (HOSPITAL_COMMUNITY): Payer: Medicare PPO

## 2022-10-09 DIAGNOSIS — D519 Vitamin B12 deficiency anemia, unspecified: Secondary | ICD-10-CM | POA: Diagnosis not present

## 2022-10-09 DIAGNOSIS — G309 Alzheimer's disease, unspecified: Secondary | ICD-10-CM | POA: Diagnosis not present

## 2022-10-09 DIAGNOSIS — G894 Chronic pain syndrome: Secondary | ICD-10-CM | POA: Diagnosis not present

## 2022-10-09 DIAGNOSIS — G4700 Insomnia, unspecified: Secondary | ICD-10-CM | POA: Diagnosis not present

## 2022-10-09 DIAGNOSIS — M810 Age-related osteoporosis without current pathological fracture: Secondary | ICD-10-CM | POA: Diagnosis not present

## 2022-10-09 DIAGNOSIS — K219 Gastro-esophageal reflux disease without esophagitis: Secondary | ICD-10-CM | POA: Diagnosis not present

## 2022-10-16 DIAGNOSIS — M810 Age-related osteoporosis without current pathological fracture: Secondary | ICD-10-CM | POA: Diagnosis not present

## 2022-10-16 DIAGNOSIS — G4700 Insomnia, unspecified: Secondary | ICD-10-CM | POA: Diagnosis not present

## 2022-10-16 DIAGNOSIS — D519 Vitamin B12 deficiency anemia, unspecified: Secondary | ICD-10-CM | POA: Diagnosis not present

## 2022-10-16 DIAGNOSIS — G894 Chronic pain syndrome: Secondary | ICD-10-CM | POA: Diagnosis not present

## 2022-10-16 DIAGNOSIS — K219 Gastro-esophageal reflux disease without esophagitis: Secondary | ICD-10-CM | POA: Diagnosis not present

## 2022-10-16 DIAGNOSIS — G309 Alzheimer's disease, unspecified: Secondary | ICD-10-CM | POA: Diagnosis not present

## 2022-10-31 DIAGNOSIS — R451 Restlessness and agitation: Secondary | ICD-10-CM | POA: Diagnosis not present

## 2022-10-31 DIAGNOSIS — G894 Chronic pain syndrome: Secondary | ICD-10-CM | POA: Diagnosis not present

## 2022-10-31 DIAGNOSIS — F5105 Insomnia due to other mental disorder: Secondary | ICD-10-CM | POA: Diagnosis not present

## 2022-10-31 DIAGNOSIS — G309 Alzheimer's disease, unspecified: Secondary | ICD-10-CM | POA: Diagnosis not present

## 2022-11-06 DIAGNOSIS — D519 Vitamin B12 deficiency anemia, unspecified: Secondary | ICD-10-CM | POA: Diagnosis not present

## 2022-11-06 DIAGNOSIS — M810 Age-related osteoporosis without current pathological fracture: Secondary | ICD-10-CM | POA: Diagnosis not present

## 2022-11-13 DIAGNOSIS — M810 Age-related osteoporosis without current pathological fracture: Secondary | ICD-10-CM | POA: Diagnosis not present

## 2022-11-13 DIAGNOSIS — K219 Gastro-esophageal reflux disease without esophagitis: Secondary | ICD-10-CM | POA: Diagnosis not present

## 2022-11-13 DIAGNOSIS — G309 Alzheimer's disease, unspecified: Secondary | ICD-10-CM | POA: Diagnosis not present

## 2022-11-13 DIAGNOSIS — G894 Chronic pain syndrome: Secondary | ICD-10-CM | POA: Diagnosis not present

## 2022-11-13 DIAGNOSIS — D519 Vitamin B12 deficiency anemia, unspecified: Secondary | ICD-10-CM | POA: Diagnosis not present

## 2022-11-13 DIAGNOSIS — F5105 Insomnia due to other mental disorder: Secondary | ICD-10-CM | POA: Diagnosis not present

## 2022-11-13 DIAGNOSIS — R451 Restlessness and agitation: Secondary | ICD-10-CM | POA: Diagnosis not present

## 2022-11-16 DIAGNOSIS — R2681 Unsteadiness on feet: Secondary | ICD-10-CM | POA: Diagnosis not present

## 2022-11-16 DIAGNOSIS — M1991 Primary osteoarthritis, unspecified site: Secondary | ICD-10-CM | POA: Diagnosis not present

## 2022-11-22 DIAGNOSIS — R2681 Unsteadiness on feet: Secondary | ICD-10-CM | POA: Diagnosis not present

## 2022-11-22 DIAGNOSIS — M1991 Primary osteoarthritis, unspecified site: Secondary | ICD-10-CM | POA: Diagnosis not present

## 2022-11-25 DIAGNOSIS — R2681 Unsteadiness on feet: Secondary | ICD-10-CM | POA: Diagnosis not present

## 2022-11-25 DIAGNOSIS — M1991 Primary osteoarthritis, unspecified site: Secondary | ICD-10-CM | POA: Diagnosis not present

## 2022-12-05 DIAGNOSIS — M810 Age-related osteoporosis without current pathological fracture: Secondary | ICD-10-CM | POA: Diagnosis not present

## 2022-12-05 DIAGNOSIS — D519 Vitamin B12 deficiency anemia, unspecified: Secondary | ICD-10-CM | POA: Diagnosis not present

## 2022-12-18 DIAGNOSIS — R2681 Unsteadiness on feet: Secondary | ICD-10-CM | POA: Diagnosis not present

## 2022-12-18 DIAGNOSIS — M1991 Primary osteoarthritis, unspecified site: Secondary | ICD-10-CM | POA: Diagnosis not present

## 2022-12-25 DIAGNOSIS — G309 Alzheimer's disease, unspecified: Secondary | ICD-10-CM | POA: Diagnosis not present

## 2022-12-25 DIAGNOSIS — K219 Gastro-esophageal reflux disease without esophagitis: Secondary | ICD-10-CM | POA: Diagnosis not present

## 2022-12-25 DIAGNOSIS — M810 Age-related osteoporosis without current pathological fracture: Secondary | ICD-10-CM | POA: Diagnosis not present

## 2022-12-25 DIAGNOSIS — R451 Restlessness and agitation: Secondary | ICD-10-CM | POA: Diagnosis not present

## 2022-12-25 DIAGNOSIS — R2681 Unsteadiness on feet: Secondary | ICD-10-CM | POA: Diagnosis not present

## 2022-12-25 DIAGNOSIS — F5105 Insomnia due to other mental disorder: Secondary | ICD-10-CM | POA: Diagnosis not present

## 2022-12-25 DIAGNOSIS — M1991 Primary osteoarthritis, unspecified site: Secondary | ICD-10-CM | POA: Diagnosis not present

## 2022-12-25 DIAGNOSIS — G894 Chronic pain syndrome: Secondary | ICD-10-CM | POA: Diagnosis not present

## 2022-12-25 DIAGNOSIS — D519 Vitamin B12 deficiency anemia, unspecified: Secondary | ICD-10-CM | POA: Diagnosis not present

## 2022-12-25 DIAGNOSIS — I959 Hypotension, unspecified: Secondary | ICD-10-CM | POA: Diagnosis not present

## 2022-12-31 DIAGNOSIS — R2681 Unsteadiness on feet: Secondary | ICD-10-CM | POA: Diagnosis not present

## 2022-12-31 DIAGNOSIS — M1991 Primary osteoarthritis, unspecified site: Secondary | ICD-10-CM | POA: Diagnosis not present

## 2022-12-31 DIAGNOSIS — M810 Age-related osteoporosis without current pathological fracture: Secondary | ICD-10-CM | POA: Diagnosis not present

## 2022-12-31 DIAGNOSIS — D519 Vitamin B12 deficiency anemia, unspecified: Secondary | ICD-10-CM | POA: Diagnosis not present

## 2023-01-02 DIAGNOSIS — R2681 Unsteadiness on feet: Secondary | ICD-10-CM | POA: Diagnosis not present

## 2023-01-02 DIAGNOSIS — M1991 Primary osteoarthritis, unspecified site: Secondary | ICD-10-CM | POA: Diagnosis not present

## 2023-01-15 DIAGNOSIS — R451 Restlessness and agitation: Secondary | ICD-10-CM | POA: Diagnosis not present

## 2023-01-15 DIAGNOSIS — G894 Chronic pain syndrome: Secondary | ICD-10-CM | POA: Diagnosis not present

## 2023-01-15 DIAGNOSIS — D519 Vitamin B12 deficiency anemia, unspecified: Secondary | ICD-10-CM | POA: Diagnosis not present

## 2023-01-15 DIAGNOSIS — G309 Alzheimer's disease, unspecified: Secondary | ICD-10-CM | POA: Diagnosis not present

## 2023-01-15 DIAGNOSIS — M810 Age-related osteoporosis without current pathological fracture: Secondary | ICD-10-CM | POA: Diagnosis not present

## 2023-01-15 DIAGNOSIS — F5105 Insomnia due to other mental disorder: Secondary | ICD-10-CM | POA: Diagnosis not present

## 2023-01-15 DIAGNOSIS — I959 Hypotension, unspecified: Secondary | ICD-10-CM | POA: Diagnosis not present

## 2023-01-15 DIAGNOSIS — K219 Gastro-esophageal reflux disease without esophagitis: Secondary | ICD-10-CM | POA: Diagnosis not present

## 2023-01-21 DIAGNOSIS — M1991 Primary osteoarthritis, unspecified site: Secondary | ICD-10-CM | POA: Diagnosis not present

## 2023-01-21 DIAGNOSIS — R2681 Unsteadiness on feet: Secondary | ICD-10-CM | POA: Diagnosis not present

## 2023-01-22 ENCOUNTER — Telehealth: Payer: Self-pay | Admitting: Neurology

## 2023-01-22 ENCOUNTER — Telehealth: Payer: Self-pay | Admitting: Physician Assistant

## 2023-01-22 NOTE — Telephone Encounter (Signed)
Caller requesting to speak with [facility]. Advised that he would receive call back from assistant

## 2023-01-23 DIAGNOSIS — R451 Restlessness and agitation: Secondary | ICD-10-CM | POA: Diagnosis not present

## 2023-01-23 DIAGNOSIS — K219 Gastro-esophageal reflux disease without esophagitis: Secondary | ICD-10-CM | POA: Diagnosis not present

## 2023-01-23 DIAGNOSIS — M810 Age-related osteoporosis without current pathological fracture: Secondary | ICD-10-CM | POA: Diagnosis not present

## 2023-01-23 DIAGNOSIS — D519 Vitamin B12 deficiency anemia, unspecified: Secondary | ICD-10-CM | POA: Diagnosis not present

## 2023-01-23 DIAGNOSIS — I959 Hypotension, unspecified: Secondary | ICD-10-CM | POA: Diagnosis not present

## 2023-01-24 ENCOUNTER — Ambulatory Visit: Payer: Medicare PPO | Admitting: Physician Assistant

## 2023-01-24 DIAGNOSIS — D519 Vitamin B12 deficiency anemia, unspecified: Secondary | ICD-10-CM | POA: Diagnosis not present

## 2023-01-24 DIAGNOSIS — G894 Chronic pain syndrome: Secondary | ICD-10-CM | POA: Diagnosis not present

## 2023-01-24 DIAGNOSIS — K219 Gastro-esophageal reflux disease without esophagitis: Secondary | ICD-10-CM | POA: Diagnosis not present

## 2023-01-24 DIAGNOSIS — M810 Age-related osteoporosis without current pathological fracture: Secondary | ICD-10-CM | POA: Diagnosis not present

## 2023-01-24 DIAGNOSIS — G309 Alzheimer's disease, unspecified: Secondary | ICD-10-CM | POA: Diagnosis not present

## 2023-01-25 DIAGNOSIS — M810 Age-related osteoporosis without current pathological fracture: Secondary | ICD-10-CM | POA: Diagnosis not present

## 2023-01-25 DIAGNOSIS — D519 Vitamin B12 deficiency anemia, unspecified: Secondary | ICD-10-CM | POA: Diagnosis not present

## 2023-02-20 DIAGNOSIS — I959 Hypotension, unspecified: Secondary | ICD-10-CM | POA: Diagnosis not present

## 2023-02-20 DIAGNOSIS — E785 Hyperlipidemia, unspecified: Secondary | ICD-10-CM | POA: Diagnosis not present

## 2023-02-20 DIAGNOSIS — D519 Vitamin B12 deficiency anemia, unspecified: Secondary | ICD-10-CM | POA: Diagnosis not present

## 2023-02-20 DIAGNOSIS — G309 Alzheimer's disease, unspecified: Secondary | ICD-10-CM | POA: Diagnosis not present

## 2023-02-20 DIAGNOSIS — K219 Gastro-esophageal reflux disease without esophagitis: Secondary | ICD-10-CM | POA: Diagnosis not present

## 2023-02-20 DIAGNOSIS — I129 Hypertensive chronic kidney disease with stage 1 through stage 4 chronic kidney disease, or unspecified chronic kidney disease: Secondary | ICD-10-CM | POA: Diagnosis not present

## 2023-02-20 DIAGNOSIS — F5105 Insomnia due to other mental disorder: Secondary | ICD-10-CM | POA: Diagnosis not present

## 2023-02-20 DIAGNOSIS — G894 Chronic pain syndrome: Secondary | ICD-10-CM | POA: Diagnosis not present

## 2023-02-20 DIAGNOSIS — M810 Age-related osteoporosis without current pathological fracture: Secondary | ICD-10-CM | POA: Diagnosis not present

## 2023-03-12 DIAGNOSIS — M810 Age-related osteoporosis without current pathological fracture: Secondary | ICD-10-CM | POA: Diagnosis not present

## 2023-03-12 DIAGNOSIS — G894 Chronic pain syndrome: Secondary | ICD-10-CM | POA: Diagnosis not present

## 2023-03-19 DIAGNOSIS — M810 Age-related osteoporosis without current pathological fracture: Secondary | ICD-10-CM | POA: Diagnosis not present

## 2023-03-19 DIAGNOSIS — I959 Hypotension, unspecified: Secondary | ICD-10-CM | POA: Diagnosis not present

## 2023-03-19 DIAGNOSIS — K219 Gastro-esophageal reflux disease without esophagitis: Secondary | ICD-10-CM | POA: Diagnosis not present

## 2023-03-19 DIAGNOSIS — D519 Vitamin B12 deficiency anemia, unspecified: Secondary | ICD-10-CM | POA: Diagnosis not present

## 2023-03-19 DIAGNOSIS — N182 Chronic kidney disease, stage 2 (mild): Secondary | ICD-10-CM | POA: Diagnosis not present

## 2023-03-19 DIAGNOSIS — E785 Hyperlipidemia, unspecified: Secondary | ICD-10-CM | POA: Diagnosis not present

## 2023-03-19 DIAGNOSIS — F5105 Insomnia due to other mental disorder: Secondary | ICD-10-CM | POA: Diagnosis not present

## 2023-04-02 DIAGNOSIS — G894 Chronic pain syndrome: Secondary | ICD-10-CM | POA: Diagnosis not present

## 2023-04-02 DIAGNOSIS — G9009 Other idiopathic peripheral autonomic neuropathy: Secondary | ICD-10-CM | POA: Diagnosis not present

## 2023-04-02 DIAGNOSIS — M545 Low back pain, unspecified: Secondary | ICD-10-CM | POA: Diagnosis not present

## 2023-04-03 DIAGNOSIS — E785 Hyperlipidemia, unspecified: Secondary | ICD-10-CM | POA: Diagnosis not present

## 2023-04-03 DIAGNOSIS — N189 Chronic kidney disease, unspecified: Secondary | ICD-10-CM | POA: Diagnosis not present

## 2023-04-06 DIAGNOSIS — M48061 Spinal stenosis, lumbar region without neurogenic claudication: Secondary | ICD-10-CM | POA: Diagnosis not present

## 2023-04-06 DIAGNOSIS — M4606 Spinal enthesopathy, lumbar region: Secondary | ICD-10-CM | POA: Diagnosis not present

## 2023-04-06 DIAGNOSIS — M47816 Spondylosis without myelopathy or radiculopathy, lumbar region: Secondary | ICD-10-CM | POA: Diagnosis not present

## 2023-04-06 DIAGNOSIS — M160 Bilateral primary osteoarthritis of hip: Secondary | ICD-10-CM | POA: Diagnosis not present

## 2023-04-06 DIAGNOSIS — M8589 Other specified disorders of bone density and structure, multiple sites: Secondary | ICD-10-CM | POA: Diagnosis not present

## 2023-04-16 DIAGNOSIS — G894 Chronic pain syndrome: Secondary | ICD-10-CM | POA: Diagnosis not present

## 2023-04-16 DIAGNOSIS — M545 Low back pain, unspecified: Secondary | ICD-10-CM | POA: Diagnosis not present

## 2023-04-16 DIAGNOSIS — G9009 Other idiopathic peripheral autonomic neuropathy: Secondary | ICD-10-CM | POA: Diagnosis not present

## 2023-04-18 DIAGNOSIS — M47816 Spondylosis without myelopathy or radiculopathy, lumbar region: Secondary | ICD-10-CM | POA: Diagnosis not present

## 2023-04-18 DIAGNOSIS — M160 Bilateral primary osteoarthritis of hip: Secondary | ICD-10-CM | POA: Diagnosis not present

## 2023-04-18 DIAGNOSIS — M8589 Other specified disorders of bone density and structure, multiple sites: Secondary | ICD-10-CM | POA: Diagnosis not present

## 2023-04-18 DIAGNOSIS — M48061 Spinal stenosis, lumbar region without neurogenic claudication: Secondary | ICD-10-CM | POA: Diagnosis not present

## 2023-04-18 DIAGNOSIS — M4606 Spinal enthesopathy, lumbar region: Secondary | ICD-10-CM | POA: Diagnosis not present

## 2023-04-18 DIAGNOSIS — R4182 Altered mental status, unspecified: Secondary | ICD-10-CM | POA: Diagnosis not present

## 2023-04-23 DIAGNOSIS — G9009 Other idiopathic peripheral autonomic neuropathy: Secondary | ICD-10-CM | POA: Diagnosis not present

## 2023-04-23 DIAGNOSIS — N390 Urinary tract infection, site not specified: Secondary | ICD-10-CM | POA: Diagnosis not present

## 2023-04-23 DIAGNOSIS — G894 Chronic pain syndrome: Secondary | ICD-10-CM | POA: Diagnosis not present

## 2023-04-23 DIAGNOSIS — M545 Low back pain, unspecified: Secondary | ICD-10-CM | POA: Diagnosis not present

## 2023-04-27 ENCOUNTER — Emergency Department (HOSPITAL_COMMUNITY)

## 2023-04-27 ENCOUNTER — Other Ambulatory Visit: Payer: Self-pay

## 2023-04-27 ENCOUNTER — Emergency Department (HOSPITAL_COMMUNITY)
Admission: EM | Admit: 2023-04-27 | Discharge: 2023-04-27 | Disposition: A | Discharge status: Home / Self Care | Attending: Emergency Medicine | Admitting: Emergency Medicine

## 2023-04-27 ENCOUNTER — Encounter (HOSPITAL_COMMUNITY): Payer: Self-pay

## 2023-04-27 DIAGNOSIS — I6782 Cerebral ischemia: Secondary | ICD-10-CM | POA: Diagnosis not present

## 2023-04-27 DIAGNOSIS — W19XXXA Unspecified fall, initial encounter: Secondary | ICD-10-CM | POA: Diagnosis not present

## 2023-04-27 DIAGNOSIS — N390 Urinary tract infection, site not specified: Secondary | ICD-10-CM | POA: Diagnosis not present

## 2023-04-27 DIAGNOSIS — R531 Weakness: Secondary | ICD-10-CM | POA: Diagnosis not present

## 2023-04-27 DIAGNOSIS — R0902 Hypoxemia: Secondary | ICD-10-CM | POA: Diagnosis not present

## 2023-04-27 DIAGNOSIS — D72829 Elevated white blood cell count, unspecified: Secondary | ICD-10-CM | POA: Insufficient documentation

## 2023-04-27 DIAGNOSIS — G309 Alzheimer's disease, unspecified: Secondary | ICD-10-CM | POA: Diagnosis not present

## 2023-04-27 DIAGNOSIS — R000 Tachycardia, unspecified: Secondary | ICD-10-CM | POA: Diagnosis not present

## 2023-04-27 DIAGNOSIS — R0689 Other abnormalities of breathing: Secondary | ICD-10-CM | POA: Diagnosis not present

## 2023-04-27 DIAGNOSIS — R4182 Altered mental status, unspecified: Secondary | ICD-10-CM | POA: Diagnosis not present

## 2023-04-27 DIAGNOSIS — F0390 Unspecified dementia without behavioral disturbance: Secondary | ICD-10-CM | POA: Insufficient documentation

## 2023-04-27 DIAGNOSIS — M25552 Pain in left hip: Secondary | ICD-10-CM | POA: Diagnosis not present

## 2023-04-27 LAB — URINALYSIS, W/ REFLEX TO CULTURE (INFECTION SUSPECTED)
Bilirubin Urine: NEGATIVE
Glucose, UA: NEGATIVE mg/dL
Ketones, ur: NEGATIVE mg/dL
Nitrite: NEGATIVE
Protein, ur: 30 mg/dL — AB
Specific Gravity, Urine: 1.02 (ref 1.005–1.030)
WBC, UA: >50 WBC/hpf (ref 0–5)
pH: 6 (ref 5.0–8.0)

## 2023-04-27 LAB — CBC WITH DIFFERENTIAL/PLATELET
Abs Immature Granulocytes: 0.12 10*3/uL — ABNORMAL HIGH (ref 0.00–0.07)
Basophils Absolute: 0.1 10*3/uL (ref 0.0–0.1)
Basophils Relative: 0 %
Eosinophils Absolute: 0.3 10*3/uL (ref 0.0–0.5)
Eosinophils Relative: 1 %
HCT: 33.4 % — ABNORMAL LOW (ref 36.0–46.0)
Hemoglobin: 10.8 g/dL — ABNORMAL LOW (ref 12.0–15.0)
Immature Granulocytes: 1 %
Lymphocytes Relative: 7 %
Lymphs Abs: 1.4 10*3/uL (ref 0.7–4.0)
MCH: 29.4 pg (ref 26.0–34.0)
MCHC: 32.3 g/dL (ref 30.0–36.0)
MCV: 91 fL (ref 80.0–100.0)
Monocytes Absolute: 1.2 10*3/uL — ABNORMAL HIGH (ref 0.1–1.0)
Monocytes Relative: 6 %
Neutro Abs: 15.7 10*3/uL — ABNORMAL HIGH (ref 1.7–7.7)
Neutrophils Relative %: 85 %
Platelets: 275 10*3/uL (ref 150–400)
RBC: 3.67 MIL/uL — ABNORMAL LOW (ref 3.87–5.11)
RDW: 14.8 % (ref 11.5–15.5)
WBC: 18.7 10*3/uL — ABNORMAL HIGH (ref 4.0–10.5)
nRBC: 0 % (ref 0.0–0.2)

## 2023-04-27 LAB — COMPREHENSIVE METABOLIC PANEL
ALT: 10 U/L (ref 0–44)
AST: 16 U/L (ref 15–41)
Albumin: 2.7 g/dL — ABNORMAL LOW (ref 3.5–5.0)
Alkaline Phosphatase: 54 U/L (ref 38–126)
Anion gap: 9 (ref 5–15)
BUN: 18 mg/dL (ref 8–23)
CO2: 26 mmol/L (ref 22–32)
Calcium: 8.5 mg/dL — ABNORMAL LOW (ref 8.9–10.3)
Chloride: 100 mmol/L (ref 98–111)
Creatinine, Ser: 0.77 mg/dL (ref 0.44–1.00)
GFR, Estimated: >60 mL/min (ref >60)
Glucose, Bld: 160 mg/dL — ABNORMAL HIGH (ref 70–99)
Potassium: 4.2 mmol/L (ref 3.5–5.1)
Sodium: 135 mmol/L (ref 135–145)
Total Bilirubin: 0.5 mg/dL (ref 0.0–1.2)
Total Protein: 6 g/dL — ABNORMAL LOW (ref 6.5–8.1)

## 2023-04-27 LAB — I-STAT CG4 LACTIC ACID, ED: Lactic Acid, Venous: <0.3 mmol/L — ABNORMAL LOW (ref 0.5–1.9)

## 2023-04-27 MED ORDER — SODIUM CHLORIDE 0.9 % IV BOLUS
1000.0000 mL | Freq: Once | INTRAVENOUS | Status: AC
  Administered 2023-04-27: 1000 mL via INTRAVENOUS

## 2023-04-27 MED ORDER — CEPHALEXIN 250 MG PO CAPS
500.0000 mg | ORAL_CAPSULE | Freq: Once | ORAL | Status: AC
  Administered 2023-04-27: 500 mg via ORAL
  Filled 2023-04-27: qty 2

## 2023-04-27 MED ORDER — CEPHALEXIN 500 MG PO CAPS
500.0000 mg | ORAL_CAPSULE | Freq: Two times a day (BID) | ORAL | 0 refills | Status: AC
Start: 2023-04-27 — End: 2023-05-02

## 2023-04-27 MED ORDER — HYDROCODONE-ACETAMINOPHEN 5-325 MG PO TABS
1.0000 | ORAL_TABLET | Freq: Once | ORAL | Status: AC
  Administered 2023-04-27: 1 via ORAL
  Filled 2023-04-27: qty 1

## 2023-04-27 MED ORDER — METHOCARBAMOL 500 MG PO TABS
750.0000 mg | ORAL_TABLET | Freq: Once | ORAL | Status: AC
  Administered 2023-04-27: 750 mg via ORAL
  Filled 2023-04-27: qty 2

## 2023-04-27 MED ORDER — CEPHALEXIN 500 MG PO CAPS: 500.0000 mg | ORAL_CAPSULE | Freq: Two times a day (BID) | ORAL | 0 refills | Status: DC

## 2023-04-27 NOTE — ED Provider Notes (Signed)
  Physical Exam  BP 132/80   Pulse 99   Temp 98.6 F (37 C) (Oral)   Resp 19   SpO2 93%   Physical Exam  Procedures  Procedures  ED Course / MDM   Clinical Course as of 04/27/23 1855  Sun Apr 27, 2023  1526 S - 1 day AMS, from memory care, diagnosis UTI with macrobid in past (no cx data), family feel at baseline, documented hypotension - in/out for urine [ ]  f/u for urine [HG]    Clinical Course User Index [HG] Renella Cunas, MD   Medical Decision Making Amount and/or Complexity of Data Reviewed Labs: ordered. Radiology: ordered.  Risk Prescription drug management.   At the time of handoff, I was awaiting urinalysis.  Please see prior ED providers note for HPI and physical exam.  Urinalysis resulted with concern for UTI.  Patient nontender in CVA area, thus low suspicion for pyelonephritis.  Patient was on Macrobid, however we have no prior culture data.  Will give patient a dose of Keflex here and send her back to facility on a 5-day twice daily course.  Her mental status is at baseline per family at bedside and vital signs have remained stable.  While in the ED, patient received home doses of Robaxin and Norco for chronic pain.  Renella Cunas, PGY 2 Emergency medicine   Renella Cunas, MD 04/27/23 Herbie [place]    Pricilla Loveless, MD 04/27/23 [phone]

## 2023-04-27 NOTE — Discharge Instructions (Addendum)
 You presented to the ED with altered mental status with a known UTI.  Your lab work was reassuring.  You do have a UTI.  We are going to change the antibiotic which she will take twice daily for total of 5 days (keflex 500mg ).  This antibiotic has been sent to the preferred pharmacy.  Please take the full course of this antibiotic even if you start feeling better.  Please stop taking the prior prescribe antibiotic (macrobid). We hope you feel better soon.

## 2023-04-27 NOTE — ED Triage Notes (Signed)
 Pt BIB [facility] from [facility] with weakness. Per EMS facility states she has been less like herself since yesterday. Pt has hx of dementia and is ao to self only at baseline. Pt has diagnosed UTI and has been taking abx. Per EMS pt felt febrile and is tachycardic around 110 HR and 98/56 BP.

## 2023-04-27 NOTE — ED Provider Notes (Signed)
 [place] EMERGENCY DEPARTMENT AT [facility] Provider Note   CSN: [id number] Arrival date & time: 04/27/23  1044     History  Chief Complaint  Patient presents with   Weakness    Kristine Stafford is a 73 y.o. female with pmhx of thyroid disease, osteopenia, PVC, chronic pain, dementia presenting to ER from memory care facility for generalized weakness and AMS for one day.  Was recently diagnosed with urinary tract infection.  She has been taking an antibiotic.  According to EMS she was slightly tachycardic and had low blood pressure.  She was given 500 cc of fluid.  Her temperature is normal however they report that she felt very warm.  She had no recent injury trauma or fall.  She is acting at her baseline now. Family in room confirms this. She is normally able to follow basic commands and is alert to person and place. She has no focal weakness.  She does have history of Alzheimer's disease and has been staying with memory care.   Weakness      Home Medications Prior to Admission medications   Medication Sig Start Date End Date Taking? Authorizing Provider  acetaminophen (TYLENOL) 500 MG tablet Take 1,000 mg by mouth every 6 (six) hours as needed (for pain.).    [provider]  alendronate (FOSAMAX) 70 MG tablet Take 70 mg by mouth every Tuesday. Take with a full glass of water on an empty stomach.    [provider]  atorvastatin (LIPITOR) 20 MG tablet Take 20 mg by mouth in the morning. 11/01/20   [provider]  Calcium Carbonate (CALCIUM 600 PO) Take 600 mg by mouth in the morning.    [provider]  Cholecalciferol (VITAMIN D3) 125 MCG (5000 UT) TABS Take 5,000 Units by mouth in the morning.    [provider]  donepezil (ARICEPT) 10 MG tablet Take 1 tablet (10 mg total) by mouth at bedtime. 07/24/22   Marcos Eke, PA-C  gabapentin (NEURONTIN) 100 MG capsule Take 100-200 mg by mouth 2 (two) times daily as needed for  pain. 08/01/21   [provider]  memantine (NAMENDA) 10 MG tablet Take 1 tablet   twice a day 07/24/22   Marcos Eke, PA-C  Menthol, Topical Analgesic, (BIOFREEZE EX) Apply 1 Application topically 3 (three) times daily as needed (back pain.).    [provider]  methocarbamol (ROBAXIN) 750 MG tablet Take 1 tablet (750 mg total) by mouth every 6 (six) hours as needed for muscle spasms. 09/12/21   McKenzie, Eilene Ghazi, PA-C  Polyethyl Glycol-Propyl Glycol (LUBRICANT EYE DROPS) 0.4-0.3 % SOLN Place 1-2 drops into both eyes 3 (three) times daily as needed (dry/irritated eyes.).    [provider]  traMADol (ULTRAM) 50 MG tablet Take 50 mg by mouth 3 (three) times daily as needed (pain.). 12/28/20   [provider]  vitamin B-12 (CYANOCOBALAMIN) 1000 MCG tablet Take 1,000 mcg by mouth in the morning.    [provider]      Allergies    Egg-derived products, Other, Penicillins, and Tetracyclines & related    Review of Systems   Review of Systems  Neurological:  Positive for weakness.    Physical Exam Updated Vital Signs BP 95/60   Pulse 89   Temp 98.6 F (37 C) (Oral)   Resp (!) 22   SpO2 94%  Physical Exam Vitals and nursing note reviewed.  Constitutional:  General: She is not in acute distress.    Appearance: She is not toxic-appearing.  HENT:     Head: Normocephalic and atraumatic.  Eyes:     General: No scleral icterus.    Conjunctiva/sclera: Conjunctivae normal.  Cardiovascular:     Rate and Rhythm: Normal rate and regular rhythm.     Pulses: Normal pulses.     Heart sounds: Normal heart sounds.  Pulmonary:     Effort: Pulmonary effort is normal. No respiratory distress.     Breath sounds: Normal breath sounds.  Abdominal:     General: Abdomen is flat. Bowel sounds are normal.     Palpations: Abdomen is soft.     Tenderness: There is no abdominal tenderness.     Comments: No abdominal distention.  No focal area of  tenderness.  Musculoskeletal:     Right lower leg: No edema.     Left lower leg: No edema.  Skin:    General: Skin is warm and dry.     Findings: No lesion.  Neurological:     General: No focal deficit present.     Mental Status: She is alert. Mental status is at baseline.     Comments: Patient is alert to place and person.  She is able to follow basic commands.  She has no cranial nerve deficits.  Pupils equal and reactive. Sensation and strength of upper extremity and lower extremity intact.     ED Results / Procedures / Treatments   Labs (all labs ordered are listed, but only abnormal results are displayed) Labs Reviewed  CBC WITH DIFFERENTIAL/PLATELET - Abnormal; Notable for the following components:      Result Value   WBC 18.7 (*)    RBC 3.67 (*)    Hemoglobin 10.8 (*)    HCT 33.4 (*)    Neutro Abs 15.7 (*)    Monocytes Absolute 1.2 (*)    Abs Immature Granulocytes 0.12 (*)    All other components within normal limits  COMPREHENSIVE METABOLIC PANEL - Abnormal; Notable for the following components:   Glucose, Bld 160 (*)    Calcium 8.5 (*)    Total Protein 6.0 (*)    Albumin 2.7 (*)    All other components within normal limits  I-STAT CG4 LACTIC ACID, ED - Abnormal; Notable for the following components:   Lactic Acid, Venous <0.3 (*)    All other components within normal limits  CULTURE, BLOOD (ROUTINE X 2)  CULTURE, BLOOD (ROUTINE X 2) W REFLEX TO ID PANEL  URINALYSIS, W/ REFLEX TO CULTURE (INFECTION SUSPECTED)  I-STAT CG4 LACTIC ACID, ED    EKG EKG Interpretation Date/Time:  Sunday April 27 2023 10:50:46 EDT Ventricular Rate:  102 PR Interval:  137 QRS Duration:  88 QT Interval:  371 QTC Calculation: 484 R Axis:   20  Text Interpretation: Sinus tachycardia LVH by voltage Nonspecific T abnormalities, lateral leads Confirmed by Vonita Moss [phone]) on 04/27/2023 12:05:28 PM  Radiology DG Chest Portable 1 View Result Date: 04/27/2023 CLINICAL DATA:   Altered mental status EXAM: PORTABLE CHEST 1 VIEW COMPARISON:  None Available. FINDINGS: The heart size and mediastinal contours are within normal limits. No focal airspace consolidation, pleural effusion, or pneumothorax. The visualized skeletal structures are unremarkable. IMPRESSION: No active disease. Electronically Signed   By: Duanne Guess D.O.   On: 04/27/2023 12:03    Procedures Procedures    Medications Ordered in ED Medications  sodium chloride 0.9 % bolus 1,000 mL (1,000  mLs Intravenous New Bag/Given 04/27/23 1124)    ED Course/ Medical Decision Making/ A&P Clinical Course as of 04/27/23 1940  Sun Apr 27, 2023  1526 S - 1 day AMS, from memory care, diagnosis UTI with macrobid in past (no cx data), family feel at baseline, documented hypotension - in/out for urine [ ]  f/u for urine [HG]    Clinical Course User Index [HG] Renella Cunas, MD                                 Medical Decision Making Amount and/or Complexity of Data Reviewed Labs: ordered. Radiology: ordered.  Risk Prescription drug management.   Lynnie Koehler 73 y.o. presented today for generalized weakness. Working DDx that I considered at this time includes, but not limited to, CVA/TIA, arrhythmia, vertigo, medication s/e, orthostatic hypotension, electrolyte abnormalities, dehydration, URI, ACS, UTI, anemia   R/o DDx: These are considered less likely than current impression due to history of present illness, physical exam, lab/imaging findings   Pmhx: thyroid disease, osteopenia, PVC, chronic pain, dementia   Unique Tests and My Interpretation:  CBC with differential: Leukocytosis of 18.7 hemoglobin is 10.8 CMP: No AKI, normal renal function.  No elevation in liver enzymes.  No electrolyte abnormality Lactic WNL Blood culture: Pending  UA: pending.  EKG: Rate, rhythm, axis, intervals all examined: PVCs  Imaging:  CXR negative CT of head negative    Problem List / ED Course /  Critical interventions / Medication management  Reporting to emergency room with generalized weakness for 1 day.  According to facility she is not then as alert as normal.  She has been able to ambulate at her baseline.  Walks with a cane with very short distances.  She is alert to person.  Husband at bedside confirms that she is at baseline.  She was recently diagnosed with urinary tract infection and started on Macrobid.  She does have leukocytosis.  Otherwise lab work reassuring.  Will send blood cultures.  Awaiting urinary analysis.  CT of the head is unremarkable without any acute findings.  Chest x-ray is negative without any acute findings.  She was initially tachycardic.  This improved after saline.  She does have history of hypotension and soft blood pressures.  Appears her baseline is around 80s to 90s systolic.  This has been consistent with what we are seeing today.  She is not hypoxic.  No increased work of breathing lungs clear to auscultation bilaterally.  She has no focal area of abdominal tenderness.  She is able to move extremities and she has nonfocal neuroexam.  Tried contacting facility but was unsuccessful in speaking with someone who commonly interacts with her.  I ordered medication including NS 2L Reevaluation of the patient after these medicines showed that the patient improved Patients vitals assessed. Upon arrival patient is hemodynamically stable.  I have reviewed the patients home medicines and have made adjustments as needed   Plan: Dispo pending UA -- feel if she is at baseline trial of Keflex outpatient if she has UTI  Signed off to resident Dr Clemon Chambers        Final Clinical Impression(s) / ED Diagnoses Final diagnoses:  None    Rx / DC Orders ED Discharge Orders     None         Reinaldo Raddle 04/27/23 1942    Rondel Baton, MD 05/02/23 [phone]

## 2023-05-02 LAB — CULTURE, BLOOD (ROUTINE X 2)
Culture: NO GROWTH
Culture: NO GROWTH
Special Requests: ADEQUATE
Special Requests: ADEQUATE

## 2023-05-09 DIAGNOSIS — G309 Alzheimer's disease, unspecified: Secondary | ICD-10-CM | POA: Diagnosis not present

## 2023-05-09 DIAGNOSIS — N390 Urinary tract infection, site not specified: Secondary | ICD-10-CM | POA: Diagnosis not present

## 2023-05-09 DIAGNOSIS — R197 Diarrhea, unspecified: Secondary | ICD-10-CM | POA: Diagnosis not present

## 2023-05-09 DIAGNOSIS — G9009 Other idiopathic peripheral autonomic neuropathy: Secondary | ICD-10-CM | POA: Diagnosis not present

## 2023-05-09 DIAGNOSIS — N189 Chronic kidney disease, unspecified: Secondary | ICD-10-CM | POA: Diagnosis not present

## 2023-05-16 DIAGNOSIS — G309 Alzheimer's disease, unspecified: Secondary | ICD-10-CM | POA: Diagnosis not present

## 2023-05-16 DIAGNOSIS — K219 Gastro-esophageal reflux disease without esophagitis: Secondary | ICD-10-CM | POA: Diagnosis not present

## 2023-05-16 DIAGNOSIS — I959 Hypotension, unspecified: Secondary | ICD-10-CM | POA: Diagnosis not present

## 2023-05-23 DIAGNOSIS — G894 Chronic pain syndrome: Secondary | ICD-10-CM | POA: Diagnosis not present

## 2023-05-23 DIAGNOSIS — G9009 Other idiopathic peripheral autonomic neuropathy: Secondary | ICD-10-CM | POA: Diagnosis not present

## 2023-05-23 DIAGNOSIS — M545 Low back pain, unspecified: Secondary | ICD-10-CM | POA: Diagnosis not present

## 2023-06-06 DIAGNOSIS — N189 Chronic kidney disease, unspecified: Secondary | ICD-10-CM | POA: Diagnosis not present

## 2023-06-06 DIAGNOSIS — G9009 Other idiopathic peripheral autonomic neuropathy: Secondary | ICD-10-CM | POA: Diagnosis not present

## 2023-06-13 DIAGNOSIS — F0280 Dementia in other diseases classified elsewhere without behavioral disturbance: Secondary | ICD-10-CM | POA: Diagnosis not present

## 2023-06-13 DIAGNOSIS — I959 Hypotension, unspecified: Secondary | ICD-10-CM | POA: Diagnosis not present

## 2023-06-13 DIAGNOSIS — K219 Gastro-esophageal reflux disease without esophagitis: Secondary | ICD-10-CM | POA: Diagnosis not present

## 2023-06-13 DIAGNOSIS — G309 Alzheimer's disease, unspecified: Secondary | ICD-10-CM | POA: Diagnosis not present

## 2023-06-20 DIAGNOSIS — M545 Low back pain, unspecified: Secondary | ICD-10-CM | POA: Diagnosis not present

## 2023-06-20 DIAGNOSIS — G9009 Other idiopathic peripheral autonomic neuropathy: Secondary | ICD-10-CM | POA: Diagnosis not present

## 2023-06-20 DIAGNOSIS — G894 Chronic pain syndrome: Secondary | ICD-10-CM | POA: Diagnosis not present

## 2023-06-27 DIAGNOSIS — G309 Alzheimer's disease, unspecified: Secondary | ICD-10-CM | POA: Diagnosis not present

## 2023-06-27 DIAGNOSIS — D519 Vitamin B12 deficiency anemia, unspecified: Secondary | ICD-10-CM | POA: Diagnosis not present

## 2023-06-27 DIAGNOSIS — M810 Age-related osteoporosis without current pathological fracture: Secondary | ICD-10-CM | POA: Diagnosis not present

## 2023-06-27 DIAGNOSIS — K219 Gastro-esophageal reflux disease without esophagitis: Secondary | ICD-10-CM | POA: Diagnosis not present

## 2023-06-27 DIAGNOSIS — G894 Chronic pain syndrome: Secondary | ICD-10-CM | POA: Diagnosis not present

## 2023-07-10 DIAGNOSIS — F331 Major depressive disorder, recurrent, moderate: Secondary | ICD-10-CM | POA: Diagnosis not present

## 2023-07-10 DIAGNOSIS — G300 Alzheimer's disease with early onset: Secondary | ICD-10-CM | POA: Diagnosis not present

## 2023-07-11 DIAGNOSIS — K219 Gastro-esophageal reflux disease without esophagitis: Secondary | ICD-10-CM | POA: Diagnosis not present

## 2023-07-11 DIAGNOSIS — F0280 Dementia in other diseases classified elsewhere without behavioral disturbance: Secondary | ICD-10-CM | POA: Diagnosis not present

## 2023-07-11 DIAGNOSIS — G300 Alzheimer's disease with early onset: Secondary | ICD-10-CM | POA: Diagnosis not present

## 2023-07-11 DIAGNOSIS — F331 Major depressive disorder, recurrent, moderate: Secondary | ICD-10-CM | POA: Diagnosis not present

## 2023-07-17 DIAGNOSIS — I10 Essential (primary) hypertension: Secondary | ICD-10-CM | POA: Diagnosis not present

## 2023-07-17 DIAGNOSIS — E785 Hyperlipidemia, unspecified: Secondary | ICD-10-CM | POA: Diagnosis not present

## 2023-07-17 DIAGNOSIS — Z131 Encounter for screening for diabetes mellitus: Secondary | ICD-10-CM | POA: Diagnosis not present

## 2023-07-17 DIAGNOSIS — Z1329 Encounter for screening for other suspected endocrine disorder: Secondary | ICD-10-CM | POA: Diagnosis not present

## 2023-07-18 DIAGNOSIS — M545 Low back pain, unspecified: Secondary | ICD-10-CM | POA: Diagnosis not present

## 2023-07-18 DIAGNOSIS — G894 Chronic pain syndrome: Secondary | ICD-10-CM | POA: Diagnosis not present

## 2023-07-18 DIAGNOSIS — G9009 Other idiopathic peripheral autonomic neuropathy: Secondary | ICD-10-CM | POA: Diagnosis not present

## 2023-07-28 ENCOUNTER — Inpatient Hospital Stay (HOSPITAL_COMMUNITY)
Admission: EM | Admit: 2023-07-28 | Discharge: 2023-08-01 | DRG: 557 | Disposition: A | Discharge status: Skilled Nursing Facility | Source: Skilled Nursing Facility | Attending: Student | Admitting: Student

## 2023-07-28 ENCOUNTER — Emergency Department (HOSPITAL_COMMUNITY)

## 2023-07-28 ENCOUNTER — Other Ambulatory Visit: Payer: Self-pay

## 2023-07-28 ENCOUNTER — Observation Stay (HOSPITAL_COMMUNITY)

## 2023-07-28 ENCOUNTER — Encounter (HOSPITAL_COMMUNITY): Payer: Self-pay

## 2023-07-28 DIAGNOSIS — G9389 Other specified disorders of brain: Secondary | ICD-10-CM | POA: Diagnosis not present

## 2023-07-28 DIAGNOSIS — Z880 Allergy status to penicillin: Secondary | ICD-10-CM

## 2023-07-28 DIAGNOSIS — Z820 Family history of epilepsy and other diseases of the nervous system: Secondary | ICD-10-CM | POA: Diagnosis not present

## 2023-07-28 DIAGNOSIS — Y92129 Unspecified place in nursing home as the place of occurrence of the external cause: Secondary | ICD-10-CM

## 2023-07-28 DIAGNOSIS — Z7983 Long term (current) use of bisphosphonates: Secondary | ICD-10-CM | POA: Diagnosis not present

## 2023-07-28 DIAGNOSIS — F0390 Unspecified dementia without behavioral disturbance: Secondary | ICD-10-CM | POA: Diagnosis not present

## 2023-07-28 DIAGNOSIS — Z825 Family history of asthma and other chronic lower respiratory diseases: Secondary | ICD-10-CM

## 2023-07-28 DIAGNOSIS — R0902 Hypoxemia: Secondary | ICD-10-CM | POA: Diagnosis present

## 2023-07-28 DIAGNOSIS — Z0389 Encounter for observation for other suspected diseases and conditions ruled out: Secondary | ICD-10-CM | POA: Diagnosis not present

## 2023-07-28 DIAGNOSIS — Z66 Do not resuscitate: Secondary | ICD-10-CM | POA: Diagnosis present

## 2023-07-28 DIAGNOSIS — M810 Age-related osteoporosis without current pathological fracture: Secondary | ICD-10-CM | POA: Diagnosis present

## 2023-07-28 DIAGNOSIS — E039 Hypothyroidism, unspecified: Secondary | ICD-10-CM | POA: Diagnosis present

## 2023-07-28 DIAGNOSIS — Z881 Allergy status to other antibiotic agents status: Secondary | ICD-10-CM | POA: Diagnosis not present

## 2023-07-28 DIAGNOSIS — Z818 Family history of other mental and behavioral disorders: Secondary | ICD-10-CM | POA: Diagnosis not present

## 2023-07-28 DIAGNOSIS — Z91012 Allergy to eggs: Secondary | ICD-10-CM | POA: Diagnosis not present

## 2023-07-28 DIAGNOSIS — R4182 Altered mental status, unspecified: Secondary | ICD-10-CM | POA: Diagnosis not present

## 2023-07-28 DIAGNOSIS — T50915A Adverse effect of multiple unspecified drugs, medicaments and biological substances, initial encounter: Secondary | ICD-10-CM | POA: Diagnosis present

## 2023-07-28 DIAGNOSIS — R531 Weakness: Secondary | ICD-10-CM | POA: Diagnosis not present

## 2023-07-28 DIAGNOSIS — G309 Alzheimer's disease, unspecified: Secondary | ICD-10-CM | POA: Diagnosis present

## 2023-07-28 DIAGNOSIS — F02C0 Dementia in other diseases classified elsewhere, severe, without behavioral disturbance, psychotic disturbance, mood disturbance, and anxiety: Secondary | ICD-10-CM | POA: Diagnosis present

## 2023-07-28 DIAGNOSIS — R7881 Bacteremia: Secondary | ICD-10-CM | POA: Diagnosis not present

## 2023-07-28 DIAGNOSIS — G9341 Metabolic encephalopathy: Secondary | ICD-10-CM | POA: Diagnosis present

## 2023-07-28 DIAGNOSIS — M6282 Rhabdomyolysis: Secondary | ICD-10-CM | POA: Diagnosis present

## 2023-07-28 DIAGNOSIS — G894 Chronic pain syndrome: Secondary | ICD-10-CM | POA: Diagnosis present

## 2023-07-28 DIAGNOSIS — Z8042 Family history of malignant neoplasm of prostate: Secondary | ICD-10-CM | POA: Diagnosis not present

## 2023-07-28 DIAGNOSIS — Z801 Family history of malignant neoplasm of trachea, bronchus and lung: Secondary | ICD-10-CM

## 2023-07-28 DIAGNOSIS — I959 Hypotension, unspecified: Secondary | ICD-10-CM | POA: Diagnosis not present

## 2023-07-28 DIAGNOSIS — E7800 Pure hypercholesterolemia, unspecified: Secondary | ICD-10-CM | POA: Diagnosis present

## 2023-07-28 DIAGNOSIS — Z841 Family history of disorders of kidney and ureter: Secondary | ICD-10-CM | POA: Diagnosis not present

## 2023-07-28 DIAGNOSIS — K219 Gastro-esophageal reflux disease without esophagitis: Secondary | ICD-10-CM | POA: Diagnosis present

## 2023-07-28 DIAGNOSIS — I700 Atherosclerosis of aorta: Secondary | ICD-10-CM | POA: Diagnosis not present

## 2023-07-28 DIAGNOSIS — I6523 Occlusion and stenosis of bilateral carotid arteries: Secondary | ICD-10-CM | POA: Diagnosis not present

## 2023-07-28 DIAGNOSIS — E860 Dehydration: Secondary | ICD-10-CM | POA: Diagnosis present

## 2023-07-28 DIAGNOSIS — R748 Abnormal levels of other serum enzymes: Secondary | ICD-10-CM | POA: Diagnosis present

## 2023-07-28 DIAGNOSIS — Z743 Need for continuous supervision: Secondary | ICD-10-CM | POA: Diagnosis not present

## 2023-07-28 DIAGNOSIS — R0989 Other specified symptoms and signs involving the circulatory and respiratory systems: Secondary | ICD-10-CM | POA: Diagnosis not present

## 2023-07-28 DIAGNOSIS — Z79899 Other long term (current) drug therapy: Secondary | ICD-10-CM

## 2023-07-28 DIAGNOSIS — R569 Unspecified convulsions: Secondary | ICD-10-CM | POA: Diagnosis not present

## 2023-07-28 DIAGNOSIS — M549 Dorsalgia, unspecified: Secondary | ICD-10-CM | POA: Diagnosis not present

## 2023-07-28 DIAGNOSIS — I771 Stricture of artery: Secondary | ICD-10-CM | POA: Diagnosis not present

## 2023-07-28 DIAGNOSIS — R0689 Other abnormalities of breathing: Secondary | ICD-10-CM | POA: Diagnosis not present

## 2023-07-28 DIAGNOSIS — Z781 Physical restraint status: Secondary | ICD-10-CM

## 2023-07-28 DIAGNOSIS — G9340 Encephalopathy, unspecified: Secondary | ICD-10-CM | POA: Diagnosis not present

## 2023-07-28 LAB — COMPREHENSIVE METABOLIC PANEL WITH GFR
ALT: 23 U/L (ref 0–44)
AST: 67 U/L — ABNORMAL HIGH (ref 15–41)
Albumin: 3.4 g/dL — ABNORMAL LOW (ref 3.5–5.0)
Alkaline Phosphatase: 45 U/L (ref 38–126)
Anion gap: 6 (ref 5–15)
BUN: 26 mg/dL — ABNORMAL HIGH (ref 8–23)
CO2: 28 mmol/L (ref 22–32)
Calcium: 8.7 mg/dL — ABNORMAL LOW (ref 8.9–10.3)
Chloride: 107 mmol/L (ref 98–111)
Creatinine, Ser: 0.72 mg/dL (ref 0.44–1.00)
GFR, Estimated: >60 mL/min (ref >60)
Glucose, Bld: 122 mg/dL — ABNORMAL HIGH (ref 70–99)
Potassium: 4 mmol/L (ref 3.5–5.1)
Sodium: 141 mmol/L (ref 135–145)
Total Bilirubin: 0.7 mg/dL (ref 0.0–1.2)
Total Protein: 6.4 g/dL — ABNORMAL LOW (ref 6.5–8.1)

## 2023-07-28 LAB — URINALYSIS, W/ REFLEX TO CULTURE (INFECTION SUSPECTED)
Bacteria, UA: NONE SEEN
Bilirubin Urine: NEGATIVE
Glucose, UA: NEGATIVE mg/dL
Hgb urine dipstick: NEGATIVE
Ketones, ur: 5 mg/dL — AB
Leukocytes,Ua: NEGATIVE
Nitrite: NEGATIVE
Protein, ur: 30 mg/dL — AB
Specific Gravity, Urine: 1.029 (ref 1.005–1.030)
pH: 5 (ref 5.0–8.0)

## 2023-07-28 LAB — CBC WITH DIFFERENTIAL/PLATELET
Abs Immature Granulocytes: 0.03 10*3/uL (ref 0.00–0.07)
Basophils Absolute: 0 10*3/uL (ref 0.0–0.1)
Basophils Relative: 0 %
Eosinophils Absolute: 0.1 10*3/uL (ref 0.0–0.5)
Eosinophils Relative: 1 %
HCT: 38.5 % (ref 36.0–46.0)
Hemoglobin: 12.1 g/dL (ref 12.0–15.0)
Immature Granulocytes: 0 %
Lymphocytes Relative: 12 %
Lymphs Abs: 1.4 10*3/uL (ref 0.7–4.0)
MCH: 29 pg (ref 26.0–34.0)
MCHC: 31.4 g/dL (ref 30.0–36.0)
MCV: 92.3 fL (ref 80.0–100.0)
Monocytes Absolute: 1.6 10*3/uL — ABNORMAL HIGH (ref 0.1–1.0)
Monocytes Relative: 13 %
Neutro Abs: 9 10*3/uL — ABNORMAL HIGH (ref 1.7–7.7)
Neutrophils Relative %: 74 %
Platelets: 283 10*3/uL (ref 150–400)
RBC: 4.17 MIL/uL (ref 3.87–5.11)
RDW: 14.9 % (ref 11.5–15.5)
WBC: 12.1 10*3/uL — ABNORMAL HIGH (ref 4.0–10.5)
nRBC: 0 % (ref 0.0–0.2)

## 2023-07-28 LAB — PROTIME-INR
INR: 1.1 (ref 0.8–1.2)
Prothrombin Time: 14.5 s (ref 11.4–15.2)

## 2023-07-28 LAB — I-STAT CG4 LACTIC ACID, ED: Lactic Acid, Venous: 1.7 mmol/L (ref 0.5–1.9)

## 2023-07-28 LAB — CK: Total CK: 4297 U/L — ABNORMAL HIGH (ref 38–234)

## 2023-07-28 MED ORDER — LACTATED RINGERS IV BOLUS (SEPSIS): 1000.0000 mL | Freq: Once | INTRAVENOUS | Status: AC

## 2023-07-28 MED ORDER — ONDANSETRON HCL 4 MG PO TABS: 4.0000 mg | ORAL_TABLET | Freq: Four times a day (QID) | ORAL | Status: DC | PRN

## 2023-07-28 MED ORDER — ONDANSETRON HCL 4 MG/2ML IJ SOLN: 4.0000 mg | Freq: Four times a day (QID) | INTRAMUSCULAR | Status: DC | PRN

## 2023-07-28 MED ORDER — PREGABALIN 75 MG PO CAPS
75.0000 mg | ORAL_CAPSULE | Freq: Two times a day (BID) | ORAL | Status: DC
  Administered 2023-07-29 – 2023-07-30 (×4): 75 mg via ORAL
  Filled 2023-07-28 (×3): qty 1
  Filled 2023-07-28: qty 3

## 2023-07-28 MED ORDER — DEXTROSE IN LACTATED RINGERS 5 % IV SOLN: INTRAVENOUS | Status: DC

## 2023-07-28 MED ORDER — DONEPEZIL HCL 10 MG PO TABS
10.0000 mg | ORAL_TABLET | Freq: Every day | ORAL | Status: DC
  Administered 2023-07-29 – 2023-07-30 (×3): 10 mg via ORAL
  Filled 2023-07-28: qty 2
  Filled 2023-07-28 (×3): qty 1

## 2023-07-28 MED ORDER — ENOXAPARIN SODIUM 40 MG/0.4ML IJ SOSY
40.0000 mg | PREFILLED_SYRINGE | INTRAMUSCULAR | Status: DC
  Administered 2023-07-29 – 2023-08-01 (×3): 40 mg via SUBCUTANEOUS
  Filled 2023-07-28 (×4): qty 0.4

## 2023-07-28 MED ORDER — GABAPENTIN 100 MG PO CAPS: 100.0000 mg | ORAL_CAPSULE | Freq: Two times a day (BID) | ORAL | Status: DC | PRN

## 2023-07-28 MED ORDER — MEMANTINE HCL 10 MG PO TABS
10.0000 mg | ORAL_TABLET | Freq: Two times a day (BID) | ORAL | Status: DC
  Administered 2023-07-29 – 2023-08-01 (×6): 10 mg via ORAL
  Filled 2023-07-28 (×9): qty 1

## 2023-07-28 NOTE — Plan of Care (Addendum)
 Discussed briefly with Dr. Deatra Face who notes that this patient who presented for increased fatigue over 2 days and reduced responsiveness with a past medical history significant for likely Alzheimer's dementia, hyperlipidemia, hypertension, hypothyroidism, migraines, chronic pain  He noted that she has been somewhat tremulous on examination which appears to be a new finding however she is able to follow commands.  Initially was not speaking but since has started speaking/asking some questions  Laboratory evaluation notable for elevated CK at 4297, AST elevated at 67, ALT 23  Recommend holding statin, check GGT to confirm that AST elevation is secondary to muscle injury and not primary liver injury, okay to obtain routine EEG for screening given increased risk of seizures in dementia but no clear description of seizure here.  Appreciate ED/primary team medical workup for etiology of elevated CK for which the differential is broad (medication effect including statin use, infectious diseases -- note mild leukocytosis, thyroid disease, seizure -- though again note no reported seizure activity).  Tremulousness could also be due to weakness secondary to rhabdomyolysis.  Please trend CK to peak  If patient is not improving with supportive care or additional questions/concerns arise, please reach out for full consultation  These are curbside recommendations based upon the information readily available in the chart on brief review as well as history and examination information provided to me by requesting provider and do not replace a full detailed consult   Basic Metabolic Panel: Recent Labs  Lab 07/28/23 1722  NA 141  K 4.0  CL 107  CO2 28  GLUCOSE 122*  BUN 26*  CREATININE 0.72  CALCIUM 8.7*    CBC: Recent Labs  Lab 07/28/23 1722  WBC 12.1*  NEUTROABS 9.0*  HGB 12.1  HCT 38.5  MCV 92.3  PLT 283    Coagulation Studies: Recent Labs    07/28/23 1749  LABPROT 14.5  INR 1.1     Lab Results  Component Value Date   CKTOTAL 4,297 (H) 07/28/2023

## 2023-07-28 NOTE — ED Triage Notes (Addendum)
 Pt to ED with c/o increased fatigue for past couple of days. Hx dementia. Pt A&Ox1 to self. CBG 131 for EMS. Pt given 500ml bolus NS en route. Pt breathing even and unlabored. Skin breakdown noted to left buttocks. MD aware.

## 2023-07-28 NOTE — H&P (Signed)
 History and Physical    Patient: Kristine Stafford WUJ:[id number] DOB: [birth year] DOA: 07/28/2023 DOS: the patient was seen and examined on 07/28/2023 PCP: Sun, Vyvyan, MD  Patient coming from: SNF  Chief Complaint:  Chief Complaint  Patient presents with   Fatigue   HPI: Kristine Stafford is a 73 y.o. female with medical history significant of dementia, GERD, chronic pain syndrome, migraine headaches, thyroid disease, hyperlipidemia, who is from skilled nursing facility where her husband went today and found her to be altered.  Over the last 2 to 3 days she has been weaker than usual and less responsive.  She also has gradually worsening shaking episodes.  Patient apparently had UTI last week and was on antibiotics.  In the ER patient has continued rythmic movements but no overt seizure.  She has elevated CPK of over 5000.  She has no focal weakness.  It appears more like 3 months.  Does have persistent.  Neurology consulted.  Patient being admitted for observation.  No prior history of seizure.  No indication as to what this could be.  Review of Systems: As mentioned in the history of present illness. All other systems reviewed and are negative. Past Medical History:  Diagnosis Date   Abnormal Pap smear    Chronic pain syndrome    GERD (gastroesophageal reflux disease)    Heart murmur    pt and pt's sig other denies   Herpes simplex without mention of complication    HSV 2    Left hip pain    Major neurocognitive disorder due to Alzheimer's disease 01/15/2021   Migraine    Osteopenia    in neck   Osteoporosis    Pure hypercholesterolemia    PVC (premature ventricular contraction)    Thyroid disease    Past Surgical History:  Procedure Laterality Date   APPENDECTOMY     FOOT SURGERY Left    LUMBAR LAMINECTOMY/DECOMPRESSION MICRODISCECTOMY N/A 09/12/2021   Procedure: LUMBAR 5- SACRUM 1 DECOMPRESSION;  Surgeon: Virl Grimes, MD;  Location: [facility] OR;  Service: Orthopedics;   Laterality: N/A;   WRIST FRACTURE SURGERY Left    metal in left wrist   Social History:  reports that she has never smoked. She has never used smokeless tobacco. She reports current alcohol use of about 1.0 standard drink of alcohol per week. She reports that she does not use drugs.  Allergies  Allergen Reactions   Egg-Derived Products Other (See Comments)    Husband unaware of intolerance   Other     Oats,poppyseeds,yeast,baker and brewers-Husband unaware of intolerance   Penicillins Other (See Comments)    Unknown reaction   Tetracyclines & Related Other (See Comments)    Unknown reaction    Family History  Problem Relation Age of Onset   Emphysema Mother    Lung cancer Mother    Kidney failure Mother    Bipolar disorder Mother    Interstitial cystitis Mother    Prostate cancer Father    Alzheimer's disease Father    Liver disease Sister        hep c    Pleurisy Maternal Grandmother     Prior to Admission medications   Medication Sig Start Date End Date Taking? Authorizing Provider  acetaminophen  (TYLENOL ) 500 MG tablet Take 1,000 mg by mouth every 6 (six) hours as needed (for pain.).    [provider]  alendronate (FOSAMAX) 70 MG tablet Take 70 mg by mouth every Tuesday. Take with a full glass  of water on an empty stomach.    [provider]  atorvastatin (LIPITOR) 20 MG tablet Take 20 mg by mouth in the morning. 11/01/20   [provider]  Calcium Carbonate (CALCIUM 600 PO) Take 600 mg by mouth in the morning.    [provider]  Cholecalciferol (VITAMIN D3) 125 MCG (5000 UT) TABS Take 5,000 Units by mouth in the morning.    [provider]  donepezil  (ARICEPT ) 10 MG tablet Take 1 tablet (10 mg total) by mouth at bedtime. 07/24/22   Wertman, Sara E, PA-C  gabapentin (NEURONTIN) 100 MG capsule Take 100-200 mg by mouth 2 (two) times daily as needed for pain. 08/01/21   [provider]  memantine  (NAMENDA ) 10 MG tablet Take  1 tablet   twice a day 07/24/22   Wertman, Sara E, PA-C  Menthol, Topical Analgesic, (BIOFREEZE EX) Apply 1 Application topically 3 (three) times daily as needed (back pain.).    [provider]  methocarbamol  (ROBAXIN ) 750 MG tablet Take 1 tablet (750 mg total) by mouth every 6 (six) hours as needed for muscle spasms. 09/12/21   McKenzie, Kayla J, PA-C  Polyethyl Glycol-Propyl Glycol (LUBRICANT EYE DROPS) 0.4-0.3 % SOLN Place 1-2 drops into both eyes 3 (three) times daily as needed (dry/irritated eyes.).    [provider]  traMADol (ULTRAM) 50 MG tablet Take 50 mg by mouth 3 (three) times daily as needed (pain.). 12/28/20   [provider]  vitamin B-12 (CYANOCOBALAMIN ) 1000 MCG tablet Take 1,000 mcg by mouth in the morning.    [provider]    Physical Exam: Vitals:   07/28/23 1900 07/28/23 1915 07/28/23 1945 07/28/23 2115  BP: (!) 103/54 (!) 66/31 108/68 101/85  Pulse: (!) 107 (!) 53 (!) 112   Resp: (!) 25 (!) 21 18 18   Temp:   99.2 F (37.3 C)   TempSrc:      SpO2: 99% 100% 100% 100%  Weight:      Height:       Constitutional: Chronically ill looking, continues tremors NAD, calm, comfortable Eyes: PERRL, lids and conjunctivae normal ENMT: Mucous membranes are moist. Posterior pharynx clear of any exudate or lesions.Normal dentition.  Neck: normal, supple, no masses, no thyromegaly Respiratory: clear to auscultation bilaterally, no wheezing, no crackles. Normal respiratory effort. No accessory muscle use.  Cardiovascular: Regular rate and rhythm, no murmurs / rubs / gallops. No extremity edema. 2+ pedal pulses. No carotid bruits.  Abdomen: no tenderness, no masses palpated. No hepatosplenomegaly. Bowel sounds positive.  Musculoskeletal: Good range of motion, no joint swelling or tenderness, Skin: no rashes, lesions, ulcers. No induration Neurologic: CN 2-12 grossly intact. Sensation intact, DTR normal. Strength 5/5 in all 4.  Psychiatric: Awake  and responding to commands but patient having continued tremors  Data Reviewed:  Temperature 99.2, blood pressure 66/31 initially with a pulse of 112, respirate 31 oxygen sat 84% on room air.  Glucose 122 BUN 26, calcium 8.7, albumin is 3.4. CPK 4297, WBC 12.1.PT 14.5 INR 1.1  Assessment and Plan:  #1 acute metabolic encephalopathy with tremors: Patient will be admitted to medical service.  Neurology following.  Suspected some kind of myoclonus or metabolic causes.  Has no prior history of seizure and does not appear to be having a true seizure.  Review of medications and other medical conditions will be done.  She is slightly hypoxic on arrival but currently on oxygen.  Neurology following.  EEG could be tried.  Will  monitor and follow recommendations from neurology.  Will recheck urinalysis to rule out UTI.  #2 Alzheimer's dementia: Patient on treatment.  No agitation, continue home regimen  #3 GERD: Continue with PPIs  #4 mild rhabdomyolysis: Probably from the tremors.  No fall.  This is nontraumatic rhabdomyolysis.  Continue to monitor and hydrate.  Patient has significant bilateral lower extremity edema probably from protein calorie malnutrition.  #5 hypothyroidism: Continue with levothyroxine.  #6 hyperlipidemia: Continue with statin  #7 chronic pain syndrome: Continue chronic pain management.      Advance Care Planning:   Code Status: Full Code   Consults: Dr. Cleone Dad, neurologist  Family Communication: Husband at bedside  Severity of Illness: The appropriate patient status for this patient is OBSERVATION. Observation status is judged to be reasonable and necessary in order to provide the required intensity of service to ensure the patient's safety. The patient's presenting symptoms, physical exam findings, and initial radiographic and laboratory data in the context of their medical condition is felt to place them at decreased risk for further clinical deterioration.  Furthermore, it is anticipated that the patient will be medically stable for discharge from the hospital within 2 midnights of admission.   AuthorCarolin Chyle, MD 07/28/2023 10:32 PM  For on call review [email or web address].

## 2023-07-28 NOTE — ED Provider Notes (Signed)
 Kristine Stafford Provider Note   CSN: [id number] Arrival date & time: 07/28/23  4098     Patient presents with: Fatigue   Kristine Stafford is a 73 y.o. female.   HPI     73 year old patient comes in with chief complaint of s altered mental status. History for patient's husband.  Patient has history of dementia.  According to the patient's husband, over the last 2 to 3 days, patient has been weaker than usual, less responsive and alert.  He has also noted more shaking episodes.  Patient resides at nursing home.  Last week she was given antibiotics for questionable UTI. Patient noted to have tremors during my assessment.  Per husband, there is no history of essential tremor or Parkinson's disease.  No new medications.  She is not on any new medications.  Prior to Admission medications   Medication Sig Start Date End Date Taking? Authorizing Provider  acetaminophen  (TYLENOL ) 500 MG tablet Take 1,000 mg by mouth every 6 (six) hours as needed (for pain.).    [provider]  alendronate (FOSAMAX) 70 MG tablet Take 70 mg by mouth every Tuesday. Take with a full glass of water on an empty stomach.    [provider]  atorvastatin (LIPITOR) 20 MG tablet Take 20 mg by mouth in the morning. 11/01/20   [provider]  Calcium Carbonate (CALCIUM 600 PO) Take 600 mg by mouth in the morning.    [provider]  Cholecalciferol (VITAMIN D3) 125 MCG (5000 UT) TABS Take 5,000 Units by mouth in the morning.    [provider]  donepezil  (ARICEPT ) 10 MG tablet Take 1 tablet (10 mg total) by mouth at bedtime. 07/24/22   Stafford, Kristine E, PA-C  gabapentin (NEURONTIN) 100 MG capsule Take 100-200 mg by mouth 2 (two) times daily as needed for pain. 08/01/21   [provider]  memantine  (NAMENDA ) 10 MG tablet Take 1 tablet   twice a day 07/24/22   Stafford, Kristine E, PA-C  Menthol, Topical Analgesic, (BIOFREEZE EX)  Apply 1 Application topically 3 (three) times daily as needed (back pain.).    [provider]  methocarbamol  (ROBAXIN ) 750 MG tablet Take 1 tablet (750 mg total) by mouth every 6 (six) hours as needed for muscle spasms. 09/12/21   McKenzie, Kayla J, PA-C  Polyethyl Glycol-Propyl Glycol (LUBRICANT EYE DROPS) 0.4-0.3 % SOLN Place 1-2 drops into both eyes 3 (three) times daily as needed (dry/irritated eyes.).    [provider]  traMADol (ULTRAM) 50 MG tablet Take 50 mg by mouth 3 (three) times daily as needed (pain.). 12/28/20   [provider]  vitamin B-12 (CYANOCOBALAMIN ) 1000 MCG tablet Take 1,000 mcg by mouth in the morning.    [provider]    Allergies: Egg-derived products, Other, Penicillins, and Tetracyclines & related    Review of Systems  All other systems reviewed and are negative.   Updated Vital Signs BP 101/85   Pulse (!) 112   Temp 99.2 F (37.3 C)   Resp 18   Ht 5' 7 (1.702 m)   Wt 67.2 kg   SpO2 100%   BMI 23.21 kg/m   Physical Exam Vitals and nursing note reviewed.  Constitutional:      General: She is not in acute distress.    Appearance: She is well-developed.  HENT:     Head: Atraumatic.   Cardiovascular:     Rate and Rhythm: Normal  rate.  Pulmonary:     Effort: Pulmonary effort is normal.   Musculoskeletal:     Cervical back: Normal range of motion and neck supple.   Skin:    General: Skin is warm and dry.   Neurological:     Mental Status: Mental status is at baseline.     Cranial Nerves: No cranial nerve deficit.     Sensory: No sensory deficit.     Motor: No weakness.     (all labs ordered are listed, but only abnormal results are displayed) Labs Reviewed  COMPREHENSIVE METABOLIC PANEL WITH GFR - Abnormal; Notable for the following components:      Result Value   Glucose, Bld 122 (*)    BUN 26 (*)    Calcium 8.7 (*)    Total Protein 6.4 (*)    Albumin 3.4 (*)    AST 67 (*)    All other  components within normal limits  CBC WITH DIFFERENTIAL/PLATELET - Abnormal; Notable for the following components:   WBC 12.1 (*)    Neutro Abs 9.0 (*)    Monocytes Absolute 1.6 (*)    All other components within normal limits  URINALYSIS, W/ REFLEX TO CULTURE (INFECTION SUSPECTED) - Abnormal; Notable for the following components:   Ketones, ur 5 (*)    Protein, ur 30 (*)    All other components within normal limits  CK - Abnormal; Notable for the following components:   Total CK 4,297 (*)    All other components within normal limits  CULTURE, BLOOD (ROUTINE X 2)  CULTURE, BLOOD (ROUTINE X 2)  PROTIME-INR  I-STAT CG4 LACTIC ACID, ED    EKG: EKG Interpretation Date/Time:  Monday July 28 2023 17:56:49 EDT Ventricular Rate:  108 PR Interval:    QRS Duration:  147 QT Interval:  310 QTC Calculation: 416 R Axis:   19  Text Interpretation: Sinus tachycardia Artifact Confirmed by Deatra Face ([id number]) on 07/28/2023 7:36:08 PM  Radiology: Lenell Query Chest Port 1 View Result Date: 07/28/2023 CLINICAL DATA:  Questionable sepsis - evaluate for abnormality , increased fatigue EXAM: PORTABLE CHEST - 1 VIEW COMPARISON:  April 27, 2023 FINDINGS: Lower lung volumes. No focal airspace consolidation, pleural effusion, or pneumothorax. Streaky atelectasis of the left lung base. The cardiac silhouette is at the upper limits of normal, likely accentuated by AP technique and low lung volumes. Tortuous aorta with aortic atherosclerosis. No acute fracture or destructive lesions. Multilevel thoracic osteophytosis. IMPRESSION: No acute cardiopulmonary abnormality. Electronically Signed   By: Rance Burrows M.D.   On: 07/28/2023 17:50     Procedures   Medications Ordered in the ED  lactated ringers  bolus 1,000 mL (0 mLs Intravenous Stopped 07/28/23 1917)                                    Medical Decision Making Amount and/or Complexity of Data Reviewed Labs: ordered. Radiology:  ordered.  Risk Decision regarding hospitalization.   73 year old patient with pertinent past medical history of neurocognitive delay comes in with chief complaint of acute altered mental status change.  Collateral history provided by patient's husband.  I have reviewed patient's previous records including the medications.  Differential diagnosis considered for this patient includes: ICH / Stroke, acute coronary syndrome, seizures, infection - UTI/Pneumonia/soft tissue infection leading to encephalopathy, encephalopathy due to electrolyte abnormality or drug interactions or toxins or metabolic conditions like adrenal insufficiency,  hyperglycemia, paraneoplastic process  Based on initial assessment, higher suspicion for infection, metabolic issues.  Low suspicion for medication side effects and there are no new medications.  Low suspicion for stroke.  Plan is to get basic labs and reassess the patient.  Reassessment: I have independently reviewed the following labs: UA is normal.  CBC is normal. Patient CK is over 4000.  I have independently reviewed the following imaging: X-ray of the chest, there is no evidence of focal pneumonia.  Patient was also reassessed at 10 PM.   Patient was having tremors, but she was also able to talk to her husband during the episode.  Tremor stopped spontaneously. Very atypical -not likely seizure.  I still consulted neurology to see if patient can get an EEG.  Although, he does not appear to be at primary neurologic issue to them as well over the phone, they do not think the EEG is a bad idea.  Will admit patient for nontraumatic rhabdo.    Final diagnoses:  Non-traumatic rhabdomyolysis  Altered mental status, unspecified altered mental status type  Dehydration    ED Discharge Orders     None          Deatra Face, MD 07/28/23 2250

## 2023-07-29 ENCOUNTER — Inpatient Hospital Stay (HOSPITAL_COMMUNITY)

## 2023-07-29 DIAGNOSIS — E860 Dehydration: Secondary | ICD-10-CM

## 2023-07-29 DIAGNOSIS — G9340 Encephalopathy, unspecified: Secondary | ICD-10-CM | POA: Diagnosis not present

## 2023-07-29 DIAGNOSIS — Z801 Family history of malignant neoplasm of trachea, bronchus and lung: Secondary | ICD-10-CM | POA: Diagnosis not present

## 2023-07-29 DIAGNOSIS — Z881 Allergy status to other antibiotic agents status: Secondary | ICD-10-CM | POA: Diagnosis not present

## 2023-07-29 DIAGNOSIS — M6282 Rhabdomyolysis: Secondary | ICD-10-CM | POA: Diagnosis present

## 2023-07-29 DIAGNOSIS — Z8042 Family history of malignant neoplasm of prostate: Secondary | ICD-10-CM | POA: Diagnosis not present

## 2023-07-29 DIAGNOSIS — R4182 Altered mental status, unspecified: Secondary | ICD-10-CM | POA: Diagnosis not present

## 2023-07-29 DIAGNOSIS — Z66 Do not resuscitate: Secondary | ICD-10-CM | POA: Diagnosis present

## 2023-07-29 DIAGNOSIS — R569 Unspecified convulsions: Secondary | ICD-10-CM | POA: Diagnosis not present

## 2023-07-29 DIAGNOSIS — Z7983 Long term (current) use of bisphosphonates: Secondary | ICD-10-CM | POA: Diagnosis not present

## 2023-07-29 DIAGNOSIS — Y92129 Unspecified place in nursing home as the place of occurrence of the external cause: Secondary | ICD-10-CM | POA: Diagnosis not present

## 2023-07-29 DIAGNOSIS — F02C0 Dementia in other diseases classified elsewhere, severe, without behavioral disturbance, psychotic disturbance, mood disturbance, and anxiety: Secondary | ICD-10-CM | POA: Diagnosis present

## 2023-07-29 DIAGNOSIS — G9341 Metabolic encephalopathy: Secondary | ICD-10-CM | POA: Diagnosis present

## 2023-07-29 DIAGNOSIS — Z781 Physical restraint status: Secondary | ICD-10-CM | POA: Diagnosis not present

## 2023-07-29 DIAGNOSIS — R7881 Bacteremia: Secondary | ICD-10-CM | POA: Diagnosis not present

## 2023-07-29 DIAGNOSIS — E7800 Pure hypercholesterolemia, unspecified: Secondary | ICD-10-CM | POA: Diagnosis present

## 2023-07-29 DIAGNOSIS — Z820 Family history of epilepsy and other diseases of the nervous system: Secondary | ICD-10-CM | POA: Diagnosis not present

## 2023-07-29 DIAGNOSIS — Z91012 Allergy to eggs: Secondary | ICD-10-CM | POA: Diagnosis not present

## 2023-07-29 DIAGNOSIS — G894 Chronic pain syndrome: Secondary | ICD-10-CM | POA: Diagnosis present

## 2023-07-29 DIAGNOSIS — R748 Abnormal levels of other serum enzymes: Secondary | ICD-10-CM | POA: Diagnosis present

## 2023-07-29 DIAGNOSIS — Z841 Family history of disorders of kidney and ureter: Secondary | ICD-10-CM | POA: Diagnosis not present

## 2023-07-29 DIAGNOSIS — Z880 Allergy status to penicillin: Secondary | ICD-10-CM | POA: Diagnosis not present

## 2023-07-29 DIAGNOSIS — R0902 Hypoxemia: Secondary | ICD-10-CM | POA: Diagnosis present

## 2023-07-29 DIAGNOSIS — E039 Hypothyroidism, unspecified: Secondary | ICD-10-CM | POA: Diagnosis present

## 2023-07-29 DIAGNOSIS — Z818 Family history of other mental and behavioral disorders: Secondary | ICD-10-CM | POA: Diagnosis not present

## 2023-07-29 DIAGNOSIS — K219 Gastro-esophageal reflux disease without esophagitis: Secondary | ICD-10-CM | POA: Diagnosis present

## 2023-07-29 DIAGNOSIS — G309 Alzheimer's disease, unspecified: Secondary | ICD-10-CM | POA: Diagnosis present

## 2023-07-29 DIAGNOSIS — Z79899 Other long term (current) drug therapy: Secondary | ICD-10-CM | POA: Diagnosis not present

## 2023-07-29 DIAGNOSIS — Z825 Family history of asthma and other chronic lower respiratory diseases: Secondary | ICD-10-CM | POA: Diagnosis not present

## 2023-07-29 LAB — BLOOD CULTURE ID PANEL (REFLEXED) - BCID2

## 2023-07-29 LAB — COMPREHENSIVE METABOLIC PANEL WITH GFR
ALT: 22 U/L (ref 0–44)
AST: 49 U/L — ABNORMAL HIGH (ref 15–41)
Albumin: 3 g/dL — ABNORMAL LOW (ref 3.5–5.0)
Alkaline Phosphatase: 38 U/L (ref 38–126)
Anion gap: 8 (ref 5–15)
BUN: 17 mg/dL (ref 8–23)
CO2: 24 mmol/L (ref 22–32)
Calcium: 8.7 mg/dL — ABNORMAL LOW (ref 8.9–10.3)
Chloride: 107 mmol/L (ref 98–111)
Creatinine, Ser: 0.6 mg/dL (ref 0.44–1.00)
GFR, Estimated: >60 mL/min (ref >60)
Glucose, Bld: 109 mg/dL — ABNORMAL HIGH (ref 70–99)
Potassium: 3.7 mmol/L (ref 3.5–5.1)
Sodium: 139 mmol/L (ref 135–145)
Total Bilirubin: 0.5 mg/dL (ref 0.0–1.2)
Total Protein: 5.9 g/dL — ABNORMAL LOW (ref 6.5–8.1)

## 2023-07-29 LAB — CBC
HCT: 34.9 % — ABNORMAL LOW (ref 36.0–46.0)
Hemoglobin: 11.2 g/dL — ABNORMAL LOW (ref 12.0–15.0)
MCH: 29.2 pg (ref 26.0–34.0)
MCHC: 32.1 g/dL (ref 30.0–36.0)
MCV: 90.9 fL (ref 80.0–100.0)
Platelets: 221 10*3/uL (ref 150–400)
RBC: 3.84 MIL/uL — ABNORMAL LOW (ref 3.87–5.11)
RDW: 15 % (ref 11.5–15.5)
WBC: 8.5 10*3/uL (ref 4.0–10.5)
nRBC: 0 % (ref 0.0–0.2)

## 2023-07-29 LAB — CK: Total CK: 2439 U/L — ABNORMAL HIGH (ref 38–234)

## 2023-07-29 MED ORDER — LACTATED RINGERS IV SOLN: INTRAVENOUS | Status: AC

## 2023-07-29 MED ORDER — HALOPERIDOL LACTATE 5 MG/ML IJ SOLN
5.0000 mg | Freq: Four times a day (QID) | INTRAMUSCULAR | Status: DC | PRN
  Administered 2023-07-29 – 2023-07-30 (×2): 5 mg via INTRAVENOUS
  Filled 2023-07-29 (×2): qty 1

## 2023-07-29 NOTE — Progress Notes (Signed)
 Patient was receiving patient care when she began to be combative, hitting staff. IV was pulled by patient and telemetry as well. Order for soft wrist restraints and Haldol by MD Joan Mouton. Restraints protocol initiated. Patient is now calm in bed, resting comfortably. Toileted and all needs met at this time.

## 2023-07-29 NOTE — Hospital Course (Signed)
 73 y.o. female with medical history significant of dementia, GERD, chronic pain syndrome, migraine headaches, thyroid disease, hyperlipidemia, who is from skilled nursing facility where her husband went today and found her to be altered.  Over the last 2 to 3 days she has been weaker than usual and less responsive.  She also has gradually worsening shaking episodes.  Patient apparently had UTI last week and was on antibiotics.  In the ER patient has continued rythmic movements but no overt seizure.  She has elevated CPK of over 5000.  She has no focal weakness

## 2023-07-29 NOTE — Progress Notes (Addendum)
 EEG complete - results pending

## 2023-07-29 NOTE — Evaluation (Signed)
 Physical Therapy Evaluation Patient Details Name: Kristine Stafford MRN: [id number] DOB: Jun 27, 1950 Today's Date: 07/29/2023  History of Present Illness  Patient is 73 y.o. female presented from SNF for AMS and increased weakness of ~2-3 days. Pt with recent UTI and in ED observed to have continual rhythmic movements but no seizure. CPK elevated and pt admitted for non-traumatic rhabdo. PMH significant for significant of dementia, GERD, chronic pain syndrome, migraine headach   Clinical Impression  Kristine Stafford is 73 y.o. female admitted with above HPI and diagnosis. Patient is currently limited by functional impairments below (see PT problem list). Patient is a resident in memory care at [facility] and is mod ind with RW for mobility at baseline. Currently pt most limited by increased confusion and requires frequent redirection for safety with mobility. Pt's spouse present and provided assist to redirect as able. Pt is mobilizing well overall requiring min assist for balance and safety. Patient will benefit from continued skilled PT interventions to address impairments and progress independence with mobility, recommending return to memory care facility when medically ready with HHPT follow up. Acute PT will follow and progress as able.         If plan is discharge home, recommend the following: A little help with walking and/or transfers;A little help with bathing/dressing/bathroom;Assistance with cooking/housework;Direct supervision/assist for medications management;Help with stairs or ramp for entrance;Assist for transportation;Supervision due to cognitive status   Can travel by private vehicle        Equipment Recommendations None recommended by PT  Recommendations for Other Services       Functional Status Assessment Patient has had a recent decline in their functional status and demonstrates the ability to make significant improvements in function in a reasonable and  predictable amount of time.     Precautions / Restrictions Precautions Precautions: Fall;Other (comment) Recall of Precautions/Restrictions: Impaired Precaution/Restrictions Comments: monitor HR, restlessness Restrictions Weight Bearing Restrictions Per Provider Order: No      Mobility  Bed Mobility Overal bed mobility: Needs Assistance Bed Mobility: Sit to Supine, Supine to Sit     Supine to sit: Mod assist Sit to supine: Total assist, +2 for physical assistance, +2 for safety/equipment   General bed mobility comments: cues to bring LE to EOB, assist to lift trunk with pt initially refusing assist but noted to be frustrated by inability to sit EOB without assist. Once sheets changed, pt still refused to return to supine so assisted back to bed with Total A x 2    Transfers Overall transfer level: Needs assistance Equipment used: Rolling walker (2 wheels) Transfers: Sit to/from Stand Sit to Stand: Min assist, +2 safety/equipment           General transfer comment: Husband at bedside, Min A x 2 for safety to stand with RW    Ambulation/Gait Ambulation/Gait assistance: Min assist, +2 safety/equipment, +2 physical assistance Gait Distance (Feet): 10 Feet   Gait Pattern/deviations: Step-through pattern, Decreased stride length, Narrow base of support Gait velocity: decr     General Gait Details: pt impulsive and unsafe but overall steady with no LOB. poor awareness of lines and safety for self and others. MAX redirection needed for short bouts in room.  Stairs            Wheelchair Mobility     Tilt Bed    Modified Rankin (Stroke Patients Only)       Balance Overall balance assessment: Needs assistance Sitting-balance support: Feet supported, No upper extremity supported  Sitting balance-Leahy Scale: Fair     Standing balance support: Bilateral upper extremity supported, During functional activity Standing balance-Leahy Scale: Poor                                Pertinent Vitals/Pain Pain Assessment Pain Assessment: Faces Faces Pain Scale: Hurts a little bit Pain Location: bottom Pain Descriptors / Indicators: Guarding, Grimacing    Home Living Family/patient expects to be discharged to:: Other (Comment)                   Additional Comments: from [facility] memory care    Prior Function Prior Level of Function : Needs assist             Mobility Comments: RW for mobility, no recent falls. had been found sleeping on floor in room and bathroom but thought to be intentional - not a fall ADLs Comments: recently resistant to dressiing/bathing assist from staff     Extremity/Trunk Assessment   Upper Extremity Assessment Upper Extremity Assessment: Defer to OT evaluation    Lower Extremity Assessment Lower Extremity Assessment: Overall WFL for tasks assessed (good functional strength, unable to formally test)    Cervical / Trunk Assessment Cervical / Trunk Assessment: Normal  Communication   Communication Communication: No apparent difficulties    Cognition Arousal: Alert Behavior During Therapy: Restless, Agitated, Impulsive   PT - Cognitive impairments: History of cognitive impairments                       PT - Cognition Comments: husband helpful to redirect, pt easily frustrated Following commands: Impaired Following commands impaired: Follows one step commands with increased time, Follows one step commands inconsistently     Cueing Cueing Techniques: Verbal cues, Gestural cues, Tactile cues, Visual cues     General Comments General comments (skin integrity, edema, etc.): Husband, Josiah Nigh, present hands on to assist as needed, very helpful    Exercises     Assessment/Plan    PT Assessment Patient needs continued PT services  PT Problem List Decreased activity tolerance;Decreased balance;Decreased mobility;Decreased cognition;Decreased knowledge of use of DME;Decreased  safety awareness;Decreased knowledge of precautions       PT Treatment Interventions DME instruction;Gait training;Stair training;Functional mobility training;Therapeutic activities;Therapeutic exercise;Balance training;Neuromuscular re-education;Cognitive remediation;Patient/family education    PT Goals (Current goals can be found in the Care Plan section)  Acute Rehab PT Goals Patient Stated Goal: none stated PT Goal Formulation: Patient unable to participate in goal setting Time For Goal Achievement: 08/12/23 Potential to Achieve Goals: Good    Frequency Min 1X/week     Co-evaluation               AM-PAC PT 6 Clicks Mobility  Outcome Measure Help needed turning from your back to your side while in a flat bed without using bedrails?: A Little Help needed moving from lying on your back to sitting on the side of a flat bed without using bedrails?: A Little Help needed moving to and from a bed to a chair (including a wheelchair)?: A Little Help needed standing up from a chair using your arms (e.g., wheelchair or bedside chair)?: A Little Help needed to walk in hospital room?: A Little Help needed climbing 3-5 steps with a railing? : Total 6 Click Score: 16    End of Session   Activity Tolerance: Other (comment) (limited by confusion and frustration) Patient left:  in bed;with call bell/phone within reach;with bed alarm set;with family/visitor present Nurse Communication: Mobility status PT Visit Diagnosis: Muscle weakness (generalized) (M62.81);Difficulty in walking, not elsewhere classified (R26.2);Unsteadiness on feet (R26.81);Other symptoms and signs involving the nervous system (R29.898);Other abnormalities of gait and mobility (R26.89)    Time: 7829-5621 PT Time Calculation (min) (ACUTE ONLY): 20 min   Charges:   PT Evaluation $PT Eval Moderate Complexity: 1 Mod   PT General Charges $$ ACUTE PT VISIT: 1 Visit        Tish Forge, DPT Acute Rehabilitation  Services Office [phone]  07/29/23 4:35 PM

## 2023-07-29 NOTE — Evaluation (Signed)
 Occupational Therapy Evaluation Patient Details Name: Kristine Stafford MRN: [id number] DOB: [birth year] Today's Date: 07/29/2023   History of Present Illness   Patient is 73 y.o. female presented from SNF for AMS and increased weakness of ~2-3 days. Pt with recent UTI and in ED observed to have continual rhythmic movements but no seizure. CPK elevated and pt admitted for non-traumatic rhabdo. PMH significant for significant of dementia, GERD, chronic pain syndrome, migraine headach     Clinical Impressions PTA, pt resides at [facility] memory care, typically ambulatory with RW and receives assist with ADLs from staff (though recently increasingly resistant per spouse). Pt presents now initially pleasant and cooperative albeit frustrated by hospitalization. However, once mobilizing in room with OT, pt perseverating on continuing to mobilize, avoiding sitting on any surface (reporting uncomfortable surfaces) and pulling off bandages/tele leads w/ difficulty redirecting. Husband present and PT entering to assist in deescalating situation for safe return to bed. Will follow distantly for OT while admitted. Recommend return to memory care at DC w/ follow up OT as needed. HR notably elevated with pt increasing agitation.      If plan is discharge home, recommend the following:   A lot of help with bathing/dressing/bathroom     Functional Status Assessment   Patient has had a recent decline in their functional status and demonstrates the ability to make significant improvements in function in a reasonable and predictable amount of time.     Equipment Recommendations   None recommended by OT     Recommendations for Other Services         Precautions/Restrictions   Precautions Precautions: Fall;Other (comment) Recall of Precautions/Restrictions: Impaired Precaution/Restrictions Comments: monitor HR, restlessness Restrictions Weight Bearing Restrictions Per Provider Order:  No     Mobility Bed Mobility Overal bed mobility: Needs Assistance Bed Mobility: Sit to Supine, Supine to Sit     Supine to sit: Mod assist Sit to supine: Total assist, +2 for physical assistance, +2 for safety/equipment   General bed mobility comments: cues to bring LE to EOB, assist to lift trunk with pt initially refusing assist but noted to be frustrated by inability to sit EOB without assist. Once sheets changed, pt still refused to return to supine so assisted back to bed with Total A x 2    Transfers Overall transfer level: Needs assistance Equipment used: Rolling walker (2 wheels) Transfers: Sit to/from Stand Sit to Stand: Min assist, +2 safety/equipment           General transfer comment: Husband at bedside, Min A x 2 for safety to stand with RW      Balance Overall balance assessment: Needs assistance Sitting-balance support: Feet supported, No upper extremity supported Sitting balance-Leahy Scale: Fair     Standing balance support: Bilateral upper extremity supported, During functional activity Standing balance-Leahy Scale: Poor                             ADL either performed or assessed with clinical judgement   ADL Overall ADL's : Needs assistance/impaired Eating/Feeding: Supervision/ safety;Minimal assistance;Sitting   Grooming: Minimal assistance;Sitting   Upper Body Bathing: Sitting;Maximal assistance   Lower Body Bathing: Maximal assistance;Sitting/lateral leans;Sit to/from stand   Upper Body Dressing : Maximal assistance;Sitting   Lower Body Dressing: Maximal assistance;Sitting/lateral leans;Sit to/from stand       Toileting- Architect and Hygiene: Maximal assistance;Sit to/from stand       Functional mobility during ADLs:  Contact guard assist;Minimal assistance;Rolling walker (2 wheels) General ADL Comments: limited by cognition, once became restless and unredirectable, paced around room with RW refusing to sit on  any surface. reported sheets old and scratchy - OT located silky sheets and pad though pt still resistant to this - eventually had to be laid down back to bed for safety     Vision Baseline Vision/History: 1 Wears glasses Ability to See in Adequate Light: 0 Adequate Patient Visual Report: No change from baseline Vision Assessment?: No apparent visual deficits     Perception         Praxis         Pertinent Vitals/Pain Pain Assessment Pain Assessment: Faces Faces Pain Scale: Hurts a little bit Pain Location: bottom Pain Descriptors / Indicators: Guarding, Grimacing Pain Intervention(s): Monitored during session, Other (comment) (pt declined replacing bandage that she had pulled off; declined cream)     Extremity/Trunk Assessment Upper Extremity Assessment Upper Extremity Assessment: Overall WFL for tasks assessed;Right hand dominant   Lower Extremity Assessment Lower Extremity Assessment: Defer to PT evaluation   Cervical / Trunk Assessment Cervical / Trunk Assessment: Normal   Communication Communication Communication: No apparent difficulties   Cognition Arousal: Alert Behavior During Therapy: Restless, Agitated, Impulsive Cognition: History of cognitive impairments             OT - Cognition Comments: hx of dementia, initially pleasant, would smile to humor. quickly became restless, difficult to redirect and impulsive - pulling off tele leads, sacral bandage and attempted to pull IV. aggressively slammed RW on ground once and swatted away OT attempting to assist pt                 Following commands: Impaired Following commands impaired: Follows one step commands with increased time, Follows one step commands inconsistently     Cueing  General Comments   Cueing Techniques: Verbal cues;Gestural cues;Tactile cues;Visual cues  Husband, Josiah Nigh, present hands on to assist as needed   Exercises     Shoulder Instructions      Home Living Family/patient  expects to be discharged to:: Other (Comment)                                 Additional Comments: from [facility] memory care      Prior Functioning/Environment Prior Level of Function : Needs assist             Mobility Comments: RW for mobility, no recent falls. had been found sleeping on floor in room and bathroom but thought to be intentional - not a fall ADLs Comments: recently resistant to dressiing/bathing assist from staff    OT Problem List: Decreased cognition;Decreased safety awareness   OT Treatment/Interventions: Self-care/ADL training;Therapeutic exercise;Energy conservation;DME and/or AE instruction;Therapeutic activities;Patient/family education      OT Goals(Current goals can be found in the care plan section)   Acute Rehab OT Goals Patient Stated Goal: pt wants to get back to finish Love [employer]es [employer] movie OT Goal Formulation: With patient/family Time For Goal Achievement: 08/12/23 Potential to Achieve Goals: Fair   OT Frequency:  Min 1X/week    Co-evaluation              AM-PAC OT 6 Clicks Daily Activity     Outcome Measure Help from another person eating meals?: A Little Help from another person taking care of personal grooming?: A Little Help from another person toileting,  which includes using toliet, bedpan, or urinal?: A Lot Help from another person bathing (including washing, rinsing, drying)?: A Lot Help from another person to put on and taking off regular upper body clothing?: A Lot Help from another person to put on and taking off regular lower body clothing?: A Lot 6 Click Score: 14   End of Session Equipment Utilized During Treatment: Rolling walker (2 wheels) Nurse Communication: Mobility status  Activity Tolerance: Treatment limited secondary to agitation Patient left: in bed;with call bell/phone within reach;with bed alarm set;with family/visitor present  OT Visit Diagnosis: Other symptoms and signs  involving cognitive function                Time: 8295-6213 OT Time Calculation (min): 55 min Charges:  OT General Charges $OT Visit: 1 Visit OT Evaluation $OT Eval Moderate Complexity: 1 Mod OT Treatments $Therapeutic Activity: 23-37 mins  Lawrence Pretty, OTR/L Acute Rehab Services Office: [phone]   Shireen Dory 07/29/2023, 2:36 PM

## 2023-07-29 NOTE — TOC CM/SW Note (Addendum)
 Transition of Care [facility]) - Inpatient Brief Assessment   Patient Details  Name: Kristine Stafford MRN: [id number] Date of Birth: 11-25-1950  Transition of [facility]) CM/SW Contact:    Jennett Model, RN Phone Number: 07/29/2023, 12:06 PM   Clinical Narrative: Per Kathi Panning, From [facility], has PCP and insurance on file, Per [facility] rep states she has HHRN with [facility] for blisters on buttock, has walker at home.  States he  will transport them home at [employer] and he is support system, states gets medications from [facility] thru [facility] [employer].  Pta self ambulatory with walker.  NCM confirmed with Bartholomew Light with [facility] regarding the [facility], she states she also gets therapy HHPT.  Will need orders per Bartholomew Light.    Transition of Care Asessment: Insurance and Status: Insurance coverage has been reviewed Patient has primary care physician: Yes Home environment has been reviewed: [facility] Prior level of function:: ambulatory with walker Prior/Current Home Services: Current home services (walker) Social Drivers of Health Review: SDOH reviewed no interventions necessary Readmission risk has been reviewed: Yes Transition of care needs: no transition of care needs at this time

## 2023-07-29 NOTE — Progress Notes (Signed)
 PHARMACY - PHYSICIAN COMMUNICATION CRITICAL VALUE ALERT - BLOOD CULTURE IDENTIFICATION (BCID)  Kristine Stafford is an 73 y.o. female who presented to [facility] on 07/28/2023 with a chief complaint of AMS  Assessment:  1/4 blood cx bottles growing MRSE (?contaminant)  Name of physician (or Provider) Contacted: Dr. Sharion Davidson  Current antibiotics: None  Changes to prescribed antibiotics recommended:  No additional antibiotics needed at this time - will watch and follow  Results for orders placed or performed during the hospital encounter of 07/28/23  Blood Culture ID Panel (Reflexed) (Collected: 07/28/2023  5:49 PM)  Result Value Ref Range   Enterococcus faecalis NOT DETECTED NOT DETECTED   Enterococcus Faecium NOT DETECTED NOT DETECTED   Listeria monocytogenes NOT DETECTED NOT DETECTED   Staphylococcus species DETECTED (A) NOT DETECTED   Staphylococcus aureus (BCID) NOT DETECTED NOT DETECTED   Staphylococcus epidermidis DETECTED (A) NOT DETECTED   Staphylococcus lugdunensis NOT DETECTED NOT DETECTED   Streptococcus species NOT DETECTED NOT DETECTED   Streptococcus agalactiae NOT DETECTED NOT DETECTED   Streptococcus pneumoniae NOT DETECTED NOT DETECTED   Streptococcus pyogenes NOT DETECTED NOT DETECTED   A.calcoaceticus-baumannii NOT DETECTED NOT DETECTED   Bacteroides fragilis NOT DETECTED NOT DETECTED   Enterobacterales NOT DETECTED NOT DETECTED   Enterobacter cloacae complex NOT DETECTED NOT DETECTED   Escherichia coli NOT DETECTED NOT DETECTED   Klebsiella aerogenes NOT DETECTED NOT DETECTED   Klebsiella oxytoca NOT DETECTED NOT DETECTED   Klebsiella pneumoniae NOT DETECTED NOT DETECTED   Proteus species NOT DETECTED NOT DETECTED   Salmonella species NOT DETECTED NOT DETECTED   Serratia marcescens NOT DETECTED NOT DETECTED   Haemophilus influenzae NOT DETECTED NOT DETECTED   Neisseria meningitidis NOT DETECTED NOT DETECTED   Pseudomonas aeruginosa NOT DETECTED NOT DETECTED    Stenotrophomonas maltophilia NOT DETECTED NOT DETECTED   Candida albicans NOT DETECTED NOT DETECTED   Candida auris NOT DETECTED NOT DETECTED   Candida glabrata NOT DETECTED NOT DETECTED   Candida krusei NOT DETECTED NOT DETECTED   Candida parapsilosis NOT DETECTED NOT DETECTED   Candida tropicalis NOT DETECTED NOT DETECTED   Cryptococcus neoformans/gattii NOT DETECTED NOT DETECTED   Methicillin resistance mecA/C DETECTED (A) NOT DETECTED    Enrigue Harvard, PharmD, BCPS Please see amion for complete clinical pharmacist phone list 07/29/2023  7:56 PM

## 2023-07-29 NOTE — Progress Notes (Signed)
  Progress Note   Patient: Kristine Stafford MVH:[id number] DOB: 1950/11/17 DOA: 07/28/2023     0 DOS: the patient was seen and examined on 07/29/2023   Brief hospital course: 73 y.o. female with medical history significant of dementia, GERD, chronic pain syndrome, migraine headaches, thyroid disease, hyperlipidemia, who is from skilled nursing facility where her husband went today and found her to be altered.  Over the last 2 to 3 days she has been weaker than usual and less responsive.  She also has gradually worsening shaking episodes.  Patient apparently had UTI last week and was on antibiotics.  In the ER patient has continued rythmic movements but no overt seizure.  She has elevated CPK of over 5000.  She has no focal weakness   Assessment and Plan: #1 acute metabolic encephalopathy with tremors:  -initial shaking though to be seizures. Neurology was sidelined  -Neuro recs to hold off on statin and to follow CK trends -Per Neuro, OK to obtain routine EEG for screening. Will order -more confused this afternoon, possible sundowning in the setting of dementia   #2 Alzheimer's dementia:  -Pleasant during the day, however more confused and agitated towards end of day -Ordered PRN haldol. QTc reviewed, appropriate -Temporary soft restraints needed for pt safety, as she is trying to get out of bed and pull out lines   #3 GERD: Continue with PPIs   #4 mild rhabdomyolysis:  -Suspect from tremors -CK remains elevated, but is improving with hydration -Renal function normal -Cont IVF. Recheck CK and BMET in AM -no statin per Neuro recs   #5 hypothyroidism:  -Continue with levothyroxine.   #6 hyperlipidemia:  -no statin per Neurology recs   #7 chronic pain syndrome:  -Continue chronic pain management.      Subjective: Pleasantly confused this AM when seen  Physical Exam: Vitals:   07/29/23 0100 07/29/23 0205 07/29/23 0432 07/29/23 0828  BP: 99/84 (!) 109/54 100/69 (!) 110/53   Pulse: (!) 103 100 99 91  Resp: 18 18  17   Temp: 98.1 F (36.7 C) 98.6 F (37 C) 99.1 F (37.3 C) 98.4 F (36.9 C)  TempSrc: Oral Axillary Oral Oral  SpO2: 100% 96% 98%   Weight:      Height:       General exam: Awake, laying in bed, in nad Respiratory system: Normal respiratory effort, no wheezing Cardiovascular system: regular rate, s1, s2 Gastrointestinal system: Soft, nondistended, positive BS Central nervous system: CN2-12 grossly intact, strength intact Extremities: Perfused, no clubbing Skin: Normal skin turgor, no notable skin lesions seen Psychiatry: Mood normal // affect seems to be normal  Data Reviewed:  Labs reviewed: Na 139, K 3.7, Cr 0.60, WBC 8.5, hgb 11.2, Plts 221  Family Communication: Pt in room, family not at bedside  Disposition: Status is: Observation The patient will require care spanning > 2 midnights and should be moved to inpatient because: severity of illness  Planned Discharge Destination: Home    Author: Cherylle Corwin, MD 07/29/2023 5:02 PM  For on call review [email or web address].

## 2023-07-30 DIAGNOSIS — R569 Unspecified convulsions: Secondary | ICD-10-CM | POA: Diagnosis not present

## 2023-07-30 DIAGNOSIS — R7881 Bacteremia: Secondary | ICD-10-CM

## 2023-07-30 DIAGNOSIS — G9341 Metabolic encephalopathy: Secondary | ICD-10-CM

## 2023-07-30 DIAGNOSIS — R4182 Altered mental status, unspecified: Secondary | ICD-10-CM

## 2023-07-30 DIAGNOSIS — M6282 Rhabdomyolysis: Secondary | ICD-10-CM | POA: Diagnosis not present

## 2023-07-30 LAB — CBC
HCT: 36.6 % (ref 36.0–46.0)
Hemoglobin: 11.4 g/dL — ABNORMAL LOW (ref 12.0–15.0)
MCH: 28.6 pg (ref 26.0–34.0)
MCHC: 31.1 g/dL (ref 30.0–36.0)
MCV: 91.7 fL (ref 80.0–100.0)
Platelets: 203 10*3/uL (ref 150–400)
RBC: 3.99 MIL/uL (ref 3.87–5.11)
RDW: 14.6 % (ref 11.5–15.5)
WBC: 6.3 10*3/uL (ref 4.0–10.5)
nRBC: 0 % (ref 0.0–0.2)

## 2023-07-30 LAB — COMPREHENSIVE METABOLIC PANEL WITH GFR
ALT: 20 U/L (ref 0–44)
AST: 36 U/L (ref 15–41)
Albumin: 2.6 g/dL — ABNORMAL LOW (ref 3.5–5.0)
Alkaline Phosphatase: 37 U/L — ABNORMAL LOW (ref 38–126)
Anion gap: 10 (ref 5–15)
BUN: 8 mg/dL (ref 8–23)
CO2: 22 mmol/L (ref 22–32)
Calcium: 8.5 mg/dL — ABNORMAL LOW (ref 8.9–10.3)
Chloride: 108 mmol/L (ref 98–111)
Creatinine, Ser: 0.61 mg/dL (ref 0.44–1.00)
GFR, Estimated: >60 mL/min (ref >60)
Glucose, Bld: 99 mg/dL (ref 70–99)
Potassium: 3.6 mmol/L (ref 3.5–5.1)
Sodium: 140 mmol/L (ref 135–145)
Total Bilirubin: 0.7 mg/dL (ref 0.0–1.2)
Total Protein: 5.4 g/dL — ABNORMAL LOW (ref 6.5–8.1)

## 2023-07-30 LAB — CULTURE, BLOOD (ROUTINE X 2): Special Requests: ADEQUATE

## 2023-07-30 LAB — CK: Total CK: 1031 U/L — ABNORMAL HIGH (ref 38–234)

## 2023-07-30 LAB — MAGNESIUM: Magnesium: 2 mg/dL (ref 1.7–2.4)

## 2023-07-30 MED ORDER — SODIUM CHLORIDE 0.9% FLUSH: 10.0000 mL | INTRAVENOUS | Status: DC | PRN

## 2023-07-30 MED ORDER — SODIUM CHLORIDE 0.9% FLUSH
10.0000 mL | Freq: Two times a day (BID) | INTRAVENOUS | Status: DC
  Administered 2023-07-30 – 2023-08-01 (×2): 10 mL

## 2023-07-30 NOTE — Progress Notes (Addendum)
 [facility] PROGRESS NOTE   Kristine Stafford ZOX:[id number] DOB: Jan 06, 1951 DOA: 07/28/2023  PCP: Sun, Vyvyan, MD  Brief History: 73 y.o. female with medical history significant of dementia, GERD, chronic pain syndrome, migraine headaches, thyroid disease, hyperlipidemia, who is from skilled nursing facility where she was found to be altered.  Apparently she had been getting weaker than usual over the last few days prior to admission.  Was on antibiotics for UTI the previous week.  She was noted to be quite tremulous in the emergency department.  She was hospitalized for further management.   Consultants: None  Procedures: EEG    Subjective/Interval History: Patient noted to be distracted.  Her significant other is at the bedside.  He mentions that he was the one who found her quite altered in the skilled nursing facility.  He mentioned that if she seems to be doing much better compared to that.  Tremors have improved.   Assessment/Plan:  Acute metabolic encephalopathy with tremors:  Patient was noted to be quite tremulous.  Etiology unclear.  CT head was unremarkable.  EEG is unremarkable. Will review medication list to see if there is any new medications started on recently. No clear indication for antiepileptics. All of this is in the setting of dementia. UA was noted to be unremarkable.  No other source of infection identified. Home medication list reviewed.  She is on multiple psychotropics including buspirone, Depakote, duloxetine, pregabalin.  It appears that most of these medications were initiated at least a month ago.  Duloxetine and pregabalin may have been initiated earlier this month.  Pregabalin can cause rhabdomyolysis.  Will hold this medication.  Duloxetine can cause extrapyramidal symptoms.  Will hold this medicine as well. Check ammonia level and Depakote level.  Holding Depakote and buspirone as well.  Bacteremia 1 set of blood cultures positive for Staph  epidermidis.  Most likely a contaminant.  Rhabdomyolysis Etiology unclear.  Could be multifactorial secondary to tremors in the setting of dehydration.  CK level was elevated at 4297.  Improved with IV hydration.  Renal function is normal.  Alzheimer's dementia:  -Pleasant during the day, however more confused and agitated towards end of day -Ordered PRN haldol. QTc reviewed, appropriate To be restrained.  Discussed with nursing staff who will try to take off her restraints today. Noted to be on donepezil  and memantine .  GERD: Continue with PPIs   Hypothyroidism:  -Continue with levothyroxine.   Hyperlipidemia:  Hold statin for now due to rhabdomyolysis   Chronic pain syndrome:  -Continue chronic pain management. Noted to be on Lyrica.  DVT Prophylaxis: Lovenox Code Status: Full code Family Communication: Discussed with patient significant other was at the bedside Disposition Plan: SNF when medically improved     Medications: Scheduled:  donepezil   10 mg Oral QHS   enoxaparin (LOVENOX) injection  40 mg Subcutaneous Q24H   memantine   10 mg Oral BID   pregabalin  75 mg Oral BID   Continuous:  lactated ringers  100 mL/hr at 07/30/23 0334   WJX:[id number] lactate, ondansetron  **OR** ondansetron  (ZOFRAN ) IV  Antibiotics: Anti-infectives (From admission, onward)    None       Objective:  Vital Signs  Vitals:   07/29/23 1824 07/29/23 1946 07/30/23 0452 07/30/23 0826  BP: (!) 101/39 (!) 107/48 (!) 119/55 (!) 117/59  Pulse: 95 92 90 97  Resp: 17 18 18 17   Temp: 99.1 F (37.3 C) 100.1 F (37.8 C) 98.7 F (37.1 C) 98.6 F (37 C)  TempSrc: Oral Oral Oral Oral  SpO2:  97% 97% 97%  Weight:      Height:        Intake/Output Summary (Last 24 hours) at 07/30/2023 0952 Last data filed at 07/30/2023 0452 Gross per 24 hour  Intake 1042.76 ml  Output 900 ml  Net 142.76 ml   Filed Weights   07/28/23 1635  Weight: 67.2 kg    General appearance: Awake alert.   In no distress.  Distracted Resp: Clear to auscultation bilaterally.  Normal effort Cardio: S1-S2 is normal regular.  No S3-S4.  No rubs murmurs or bruit GI: Abdomen is soft.  Nontender nondistended.  Bowel sounds are present normal.  No masses organomegaly Extremities: No edema.  Full range of motion of lower extremities. Neurologic: Noted to be disoriented. no focal neurological deficits.    Lab Results:  Data Reviewed: I have personally reviewed following labs and reports of the imaging studies  CBC: Recent Labs  Lab 07/28/23 1722 07/29/23 0818 07/30/23 0619  WBC 12.1* 8.5 6.3  NEUTROABS 9.0*  --   --   HGB 12.1 11.2* 11.4*  HCT 38.5 34.9* 36.6  MCV 92.3 90.9 91.7  PLT 283 221 203    Basic Metabolic Panel: Recent Labs  Lab 07/28/23 1722 07/29/23 0818 07/30/23 0619  NA 141 139 140  K 4.0 3.7 3.6  CL 107 107 108  CO2 28 24 22   GLUCOSE 122* 109* 99  BUN 26* 17 8  CREATININE 0.72 0.60 0.61  CALCIUM 8.7* 8.7* 8.5*  MG  --   --  2.0    GFR: Estimated Creatinine Clearance: 60 mL/min (by C-G formula based on SCr of 0.61 mg/dL).  Liver Function Tests: Recent Labs  Lab 07/28/23 1722 07/29/23 0818 07/30/23 0619  AST 67* 49* 36  ALT 23 22 20   ALKPHOS 45 38 37*  BILITOT 0.7 0.5 0.7  PROT 6.4* 5.9* 5.4*  ALBUMIN 3.4* 3.0* 2.6*    Coagulation Profile: Recent Labs  Lab 07/28/23 1749  INR 1.1    Cardiac Enzymes: Recent Labs  Lab 07/28/23 1722 07/29/23 0818 07/30/23 0619  CKTOTAL 4,297* 2,439* 1,031*    Recent Results (from the past 240 hours)  Blood Culture (routine x 2)     Status: Abnormal   Collection Time: 07/28/23  5:49 PM   Specimen: BLOOD  Result Value Ref Range Status   Specimen Description BLOOD SITE NOT SPECIFIED  Final   Special Requests   Final    BOTTLES DRAWN AEROBIC AND ANAEROBIC Blood Culture adequate volume   Culture  Setup Time   Final    GRAM POSITIVE COCCI IN PAIRS AEROBIC BOTTLE ONLY CRITICAL RESULT CALLED TO, READ BACK BY  AND VERIFIED WITH: PHARMD CAREN AMEND ON 07/29/23 @ 1921 BY DRT    Culture (A)  Final    STAPHYLOCOCCUS EPIDERMIDIS THE SIGNIFICANCE OF ISOLATING THIS ORGANISM FROM A SINGLE SET OF BLOOD CULTURES WHEN MULTIPLE SETS ARE DRAWN IS UNCERTAIN. PLEASE NOTIFY THE MICROBIOLOGY DEPARTMENT WITHIN ONE WEEK IF SPECIATION AND SENSITIVITIES ARE REQUIRED. Performed at [facility], [address], [place], [place] [address]    Report Status 07/30/2023 FINAL  Final  Blood Culture (routine x 2)     Status: None (Preliminary result)   Collection Time: 07/28/23  5:49 PM   Specimen: BLOOD  Result Value Ref Range Status   Specimen Description BLOOD SITE NOT SPECIFIED  Final   Special Requests   Final    BOTTLES DRAWN AEROBIC  AND ANAEROBIC Blood Culture adequate volume   Culture   Final    NO GROWTH 2 DAYS Performed at [facility] [place] [facility], [address], [place], [place] [address]    Report Status PENDING  Incomplete  Blood Culture ID Panel (Reflexed)     Status: Abnormal   Collection Time: 07/28/23  5:49 PM  Result Value Ref Range Status   Enterococcus faecalis NOT DETECTED NOT DETECTED Final   Enterococcus Faecium NOT DETECTED NOT DETECTED Final   Listeria monocytogenes NOT DETECTED NOT DETECTED Final   Staphylococcus species DETECTED (A) NOT DETECTED Final    Comment: CRITICAL RESULT CALLED TO, READ BACK BY AND VERIFIED WITH: PHARMD CAREN AMEND ON 07/29/23 @ 1921 BY DRT    Staphylococcus aureus (BCID) NOT DETECTED NOT DETECTED Final   Staphylococcus epidermidis DETECTED (A) NOT DETECTED Final    Comment: Methicillin (oxacillin) resistant coagulase negative staphylococcus. Possible blood culture contaminant (unless isolated from more than one blood culture draw or clinical case suggests pathogenicity). No antibiotic treatment is indicated for blood  culture contaminants. CRITICAL RESULT CALLED TO, READ BACK BY AND VERIFIED WITH: PHARMD CAREN AMEND ON 07/29/23 @ 1921 BY DRT    Staphylococcus  lugdunensis NOT DETECTED NOT DETECTED Final   Streptococcus species NOT DETECTED NOT DETECTED Final   Streptococcus agalactiae NOT DETECTED NOT DETECTED Final   Streptococcus pneumoniae NOT DETECTED NOT DETECTED Final   Streptococcus pyogenes NOT DETECTED NOT DETECTED Final   A.calcoaceticus-baumannii NOT DETECTED NOT DETECTED Final   Bacteroides fragilis NOT DETECTED NOT DETECTED Final   Enterobacterales NOT DETECTED NOT DETECTED Final   Enterobacter cloacae complex NOT DETECTED NOT DETECTED Final   Escherichia coli NOT DETECTED NOT DETECTED Final   Klebsiella aerogenes NOT DETECTED NOT DETECTED Final   Klebsiella oxytoca NOT DETECTED NOT DETECTED Final   Klebsiella pneumoniae NOT DETECTED NOT DETECTED Final   Proteus species NOT DETECTED NOT DETECTED Final   Salmonella species NOT DETECTED NOT DETECTED Final   Serratia marcescens NOT DETECTED NOT DETECTED Final   Haemophilus influenzae NOT DETECTED NOT DETECTED Final   Neisseria meningitidis NOT DETECTED NOT DETECTED Final   Pseudomonas aeruginosa NOT DETECTED NOT DETECTED Final   Stenotrophomonas maltophilia NOT DETECTED NOT DETECTED Final   Candida albicans NOT DETECTED NOT DETECTED Final   Candida auris NOT DETECTED NOT DETECTED Final   Candida glabrata NOT DETECTED NOT DETECTED Final   Candida krusei NOT DETECTED NOT DETECTED Final   Candida parapsilosis NOT DETECTED NOT DETECTED Final   Candida tropicalis NOT DETECTED NOT DETECTED Final   Cryptococcus neoformans/gattii NOT DETECTED NOT DETECTED Final   Methicillin resistance mecA/C DETECTED (A) NOT DETECTED Final    Comment: CRITICAL RESULT CALLED TO, READ BACK BY AND VERIFIED WITH: PHARMD CAREN AMEND ON 07/29/23 @ 1921 BY DRT Performed at [facility], [address] [place], [place] [address]       Radiology Studies: EEG adult Result Date: 07/30/2023 Arleene Lack, MD     07/30/2023  9:25 AM Patient Name: Kristine Stafford MRN: [id number] Epilepsy Attending:  Arleene Lack Referring Physician/Provider: Oral Billings, MD Date: 07/29/2023 Duration: 26.02 mins Patient history: 73yo F with ams and seizure like activity. EEG to evaluate for seizure. Level of alertness: Awake AEDs during EEG study: PGB Technical aspects: This EEG study was done with scalp electrodes positioned according to the 10-20 International system of electrode placement. Electrical activity was reviewed with band pass filter of 1-70Hz , sensitivity of 7 uV/mm, display  speed of 36mm/sec with a 60Hz  notched filter applied as appropriate. EEG data were recorded continuously and digitally stored.  Video monitoring was available and reviewed as appropriate. Description: EEG showed continuous generalized 3 to 5 Hz theta-delta slowing. Physiologic photic driving was not seen during photic stimulation.  Hyperventilation was not performed.   ABNORMALITY - Continuous slow, generalized IMPRESSION: This study is suggestive of moderate diffuse encephalopathy. No seizures or epileptiform discharges were seen throughout the recording. Arleene Lack   CT Head Wo Contrast Result Date: 07/28/2023 CLINICAL DATA:  Altered mental status. EXAM: CT HEAD WITHOUT CONTRAST TECHNIQUE: Contiguous axial images were obtained from the base of the skull through the vertex without intravenous contrast. RADIATION DOSE REDUCTION: This exam was performed according to the departmental dose-optimization program which includes automated exposure control, adjustment of the mA and/or kV according to patient size and/or use of iterative reconstruction technique. COMPARISON:  April 27, 2023 FINDINGS: Brain: There is generalized cerebral atrophy with widening of the extra-axial spaces and ventricular dilatation. There are areas of decreased attenuation within the white matter tracts of the supratentorial brain, consistent with microvascular disease changes. Vascular: Marked severity bilateral cavernous carotid artery calcification is  noted. Skull: Negative for an acute fracture. Sinuses/Orbits: Moderate severity sphenoid sinus mucosal thickening is seen. Other: None. IMPRESSION: 1. Generalized cerebral atrophy and microvascular disease changes of the supratentorial brain. 2. No acute intracranial abnormality. 3. Moderate severity sphenoid sinus disease. Electronically Signed   By: Virgle Grime M.D.   On: 07/28/2023 22:47   DG Chest Port 1 View Result Date: 07/28/2023 CLINICAL DATA:  Questionable sepsis - evaluate for abnormality , increased fatigue EXAM: PORTABLE CHEST - 1 VIEW COMPARISON:  April 27, 2023 FINDINGS: Lower lung volumes. No focal airspace consolidation, pleural effusion, or pneumothorax. Streaky atelectasis of the left lung base. The cardiac silhouette is at the upper limits of normal, likely accentuated by AP technique and low lung volumes. Tortuous aorta with aortic atherosclerosis. No acute fracture or destructive lesions. Multilevel thoracic osteophytosis. IMPRESSION: No acute cardiopulmonary abnormality. Electronically Signed   By: Rance Burrows M.D.   On: 07/28/2023 17:50       LOS: 1 day   [employer]  [facility] Pager on [email or web address]  07/30/2023, 9:52 AM

## 2023-07-30 NOTE — Plan of Care (Signed)
  Problem: Pain Managment: Goal: General experience of comfort will improve and/or be controlled Outcome: Progressing   Problem: Safety: Goal: Non-violent Restraint(s) Outcome: Progressing

## 2023-07-30 NOTE — Progress Notes (Deleted)

## 2023-07-30 NOTE — Procedures (Signed)
 Patient Name: Kristine Stafford  MRN: [id number]  Epilepsy Attending: Arleene Lack  Referring Physician/Provider: Oral Billings, MD Date: 07/29/2023 Duration: 26.02 mins  Patient history: 73yo F with ams and seizure like activity. EEG to evaluate for seizure.  Level of alertness: Awake  AEDs during EEG study: PGB  Technical aspects: This EEG study was done with scalp electrodes positioned according to the 10-20 International system of electrode placement. Electrical activity was reviewed with band pass filter of 1-70Hz , sensitivity of 7 uV/mm, display speed of 8mm/sec with a 60Hz  notched filter applied as appropriate. EEG data were recorded continuously and digitally stored.  Video monitoring was available and reviewed as appropriate.  Description: EEG showed continuous generalized 3 to 5 Hz theta-delta slowing. Physiologic photic driving was not seen during photic stimulation.  Hyperventilation was not performed.     ABNORMALITY - Continuous slow, generalized  IMPRESSION: This study is suggestive of moderate diffuse encephalopathy. No seizures or epileptiform discharges were seen throughout the recording.  Bruin Bolger O Camdynn Maranto

## 2023-07-30 NOTE — TOC Progression Note (Signed)
 Transition of Care [facility]) - Progression Note    Patient Details  Name: Kristine Stafford MRN: [id number] Date of Birth: 1950-06-07  [facility]) CM/SW Contact  Katrinka Parr, [place] Phone Number: 07/30/2023, 1:36 PM  Clinical Narrative:     CSW spoke with Deadra Everts at [facility] about pt returning at [facility]. [facility] to review therapy notes. CSW faxed clinicals to [phone].          Social Determinants of Health (SDOH) Interventions SDOH Screenings   Food Insecurity: No Food Insecurity (07/29/2023)  Housing: Low Risk  (07/29/2023)  Transportation Needs: No Transportation Needs (07/29/2023)  Utilities: Not At Risk (07/29/2023)  Social Connections: Unknown (07/29/2023)  Tobacco Use: Low Risk  (07/28/2023)    Readmission Risk Interventions     No data to display

## 2023-07-31 DIAGNOSIS — R7881 Bacteremia: Secondary | ICD-10-CM | POA: Diagnosis not present

## 2023-07-31 DIAGNOSIS — G9341 Metabolic encephalopathy: Secondary | ICD-10-CM | POA: Diagnosis not present

## 2023-07-31 DIAGNOSIS — M6282 Rhabdomyolysis: Secondary | ICD-10-CM | POA: Diagnosis not present

## 2023-07-31 LAB — COMPREHENSIVE METABOLIC PANEL WITH GFR
ALT: 19 U/L (ref 0–44)
AST: 35 U/L (ref 15–41)
Albumin: 2.9 g/dL — ABNORMAL LOW (ref 3.5–5.0)
Alkaline Phosphatase: 46 U/L (ref 38–126)
Anion gap: 15 (ref 5–15)
BUN: 13 mg/dL (ref 8–23)
CO2: 21 mmol/L — ABNORMAL LOW (ref 22–32)
Calcium: 9 mg/dL (ref 8.9–10.3)
Chloride: 103 mmol/L (ref 98–111)
Creatinine, Ser: 0.63 mg/dL (ref 0.44–1.00)
GFR, Estimated: >60 mL/min (ref >60)
Glucose, Bld: 104 mg/dL — ABNORMAL HIGH (ref 70–99)
Potassium: 4.4 mmol/L (ref 3.5–5.1)
Sodium: 139 mmol/L (ref 135–145)
Total Bilirubin: 1.1 mg/dL (ref 0.0–1.2)
Total Protein: 6.3 g/dL — ABNORMAL LOW (ref 6.5–8.1)

## 2023-07-31 LAB — CBC
HCT: 40.2 % (ref 36.0–46.0)
Hemoglobin: 13.2 g/dL (ref 12.0–15.0)
MCH: 29.1 pg (ref 26.0–34.0)
MCHC: 32.8 g/dL (ref 30.0–36.0)
MCV: 88.7 fL (ref 80.0–100.0)
Platelets: 284 10*3/uL (ref 150–400)
RBC: 4.53 MIL/uL (ref 3.87–5.11)
RDW: 14 % (ref 11.5–15.5)
WBC: 7.5 10*3/uL (ref 4.0–10.5)
nRBC: 0 % (ref 0.0–0.2)

## 2023-07-31 LAB — VALPROIC ACID LEVEL: Valproic Acid Lvl: <10 ug/mL — ABNORMAL LOW (ref 50–100)

## 2023-07-31 LAB — MAGNESIUM: Magnesium: 2 mg/dL (ref 1.7–2.4)

## 2023-07-31 MED ORDER — SODIUM CHLORIDE 0.45 % IV SOLN: INTRAVENOUS | Status: AC

## 2023-07-31 MED ORDER — PANTOPRAZOLE SODIUM 40 MG PO TBEC
40.0000 mg | DELAYED_RELEASE_TABLET | Freq: Every day | ORAL | Status: DC
  Administered 2023-08-01: 40 mg via ORAL
  Filled 2023-07-31 (×2): qty 1

## 2023-07-31 MED ORDER — SODIUM CHLORIDE 0.9% FLUSH: 10.0000 mL | INTRAVENOUS | Status: DC | PRN

## 2023-07-31 MED ORDER — DIVALPROEX SODIUM 125 MG PO CSDR
125.0000 mg | DELAYED_RELEASE_CAPSULE | Freq: Two times a day (BID) | ORAL | Status: DC
  Administered 2023-08-01: 125 mg via ORAL
  Filled 2023-07-31 (×3): qty 1

## 2023-07-31 NOTE — Progress Notes (Signed)

## 2023-07-31 NOTE — Progress Notes (Addendum)
 [facility] PROGRESS NOTE   Kristine Stafford ZOX:[id number] DOB: Jun 02, 1950 DOA: 07/28/2023  PCP: Sun, Vyvyan, MD  Brief History: 73 y.o. female with medical history significant of dementia, GERD, chronic pain syndrome, migraine headaches, thyroid disease, hyperlipidemia, who is from skilled nursing facility where she was found to be altered.  Apparently she had been getting weaker than usual over the last few days prior to admission.  Was on antibiotics for UTI the previous week.  She was noted to be quite tremulous in the emergency department.  She was hospitalized for further management.   Consultants: None  Procedures: EEG  Subjective/Interval History: Patient noted to have mittens on today.  Apparently caught quite confused and agitated.  Likely sundowning.  Her significant other is at the bedside.  Patient is slow to respond to questions but is noted to be fully awake.  No significant tremors noted in the last 48 hours.   Assessment/Plan:  Acute metabolic encephalopathy with tremors:  Patient was noted to be quite tremulous.  Etiology unclear.  CT head was unremarkable.  EEG is unremarkable. All of this is in the setting of dementia. UA was noted to be unremarkable.  No other source of infection identified. Home medication list reviewed.  She is on multiple psychotropics including buspirone, Depakote, duloxetine, pregabalin.  It appears that most of these medications were initiated at least a month ago.  Duloxetine and pregabalin may have been initiated earlier this month.  Pregabalin can cause rhabdomyolysis.  Will hold this medication.  Duloxetine can cause extrapyramidal symptoms.  Will hold this medicine as well. Tremors have improved.  Depakote level was less than 10. Will resume Depakote for now.  If she tolerates it well then we will resume buspirone tomorrow.  Continue to hold duloxetine and pregabalin.  Bacteremia Identified only in the aerobic bottle.  Staph  epidermidis.  Most likely a contaminant.  Rhabdomyolysis Etiology unclear.  Could be multifactorial secondary to tremors in the setting of dehydration.  CK level was elevated at 4297.  Improvement noted to 1031 with IV hydration.  Renal function is normal.  Alzheimer's dementia:  -Pleasant during the day, however more confused and agitated towards end of day -Ordered PRN haldol. QTc reviewed, appropriate Restraints removed yesterday but mittens send to be placed yesterday evening. Noted to be on donepezil  and memantine .  GERD: Continue with PPIs   Hypothyroidism:  -Continue with levothyroxine.   Hyperlipidemia:  Holding statin for now due to rhabdomyolysis   Chronic pain syndrome:  -Continue chronic pain management. Noted to be on Lyrica.  DVT Prophylaxis: Lovenox Code Status: Noted Golden DNR in shadow chart. Confirmed with significant other. Will change to DNR. Family Communication: Discussed with patient significant other was at the bedside Disposition Plan: SNF when medically improved   Medications: Scheduled:  donepezil   10 mg Oral QHS   enoxaparin (LOVENOX) injection  40 mg Subcutaneous Q24H   memantine   10 mg Oral BID   sodium chloride  flush  10-40 mL Intracatheter Q12H   Continuous:  sodium chloride      WJX:BJYNWGNFAOZ lactate, ondansetron  **OR** ondansetron  (ZOFRAN ) IV, sodium chloride  flush   Objective:  Vital Signs  Vitals:   07/30/23 1947 07/31/23 0525 07/31/23 0548 07/31/23 0843  BP: 118/72 (!) 147/85  (!) 136/100  Pulse: 91 (!) 115 (!) 110   Resp: 16 17  18   Temp: 98.2 F (36.8 C) (!) 97.2 F (36.2 C)  98.9 F (37.2 C)  TempSrc: Oral Oral  Oral  SpO2:  95%   97%  Weight:      Height:        Intake/Output Summary (Last 24 hours) at 07/31/2023 0942 Last data filed at 07/31/2023 0500 Gross per 24 hour  Intake --  Output 300 ml  Net -300 ml   Filed Weights   07/28/23 1635  Weight: 67.2 kg   General appearance: Awake alert.  In no distress.   Distracted Resp: Clear to auscultation bilaterally.  Normal effort Cardio: S1-S2 is normal regular.  No S3-S4.  No rubs murmurs or bruit GI: Abdomen is soft.  Nontender nondistended.  Bowel sounds are present normal.  No masses organomegaly Extremities: No edema.  Full range of motion of lower extremities. Neurologic: Disoriented..  No focal neurological deficits.    Lab Results:  Data Reviewed: I have personally reviewed following labs and reports of the imaging studies  CBC: Recent Labs  Lab 07/28/23 1722 07/29/23 0818 07/30/23 0619 07/31/23 0629  WBC 12.1* 8.5 6.3 7.5  NEUTROABS 9.0*  --   --   --   HGB 12.1 11.2* 11.4* 13.2  HCT 38.5 34.9* 36.6 40.2  MCV 92.3 90.9 91.7 88.7  PLT 283 221 203 284    Basic Metabolic Panel: Recent Labs  Lab 07/28/23 1722 07/29/23 0818 07/30/23 0619 07/31/23 0629  NA 141 139 140 139  K 4.0 3.7 3.6 4.4  CL 107 107 108 103  CO2 28 24 22  21*  GLUCOSE 122* 109* 99 104*  BUN 26* 17 8 13   CREATININE 0.72 0.60 0.61 0.63  CALCIUM 8.7* 8.7* 8.5* 9.0  MG  --   --  2.0 2.0    GFR: Estimated Creatinine Clearance: 60 mL/min (by C-G formula based on SCr of 0.63 mg/dL).  Liver Function Tests: Recent Labs  Lab 07/28/23 1722 07/29/23 0818 07/30/23 0619 07/31/23 0629  AST 67* 49* 36 35  ALT 23 22 20 19   ALKPHOS 45 38 37* 46  BILITOT 0.7 0.5 0.7 1.1  PROT 6.4* 5.9* 5.4* 6.3*  ALBUMIN 3.4* 3.0* 2.6* 2.9*    Coagulation Profile: Recent Labs  Lab 07/28/23 1749  INR 1.1    Cardiac Enzymes: Recent Labs  Lab 07/28/23 1722 07/29/23 0818 07/30/23 0619  CKTOTAL 4,297* 2,439* 1,031*    Recent Results (from the past 240 hours)  Blood Culture (routine x 2)     Status: Abnormal   Collection Time: 07/28/23  5:49 PM   Specimen: BLOOD  Result Value Ref Range Status   Specimen Description BLOOD SITE NOT SPECIFIED  Final   Special Requests   Final    BOTTLES DRAWN AEROBIC AND ANAEROBIC Blood Culture adequate volume   Culture  Setup  Time   Final    GRAM POSITIVE COCCI IN PAIRS AEROBIC BOTTLE ONLY CRITICAL RESULT CALLED TO, READ BACK BY AND VERIFIED WITH: PHARMD CAREN AMEND ON 07/29/23 @ 1921 BY DRT    Culture (A)  Final    STAPHYLOCOCCUS EPIDERMIDIS THE SIGNIFICANCE OF ISOLATING THIS ORGANISM FROM A SINGLE SET OF BLOOD CULTURES WHEN MULTIPLE SETS ARE DRAWN IS UNCERTAIN. PLEASE NOTIFY THE MICROBIOLOGY DEPARTMENT WITHIN ONE WEEK IF SPECIATION AND SENSITIVITIES ARE REQUIRED. Performed at [facility], [address] [place], [place] [address]    Report Status 07/30/2023 FINAL  Final  Blood Culture (routine x 2)     Status: None (Preliminary result)   Collection Time: 07/28/23  5:49 PM   Specimen: BLOOD  Result Value Ref Range Status   Specimen Description BLOOD  SITE NOT SPECIFIED  Final   Special Requests   Final    BOTTLES DRAWN AEROBIC AND ANAEROBIC Blood Culture adequate volume   Culture   Final    NO GROWTH 3 DAYS Performed at [facility], [address] [place], [place] [address]    Report Status PENDING  Incomplete  Blood Culture ID Panel (Reflexed)     Status: Abnormal   Collection Time: 07/28/23  5:49 PM  Result Value Ref Range Status   Enterococcus faecalis NOT DETECTED NOT DETECTED Final   Enterococcus Faecium NOT DETECTED NOT DETECTED Final   Listeria monocytogenes NOT DETECTED NOT DETECTED Final   Staphylococcus species DETECTED (A) NOT DETECTED Final    Comment: CRITICAL RESULT CALLED TO, READ BACK BY AND VERIFIED WITH: PHARMD CAREN AMEND ON 07/29/23 @ 1921 BY DRT    Staphylococcus aureus (BCID) NOT DETECTED NOT DETECTED Final   Staphylococcus epidermidis DETECTED (A) NOT DETECTED Final    Comment: Methicillin (oxacillin) resistant coagulase negative staphylococcus. Possible blood culture contaminant (unless isolated from more than one blood culture draw or clinical case suggests pathogenicity). No antibiotic treatment is indicated for blood  culture contaminants. CRITICAL RESULT  CALLED TO, READ BACK BY AND VERIFIED WITH: PHARMD CAREN AMEND ON 07/29/23 @ 1921 BY DRT    Staphylococcus lugdunensis NOT DETECTED NOT DETECTED Final   Streptococcus species NOT DETECTED NOT DETECTED Final   Streptococcus agalactiae NOT DETECTED NOT DETECTED Final   Streptococcus pneumoniae NOT DETECTED NOT DETECTED Final   Streptococcus pyogenes NOT DETECTED NOT DETECTED Final   A.calcoaceticus-baumannii NOT DETECTED NOT DETECTED Final   Bacteroides fragilis NOT DETECTED NOT DETECTED Final   Enterobacterales NOT DETECTED NOT DETECTED Final   Enterobacter cloacae complex NOT DETECTED NOT DETECTED Final   Escherichia coli NOT DETECTED NOT DETECTED Final   Klebsiella aerogenes NOT DETECTED NOT DETECTED Final   Klebsiella oxytoca NOT DETECTED NOT DETECTED Final   Klebsiella pneumoniae NOT DETECTED NOT DETECTED Final   Proteus species NOT DETECTED NOT DETECTED Final   Salmonella species NOT DETECTED NOT DETECTED Final   Serratia marcescens NOT DETECTED NOT DETECTED Final   Haemophilus influenzae NOT DETECTED NOT DETECTED Final   Neisseria meningitidis NOT DETECTED NOT DETECTED Final   Pseudomonas aeruginosa NOT DETECTED NOT DETECTED Final   Stenotrophomonas maltophilia NOT DETECTED NOT DETECTED Final   Candida albicans NOT DETECTED NOT DETECTED Final   Candida auris NOT DETECTED NOT DETECTED Final   Candida glabrata NOT DETECTED NOT DETECTED Final   Candida krusei NOT DETECTED NOT DETECTED Final   Candida parapsilosis NOT DETECTED NOT DETECTED Final   Candida tropicalis NOT DETECTED NOT DETECTED Final   Cryptococcus neoformans/gattii NOT DETECTED NOT DETECTED Final   Methicillin resistance mecA/C DETECTED (A) NOT DETECTED Final    Comment: CRITICAL RESULT CALLED TO, READ BACK BY AND VERIFIED WITH: PHARMD CAREN AMEND ON 07/29/23 @ 1921 BY DRT Performed at [facility], [address] [place], [place] [address]       Radiology Studies: EEG adult Result Date: 07/30/2023 Arleene Lack, MD     07/30/2023  9:25 AM Patient Name: Kristine Stafford MRN: [id number] Epilepsy Attending: Arleene Lack Referring Physician/Provider: Oral Billings, MD Date: 07/29/2023 Duration: 26.02 mins Patient history: 73yo F with ams and seizure like activity. EEG to evaluate for seizure. Level of alertness: Awake AEDs during EEG study: PGB Technical aspects: This EEG study was done with scalp electrodes positioned according to the 10-20 International system  of electrode placement. Electrical activity was reviewed with band pass filter of 1-70Hz , sensitivity of 7 uV/mm, display speed of 33mm/sec with a 60Hz  notched filter applied as appropriate. EEG data were recorded continuously and digitally stored.  Video monitoring was available and reviewed as appropriate. Description: EEG showed continuous generalized 3 to 5 Hz theta-delta slowing. Physiologic photic driving was not seen during photic stimulation.  Hyperventilation was not performed.   ABNORMALITY - Continuous slow, generalized IMPRESSION: This study is suggestive of moderate diffuse encephalopathy. No seizures or epileptiform discharges were seen throughout the recording. Priyanka Suzanne Erps       LOS: 2 days   Ronit Cranfield [employer] Pager on [email or web address]  07/31/2023, 9:42 AM

## 2023-07-31 NOTE — Progress Notes (Signed)
 IV in the left forearm has been removed d/t infiltration. This nurse pushed 5ml of NS when I noticed swelling at the IV site, No blood return when pulled back. IV site is clean, dry, and intact on removal. No complaints of pain from patient. Will attempt new IV.

## 2023-07-31 NOTE — Plan of Care (Signed)
  Problem: Skin Integrity: Goal: Risk for impaired skin integrity will decrease Outcome: Progressing   Problem: Safety: Goal: Non-violent Restraint(s) Outcome: Progressing

## 2023-07-31 NOTE — Progress Notes (Signed)
 Mews Turned yellow because of heart rate of 115 at 0525H. Re checked heart rate at 0548 and was 110. Mews is back to green.

## 2023-07-31 NOTE — Progress Notes (Signed)
 Patient requested that feeding tube dressing be changed. This nurse removed old dressing with a scant amount of yellow drainage noted. New split gauze applied. Patient tolerated well.

## 2023-07-31 NOTE — Evaluation (Signed)
 Clinical/Bedside Swallow Evaluation Patient Details  Name: Ryane Konieczny MRN: [id number] Date of Birth: Dec 06, 1950  Today's Date: 07/31/2023 Time: SLP Start Time (ACUTE ONLY): 1710 SLP Stop Time (ACUTE ONLY): 1730 SLP Time Calculation (min) (ACUTE ONLY): 20 min  Past Medical History:  Past Medical History:  Diagnosis Date   Abnormal Pap smear    Chronic pain syndrome    GERD (gastroesophageal reflux disease)    Heart murmur    pt and pt's sig other denies   Herpes simplex without mention of complication    HSV 2    Left hip pain    Major neurocognitive disorder due to Alzheimer's disease 01/15/2021   Migraine    Osteopenia    in neck   Osteoporosis    Pure hypercholesterolemia    PVC (premature ventricular contraction)    Thyroid disease    Past Surgical History:  Past Surgical History:  Procedure Laterality Date   APPENDECTOMY     FOOT SURGERY Left    LUMBAR LAMINECTOMY/DECOMPRESSION MICRODISCECTOMY N/A 09/12/2021   Procedure: LUMBAR 5- SACRUM 1 DECOMPRESSION;  Surgeon: Virl Grimes, MD;  Location: [facility] OR;  Service: Orthopedics;  Laterality: N/A;   WRIST FRACTURE SURGERY Left    metal in left wrist   HPI:  Patient is a 73 y.o. female with PMH: dementia, GERD, chronic pain syndrome, migraine headaches, thyroid disease, HLD. She lives in a memory care unit at [facility]. She presented to the hospital on 07/28/23 secondary to 2-3 days of increasing weakness and being less responsive and worse shaking episodes. She had been diagnosed with UTI previous week and was being treated with antibiotics. She was admitted with acute metabolic encephalopathy with tremors. CPK elevated and pt admitted for non-traumatic rhabdo. SLP ordered for swallow evaluation on 07/31/23.    Assessment / Plan / Recommendation  Clinical Impression  Patient is not currently presenting with clinical s/s of dysphagia as per this bedside swallow evaluation. Her spouse was in the room and he provided  all history related to patient and her swallowing/PO intake. He did indicate that she was seen by an SLP at her facility but that was a while ago. SLP noted an old order for OP MBS dated August 2024 that had not been scheduled. SLP assessed patient's swallow as her spouse fed her solids and liquids. She exhibited mildly prolonged mastication, and instances of brief oral holding of liquids, but no overt s/s aspiration. She did exhibit interest in PO's being fed to her. Spouse was pleased because he stated this is the most she has eaten in two days. At baseline, patient has a decent appetite and PO intake but lately she has been declining to allow staff to get her dressed and not wanting to leave her room. SLP not recommending further skilled intervention or assessment but advised spouse to request SLP if he has any future concerns for her swallow function and safety with PO intake. SLP Visit Diagnosis: Dysphagia, unspecified (R13.10)    Aspiration Risk  Mild aspiration risk    Diet Recommendation Regular;Thin liquid    Liquid Administration via: Cup;Straw Medication Administration: Other (Comment) (as tolerated) Supervision: Full supervision/cueing for compensatory strategies;Staff to assist with self feeding Compensations: Slow rate;Small sips/bites;Minimize environmental distractions Postural Changes: Seated upright at 90 degrees    Other  Recommendations Oral Care Recommendations: Oral care BID     Assistance Recommended at Discharge    Functional Status Assessment Patient has not had a recent decline in their functional status  Frequency and Duration     N/A       Prognosis   N/A     Swallow Study   General Date of Onset: 07/31/23 HPI: Patient is a 73 y.o. female with PMH: dementia, GERD, chronic pain syndrome, migraine headaches, thyroid disease, HLD. She lives in a memory care unit at [facility]. She presented to the hospital on 07/28/23 secondary to 2-3 days of increasing  weakness and being less responsive and worse shaking episodes. She had been diagnosed with UTI previous week and was being treated with antibiotics. She was admitted with acute metabolic encephalopathy with tremors. CPK elevated and pt admitted for non-traumatic rhabdo. SLP ordered for swallow evaluation on 07/31/23. Type of Study: Bedside Swallow Evaluation Previous Swallow Assessment: none found Diet Prior to this Study: Regular;Thin liquids (Level 0) Temperature Spikes Noted: No Respiratory Status: Room air History of Recent Intubation: No Behavior/Cognition: Cooperative;Alert Oral Cavity Assessment: Within Functional Limits Oral Care Completed by SLP: No Oral Cavity - Dentition: Adequate natural dentition Self-Feeding Abilities: Total assist Patient Positioning: Upright in bed Baseline Vocal Quality: Low vocal intensity;Other (comment) (limited verbalizations and vocalizations) Volitional Cough: Cognitively unable to elicit Volitional Swallow: Unable to elicit    Oral/Motor/Sensory Function Overall Oral Motor/Sensory Function: Other (comment) (appears WFL)   Ice Chips     Thin Liquid Thin Liquid: Within functional limits Presentation: Straw    Nectar Thick     Honey Thick     Puree Puree: Not tested   Solid     Solid: Within functional limits Other Comments: mildly prolonged mastication     Jacqualine Mater, MA, CCC-SLP Speech Therapy

## 2023-07-31 NOTE — TOC Progression Note (Addendum)
 Transition of Care [facility]) - Progression Note    Patient Details  Name: Kristine Stafford MRN: [id number] Date of Birth: [birth year]  [facility] CM/SW Contact  Steven Basso [place], [place] Phone Number: 07/31/2023, 2:59 PM  Clinical Narrative:       CSW spoke with Deadra Everts at [facility], Deadra Everts, to confirm they received clinicals and that pt could return with [employer] on DC. She has not reviewed clinicals yet. She will review clinicals and call CSW back.    1515: [place] CSW back and confirmed pt can return. She wanted clarification if pt had staph infection. CSW explained it is documented as likely contamination.            Social Determinants of Health (SDOH) Interventions SDOH Screenings   Food Insecurity: No Food Insecurity (07/29/2023)  Housing: Low Risk  (07/29/2023)  Transportation Needs: No Transportation Needs (07/29/2023)  Utilities: Not At Risk (07/29/2023)  Social Connections: Unknown (07/29/2023)  Tobacco Use: Low Risk  (07/28/2023)    Readmission Risk Interventions     No data to display

## 2023-07-31 NOTE — Plan of Care (Signed)
  Problem: Pain Managment: Goal: General experience of comfort will improve and/or be controlled Outcome: Progressing   Problem: Skin Integrity: Goal: Risk for impaired skin integrity will decrease Outcome: Progressing   Problem: Activity: Goal: Risk for activity intolerance will decrease Outcome: Not Progressing   Problem: Coping: Goal: Level of anxiety will decrease Outcome: Not Progressing

## 2023-08-01 DIAGNOSIS — G9340 Encephalopathy, unspecified: Secondary | ICD-10-CM

## 2023-08-01 LAB — BASIC METABOLIC PANEL WITH GFR
Anion gap: 11 (ref 5–15)
BUN: 15 mg/dL (ref 8–23)
CO2: 22 mmol/L (ref 22–32)
Calcium: 8.6 mg/dL — ABNORMAL LOW (ref 8.9–10.3)
Chloride: 103 mmol/L (ref 98–111)
Creatinine, Ser: 0.59 mg/dL (ref 0.44–1.00)
GFR, Estimated: >60 mL/min (ref >60)
Glucose, Bld: 128 mg/dL — ABNORMAL HIGH (ref 70–99)
Potassium: 3.7 mmol/L (ref 3.5–5.1)
Sodium: 136 mmol/L (ref 135–145)

## 2023-08-01 LAB — AMMONIA: Ammonia: 17 umol/L (ref 9–35)

## 2023-08-01 LAB — CK: Total CK: 587 U/L — ABNORMAL HIGH (ref 38–234)

## 2023-08-01 MED ORDER — ACETAMINOPHEN 325 MG PO TABS: 650.0000 mg | ORAL_TABLET | Freq: Three times a day (TID) | ORAL | Status: DC

## 2023-08-01 MED ORDER — ACETAMINOPHEN 500 MG PO TABS: 1000.0000 mg | ORAL_TABLET | Freq: Four times a day (QID) | ORAL | 2 refills | Status: DC | PRN

## 2023-08-01 NOTE — Discharge Summary (Signed)
 Physician Discharge Summary  Kristine Stafford UEA:[id number] DOB: 1950/08/30 DOA: 07/28/2023  PCP: Sun, Vyvyan, MD  Admit date: 07/28/2023 Discharge date: 08/01/23  Admitted From: Memory care Disposition: Memory care Recommendations for Outpatient Follow-up:  Follow up with PCP in 1 week Check CMP and CBC at follow-up Ongoing goal of care discussion Please follow up on the following pending results: None  Home Health: None Equipment/Devices: None  Discharge Condition: Stable CODE STATUS: DNR  Follow-up Information     Care, [facility] Follow up.   Why: Agency will call you to set up apt times Contact information: [address] [place] [address] [phone]                 Hospital course 73 year old F with PMH of severe Alzheimer's dementia, chronic pain syndrome, migraine headaches, thyroid disease, hyperlipidemia, GERD and recent UTI presenting with altered mental status and generalized weakness and admitted with acute encephalopathy, tremors and rhabdomyolysis.   In ED, she was slightly tachycardic and tremulous.  CMP without significant finding other than mildly elevated AST to 67.  CK elevated to 4300.  WBC 12.1.  Lactic acid negative.  UA unrevealing.  CT head, CXR and twelve-lead EKG without significant finding.  Cultures obtained.  Patient was started on IV fluids and admitted for further care.  Blood culture with Staph epidermidis in 1 out of 4 bottles likely contaminant.  Encephalopathy improved after adjusting home medications.  However, she has waxing and waning mental status likely from underlying dementia.  Rhabdomyolysis improved with IV fluid.  Patient is discharged back to memory care.    See individual problem list below for more.   Problems addressed during this hospitalization Acute metabolic encephalopathy with tremors: Could be due to dehydration, medications and underlying dementia.  Encephalopathy workup including CT  head, EEG, UA and ammonia unrevealing.  EEG with moderate diffuse encephalopathy but no seizure or epileptiform discharge.  Norco, Robaxin , Ativan, melatonin, duloxetine, and pregabalin discontinued.  Dehydration resolved with IV fluid hydration.  She is awake and alert but not oriented.  Responds to some questions.  -Encourage good hydration -Ongoing goal of care discussion -Reorientation and delirium precaution -Buspirone and Depakote resumed  Positive blood culture: Blood culture with Staph epidermidis in 1 out of 4 bottles.  Likely contaminant.  Bacteremia and sepsis ruled out.   Nontraumatic rhabdomyolysis: Likely from immobility.  She is also on Crestor.  CK improved from 4300-500 -Discontinue Crestor -Encourage oral hydration. Etiology unclear.  Could be multifactorial secondary to tremors in the setting of dehydration.  CK level was elevated at 4297.  Improvement noted to 1031 with IV hydration.  Renal function is normal.   Alzheimer's dementia:  -Continue Depakote, Namenda  and Aricept . -Reorientation and delirium precaution   GERD: Continue with PPIs   Hypothyroidism:  -Continue with levothyroxine.   Hyperlipidemia:  -Discontinued Crestor given rhabdomyolysis.   Chronic pain syndrome:  - P.o. Tylenol  around-the-clock.  At risk for polypharmacy: -Norco, Robaxin , Ativan, melatonin, duloxetine and pregabalin discontinued.           Time spent 35 minutes  Vital signs Vitals:   07/31/23 1610 07/31/23 2008 08/01/23 0411 08/01/23 0727  BP: 131/76 92/64 136/79 (!) 131/54  Pulse:  (!) 109 99 (!) 109  Temp: 98.5 F (36.9 C) 99.1 F (37.3 C) 98.7 F (37.1 C) 99.8 F (37.7 C)  Resp: 17 19 18 16   Height:      Weight:      SpO2: 97%  95% 96% 96%  TempSrc: Oral Oral Oral Oral  BMI (Calculated):         Discharge exam  GENERAL: No apparent distress.  Nontoxic. HEENT: MMM.  Vision and hearing grossly intact.  NECK: Supple.  No apparent JVD.  RESP:  No IWOB.  Fair  aeration bilaterally. CVS:  RRR. Heart sounds normal.  ABD/GI/GU: BS+. Abd soft, NTND.  MSK/EXT:  Moves extremities. No apparent deformity. No edema.  SKIN: no apparent skin lesion or wound NEURO: Awake but not oriented.  Follows some commands.  No apparent focal neuro deficit. PSYCH: Calm.  No distress or agitation.  Discharge Instructions Discharge Instructions     Diet - low sodium heart healthy   Complete by: As directed    Increase activity slowly   Complete by: As directed    No wound care   Complete by: As directed       Allergies as of 08/01/2023       Reactions   Egg-derived Products Other (See Comments)   Husband unaware of intolerance   Other    Oats,poppyseeds,yeast,baker and brewers-Husband unaware of intolerance   Penicillins Other (See Comments)   Unknown reaction   Tetracyclines & Related Other (See Comments)   Unknown reaction        Medication List     STOP taking these medications    DULoxetine 20 MG capsule Commonly known as: CYMBALTA   HYDROcodone -acetaminophen  5-325 MG tablet Commonly known as: NORCO/VICODIN   LORazepam 0.5 MG tablet Commonly known as: ATIVAN   Lyrica 75 MG capsule Generic drug: pregabalin   methocarbamol  500 MG tablet Commonly known as: ROBAXIN    RA Melatonin 10 MG Tabs Generic drug: Melatonin   rosuvastatin 10 MG tablet Commonly known as: CRESTOR       TAKE these medications    acetaminophen  325 MG tablet Commonly known as: TYLENOL  Take 2 tablets (650 mg total) by mouth 3 (three) times daily. What changed:  medication strength how much to take   busPIRone 7.5 MG tablet Commonly known as: BUSPAR Take 7.5 mg by mouth 2 (two) times daily.   calcium carbonate 500 MG chewable tablet Commonly known as: TUMS - dosed in mg elemental calcium Chew 1 tablet by mouth every 6 (six) hours as needed for indigestion or heartburn.   cyanocobalamin  1000 MCG tablet Commonly known as: VITAMIN B12 Take 1,000 mcg by  mouth in the morning.   divalproex 125 MG capsule Commonly known as: DEPAKOTE SPRINKLE Take 125 mg by mouth 2 (two) times daily.   donepezil  10 MG tablet Commonly known as: ARICEPT  Take 1 tablet (10 mg total) by mouth at bedtime.   loperamide 2 MG tablet Commonly known as: IMODIUM A-D Take 1 tablet by mouth every 6 (six) hours as needed for diarrhea or loose stools.   memantine  10 MG tablet Commonly known as: NAMENDA  Take 1 tablet   twice a day   pantoprazole 40 MG tablet Commonly known as: PROTONIX Take 40 mg by mouth daily.   Vitamin D3 125 MCG (5000 UT) Tabs Take 5,000 Units by mouth in the morning.        Consultations: None  Procedures/Studies:   EEG adult Result Date: 07/30/2023 Arleene Lack, MD     07/30/2023  9:25 AM Patient Name: Kristine Stafford MRN: [id number] Epilepsy Attending: Arleene Lack Referring Physician/Provider: Oral Billings, MD Date: 07/29/2023 Duration: 26.02 mins Patient history: 73yo F with ams and seizure like activity. EEG to evaluate for  seizure. Level of alertness: Awake AEDs during EEG study: PGB Technical aspects: This EEG study was done with scalp electrodes positioned according to the 10-20 International system of electrode placement. Electrical activity was reviewed with band pass filter of 1-70Hz , sensitivity of 7 uV/mm, display speed of 47mm/sec with a 60Hz  notched filter applied as appropriate. EEG data were recorded continuously and digitally stored.  Video monitoring was available and reviewed as appropriate. Description: EEG showed continuous generalized 3 to 5 Hz theta-delta slowing. Physiologic photic driving was not seen during photic stimulation.  Hyperventilation was not performed.   ABNORMALITY - Continuous slow, generalized IMPRESSION: This study is suggestive of moderate diffuse encephalopathy. No seizures or epileptiform discharges were seen throughout the recording. Arleene Lack   CT Head Wo Contrast Result Date:  07/28/2023 CLINICAL DATA:  Altered mental status. EXAM: CT HEAD WITHOUT CONTRAST TECHNIQUE: Contiguous axial images were obtained from the base of the skull through the vertex without intravenous contrast. RADIATION DOSE REDUCTION: This exam was performed according to the departmental dose-optimization program which includes automated exposure control, adjustment of the mA and/or kV according to patient size and/or use of iterative reconstruction technique. COMPARISON:  April 27, 2023 FINDINGS: Brain: There is generalized cerebral atrophy with widening of the extra-axial spaces and ventricular dilatation. There are areas of decreased attenuation within the white matter tracts of the supratentorial brain, consistent with microvascular disease changes. Vascular: Marked severity bilateral cavernous carotid artery calcification is noted. Skull: Negative for an acute fracture. Sinuses/Orbits: Moderate severity sphenoid sinus mucosal thickening is seen. Other: None. IMPRESSION: 1. Generalized cerebral atrophy and microvascular disease changes of the supratentorial brain. 2. No acute intracranial abnormality. 3. Moderate severity sphenoid sinus disease. Electronically Signed   By: Virgle Grime M.D.   On: 07/28/2023 22:47   DG Chest Port 1 View Result Date: 07/28/2023 CLINICAL DATA:  Questionable sepsis - evaluate for abnormality , increased fatigue EXAM: PORTABLE CHEST - 1 VIEW COMPARISON:  April 27, 2023 FINDINGS: Lower lung volumes. No focal airspace consolidation, pleural effusion, or pneumothorax. Streaky atelectasis of the left lung base. The cardiac silhouette is at the upper limits of normal, likely accentuated by AP technique and low lung volumes. Tortuous aorta with aortic atherosclerosis. No acute fracture or destructive lesions. Multilevel thoracic osteophytosis. IMPRESSION: No acute cardiopulmonary abnormality. Electronically Signed   By: Rance Burrows M.D.   On: 07/28/2023 17:50       The  results of significant diagnostics from this hospitalization (including imaging, microbiology, ancillary and laboratory) are listed below for reference.     Microbiology: Recent Results (from the past 240 hours)  Blood Culture (routine x 2)     Status: Abnormal   Collection Time: 07/28/23  5:49 PM   Specimen: BLOOD  Result Value Ref Range Status   Specimen Description BLOOD SITE NOT SPECIFIED  Final   Special Requests   Final    BOTTLES DRAWN AEROBIC AND ANAEROBIC Blood Culture adequate volume   Culture  Setup Time   Final    GRAM POSITIVE COCCI IN PAIRS AEROBIC BOTTLE ONLY CRITICAL RESULT CALLED TO, READ BACK BY AND VERIFIED WITH: PHARMD CAREN AMEND ON 07/29/23 @ 1921 BY DRT    Culture (A)  Final    STAPHYLOCOCCUS EPIDERMIDIS THE SIGNIFICANCE OF ISOLATING THIS ORGANISM FROM A SINGLE SET OF BLOOD CULTURES WHEN MULTIPLE SETS ARE DRAWN IS UNCERTAIN. PLEASE NOTIFY THE MICROBIOLOGY DEPARTMENT WITHIN ONE WEEK IF SPECIATION AND SENSITIVITIES ARE REQUIRED. Performed at [facility], [address] [place], [place] [address]    Report Status 07/30/2023 FINAL  Final  Blood Culture (routine x 2)     Status: None (Preliminary result)   Collection Time: 07/28/23  5:49 PM   Specimen: BLOOD  Result Value Ref Range Status   Specimen Description BLOOD SITE NOT SPECIFIED  Final   Special Requests   Final    BOTTLES DRAWN AEROBIC AND ANAEROBIC Blood Culture adequate volume   Culture   Final    NO GROWTH 4 DAYS Performed at [facility], [address] [place], [place] [address]    Report Status PENDING  Incomplete  Blood Culture ID Panel (Reflexed)     Status: Abnormal   Collection Time: 07/28/23  5:49 PM  Result Value Ref Range Status   Enterococcus faecalis NOT DETECTED NOT DETECTED Final   Enterococcus Faecium NOT DETECTED NOT DETECTED Final   Listeria monocytogenes NOT DETECTED NOT DETECTED Final   Staphylococcus species DETECTED (A) NOT DETECTED Final    Comment: CRITICAL  RESULT CALLED TO, READ BACK BY AND VERIFIED WITH: PHARMD CAREN AMEND ON 07/29/23 @ 1921 BY DRT    Staphylococcus aureus (BCID) NOT DETECTED NOT DETECTED Final   Staphylococcus epidermidis DETECTED (A) NOT DETECTED Final    Comment: Methicillin (oxacillin) resistant coagulase negative staphylococcus. Possible blood culture contaminant (unless isolated from more than one blood culture draw or clinical case suggests pathogenicity). No antibiotic treatment is indicated for blood  culture contaminants. CRITICAL RESULT CALLED TO, READ BACK BY AND VERIFIED WITH: PHARMD CAREN AMEND ON 07/29/23 @ 1921 BY DRT    Staphylococcus lugdunensis NOT DETECTED NOT DETECTED Final   Streptococcus species NOT DETECTED NOT DETECTED Final   Streptococcus agalactiae NOT DETECTED NOT DETECTED Final   Streptococcus pneumoniae NOT DETECTED NOT DETECTED Final   Streptococcus pyogenes NOT DETECTED NOT DETECTED Final   A.calcoaceticus-baumannii NOT DETECTED NOT DETECTED Final   Bacteroides fragilis NOT DETECTED NOT DETECTED Final   Enterobacterales NOT DETECTED NOT DETECTED Final   Enterobacter cloacae complex NOT DETECTED NOT DETECTED Final   Escherichia coli NOT DETECTED NOT DETECTED Final   Klebsiella aerogenes NOT DETECTED NOT DETECTED Final   Klebsiella oxytoca NOT DETECTED NOT DETECTED Final   Klebsiella pneumoniae NOT DETECTED NOT DETECTED Final   Proteus species NOT DETECTED NOT DETECTED Final   Salmonella species NOT DETECTED NOT DETECTED Final   Serratia marcescens NOT DETECTED NOT DETECTED Final   Haemophilus influenzae NOT DETECTED NOT DETECTED Final   Neisseria meningitidis NOT DETECTED NOT DETECTED Final   Pseudomonas aeruginosa NOT DETECTED NOT DETECTED Final   Stenotrophomonas maltophilia NOT DETECTED NOT DETECTED Final   Candida albicans NOT DETECTED NOT DETECTED Final   Candida auris NOT DETECTED NOT DETECTED Final   Candida glabrata NOT DETECTED NOT DETECTED Final   Candida krusei NOT DETECTED NOT  DETECTED Final   Candida parapsilosis NOT DETECTED NOT DETECTED Final   Candida tropicalis NOT DETECTED NOT DETECTED Final   Cryptococcus neoformans/gattii NOT DETECTED NOT DETECTED Final   Methicillin resistance mecA/C DETECTED (A) NOT DETECTED Final    Comment: CRITICAL RESULT CALLED TO, READ BACK BY AND VERIFIED WITH: PHARMD CAREN AMEND ON 07/29/23 @ 1921 BY DRT Performed at [facility], [address], [place], [place] [address]      Labs:  CBC: Recent Labs  Lab 07/28/23 1722 07/29/23 0818 07/30/23 0619 07/31/23 0629  WBC 12.1* 8.5 6.3 7.5  NEUTROABS 9.0*  --   --   --  HGB 12.1 11.2* 11.4* 13.2  HCT 38.5 34.9* 36.6 40.2  MCV 92.3 90.9 91.7 88.7  PLT 283 221 203 284   BMP &GFR Recent Labs  Lab 07/28/23 1722 07/29/23 0818 07/30/23 0619 07/31/23 0629 08/01/23 0500  NA 141 139 140 139 136  K 4.0 3.7 3.6 4.4 3.7  CL 107 107 108 103 103  CO2 28 24 22  21* 22  GLUCOSE 122* 109* 99 104* 128*  BUN 26* 17 8 13 15   CREATININE 0.72 0.60 0.61 0.63 0.59  CALCIUM 8.7* 8.7* 8.5* 9.0 8.6*  MG  --   --  2.0 2.0  --    Estimated Creatinine Clearance: 60 mL/min (by C-G formula based on SCr of 0.59 mg/dL). Liver & Pancreas: Recent Labs  Lab 07/28/23 1722 07/29/23 0818 07/30/23 0619 07/31/23 0629  AST 67* 49* 36 35  ALT 23 22 20 19   ALKPHOS 45 38 37* 46  BILITOT 0.7 0.5 0.7 1.1  PROT 6.4* 5.9* 5.4* 6.3*  ALBUMIN 3.4* 3.0* 2.6* 2.9*   No results for input(s): LIPASE, AMYLASE in the last 168 hours. Recent Labs  Lab 08/01/23 0500  AMMONIA 17   Diabetic: No results for input(s): HGBA1C in the last 72 hours. No results for input(s): GLUCAP in the last 168 hours. Cardiac Enzymes: Recent Labs  Lab 07/28/23 1722 07/29/23 0818 07/30/23 0619 08/01/23 0500  CKTOTAL 4,297* 2,439* 1,031* 587*   No results for input(s): PROBNP in the last 8760 hours. Coagulation Profile: Recent Labs  Lab 07/28/23 1749  INR 1.1   Thyroid Function Tests: No results  for input(s): TSH, T4TOTAL, FREET4, T3FREE, THYROIDAB in the last 72 hours. Lipid Profile: No results for input(s): CHOL, HDL, LDLCALC, TRIG, CHOLHDL, LDLDIRECT in the last 72 hours. Anemia Panel: No results for input(s): VITAMINB12, FOLATE, FERRITIN, TIBC, IRON, RETICCTPCT in the last 72 hours. Urine analysis:    Component Value Date/Time   COLORURINE YELLOW 07/28/2023 1659   APPEARANCEUR CLEAR 07/28/2023 1659   LABSPEC 1.029 07/28/2023 1659   PHURINE 5.0 07/28/2023 1659   GLUCOSEU NEGATIVE 07/28/2023 1659   HGBUR NEGATIVE 07/28/2023 1659   BILIRUBINUR NEGATIVE 07/28/2023 1659   KETONESUR 5 (A) 07/28/2023 1659   PROTEINUR 30 (A) 07/28/2023 1659   NITRITE NEGATIVE 07/28/2023 1659   LEUKOCYTESUR NEGATIVE 07/28/2023 1659   Sepsis Labs: Invalid input(s): PROCALCITONIN, LACTICIDVEN   SIGNED:  Senon Nixon T Aniyla Harling, MD  [facility] 08/01/2023, 11:12 AM

## 2023-08-01 NOTE — Progress Notes (Signed)
 Unifocal PVC's reported by telemetry and reported to Gwenetta Lennert, NP with no additional orders.

## 2023-08-01 NOTE — NC FL2 (Signed)
 [place]  MEDICAID [id number] LEVEL OF CARE FORM     IDENTIFICATION  Patient Name: Kristine Stafford Birthdate: [birth year] Sex: female Admission Date (Current Location): 07/28/2023  [facility] and [place] Number:  Producer, television/film/video and Address:  [facility] [place] [address] [place], [place] [address]      Provider Number: [id number]  Attending Physician Name and Address:  Theadore Finger, MD  Relative Name and Phone Number:  Alm Jacks Significant other [phone]    Current Level of Care: Hospital Recommended Level of Care: Assisted Living Facility [facility] memory care) Prior Approval Number:    Date Approved/Denied:   PASRR Number:    Discharge Plan: Other (Comment) ([facility] memory care)    Current Diagnoses: Patient Active Problem List   Diagnosis Date Noted   Rhabdomyolysis 07/28/2023   Major neurocognitive disorder due to Alzheimer's disease 01/15/2021   Pure hypercholesterolemia    Melanocytic nevi of trunk 01/11/2021   GERD (gastroesophageal reflux disease) 01/11/2021   Heart murmur 01/11/2021   Osteopenia 01/11/2021   Osteoporosis 01/11/2021   PVC (premature ventricular contraction) 01/11/2021   Thyroid disease 01/11/2021   Chronic pain syndrome    Left hip pain    Tick bite 09/08/2015   Genital HSV 04/08/2012    Orientation RESPIRATION BLADDER Height & Weight      (not oriented)  Normal Incontinent, External catheter Weight: 148 lb 3.2 oz (67.2 kg) Height:  5' 7 (170.2 cm)  BEHAVIORAL SYMPTOMS/MOOD NEUROLOGICAL BOWEL NUTRITION STATUS        Diet (low sodium, heart healthy)  AMBULATORY STATUS COMMUNICATION OF NEEDS Skin   Limited Assist   Other (Comment) (blister)                       Personal Care Assistance Level of Assistance  Bathing, Feeding, Dressing Bathing Assistance: Maximum assistance Feeding assistance: Limited assistance Dressing Assistance: Maximum assistance     Functional Limitations  Info             SPECIAL CARE FACTORS FREQUENCY  PT (By licensed PT), OT (By licensed OT)     PT Frequency: evaluate and treat OT Frequency: evaluate and treat            Contractures Contractures Info: Not present    Additional Factors Info  Code Status, Allergies Code Status Info: DNR Allergies Info: Egg-derived Products, Other, Penicillins, Tetracyclines & Related           Current Medications (08/01/2023):  This is the current hospital active medication list Current Facility-Administered Medications  Medication Dose Route Frequency Provider Last Rate Last Admin   divalproex (DEPAKOTE SPRINKLE) capsule 125 mg  125 mg Oral BID Krishnan, Gokul, MD   125 mg at 08/01/23 8413   donepezil  (ARICEPT ) tablet 10 mg  10 mg Oral QHS Garba, Mohammad L, MD   10 mg at 07/31/23 2300   enoxaparin (LOVENOX) injection 40 mg  40 mg Subcutaneous Q24H Sudie Ely L, MD   40 mg at 08/01/23 2440   haloperidol lactate (HALDOL) injection 5 mg  5 mg Intravenous Q6H PRN Chiu, Stephen K, MD   5 mg at 07/30/23 1027   memantine  (NAMENDA ) tablet 10 mg  10 mg Oral BID Garba, Mohammad L, MD   10 mg at 08/01/23 2536   ondansetron  (ZOFRAN ) tablet 4 mg  4 mg Oral Q6H PRN Davida Espy, MD       Or   ondansetron  (ZOFRAN ) injection  4 mg  4 mg Intravenous Q6H PRN Garba, Mohammad L, MD       pantoprazole (PROTONIX) EC tablet 40 mg  40 mg Oral Daily Krishnan, Gokul, MD   40 mg at 08/01/23 1610   sodium chloride  flush (NS) 0.9 % injection 10-40 mL  10-40 mL Intracatheter Q12H Maylene Spear, MD   10 mL at 08/01/23 9604   sodium chloride  flush (NS) 0.9 % injection 10-40 mL  10-40 mL Intracatheter PRN Maylene Spear, MD       sodium chloride  flush (NS) 0.9 % injection 10-40 mL  10-40 mL Intracatheter PRN Krishnan, Gokul, MD         Discharge Medications: Please see discharge summary for a list of discharge medications.  Relevant Imaging Results:  Relevant Lab Results:   Additional  Information SSN: [id number]  Elspeth Hals, LCSW

## 2023-08-01 NOTE — Progress Notes (Signed)
 Called report to [facility] to [place] for patient discharge. Patient will leave by PTAR.

## 2023-08-01 NOTE — Plan of Care (Signed)
  Problem: Coping: Goal: Level of anxiety will decrease Outcome: Progressing   Problem: Nutrition: Goal: Adequate nutrition will be maintained Outcome: Progressing   Problem: Activity: Goal: Risk for activity intolerance will decrease Outcome: Progressing   Problem: Elimination: Goal: Will not experience complications related to bowel motility Outcome: Progressing   Problem: Elimination: Goal: Will not experience complications related to urinary retention Outcome: Progressing

## 2023-08-01 NOTE — TOC Progression Note (Signed)
 Transition of Care [facility]) - Progression Note    Patient Details  Name: Kristine Stafford MRN: [id number] Date of Birth: 1950-02-12  Transition of Care [facility]) CM/SW Contact  Elspeth Hals, LCSW Phone Number: 08/01/2023, 11:59 AM  Clinical Narrative:    CSW spoke with Caroline/[facility]. [phone]. She is requesting update on how pt is doing prior to her return.  RN informed.  She also needs FL2 and DC summary to: [email or web address].  1200: DC summary, FL2 emailed securely.         Expected Discharge Plan and Services         Expected Discharge Date: 08/01/23                                     Social Determinants of Health (SDOH) Interventions SDOH Screenings   Food Insecurity: No Food Insecurity (07/29/2023)  Housing: Low Risk  (07/29/2023)  Transportation Needs: No Transportation Needs (07/29/2023)  Utilities: Not At Risk (07/29/2023)  Social Connections: Unknown (07/29/2023)  Tobacco Use: Low Risk  (07/28/2023)    Readmission Risk Interventions     No data to display

## 2023-08-01 NOTE — TOC Transition Note (Signed)
 Transition of Care [facility]) - Discharge Note   Patient Details  Name: Joneisha Miles MRN: [id number] Date of Birth: [birth year]  Transition of Care [facility]) CM/SW Contact:  Elspeth Hals, LCSW Phone Number: 08/01/2023, 1:20 PM   Clinical Narrative:   Pt discharging to [facility].  RN call report to [phone].  PTAR called [phone].     Final next level of care: Memory Care Barriers to Discharge: Barriers Resolved   Patient Goals and CMS Choice            Discharge Placement              Patient chooses bed at:  [facility]) Patient to be transferred to facility by: ptar Name of family member notified: Josiah Nigh, significant other in room Patient and family notified of of transfer: 08/01/23  Discharge Plan and Services Additional resources added to the After Visit Summary for                                       Social Drivers of Health (SDOH) Interventions SDOH Screenings   Food Insecurity: No Food Insecurity (07/29/2023)  Housing: Low Risk  (07/29/2023)  Transportation Needs: No Transportation Needs (07/29/2023)  Utilities: Not At Risk (07/29/2023)  Social Connections: Unknown (07/29/2023)  Tobacco Use: Low Risk  (07/28/2023)     Readmission Risk Interventions     No data to display

## 2023-08-01 NOTE — Care Management Important Message (Signed)
 Important Message  Patient Details  Name: Kristine Stafford MRN: [id number] Date of Birth: 1950-06-20   Important Message Given:  Yes - Medicare IM     Felix Host 08/01/2023, 12:36 PM

## 2023-08-02 ENCOUNTER — Emergency Department (HOSPITAL_COMMUNITY)

## 2023-08-02 ENCOUNTER — Encounter (HOSPITAL_COMMUNITY): Payer: Self-pay

## 2023-08-02 ENCOUNTER — Inpatient Hospital Stay (HOSPITAL_COMMUNITY)
Admission: EM | Admit: 2023-08-02 | Discharge: 2023-08-05 | DRG: 871 | Disposition: A | Discharge status: Hospice | Attending: Internal Medicine | Admitting: Internal Medicine

## 2023-08-02 DIAGNOSIS — G894 Chronic pain syndrome: Secondary | ICD-10-CM | POA: Diagnosis present

## 2023-08-02 DIAGNOSIS — R799 Abnormal finding of blood chemistry, unspecified: Secondary | ICD-10-CM

## 2023-08-02 DIAGNOSIS — E039 Hypothyroidism, unspecified: Secondary | ICD-10-CM | POA: Diagnosis present

## 2023-08-02 DIAGNOSIS — J90 Pleural effusion, not elsewhere classified: Secondary | ICD-10-CM | POA: Diagnosis not present

## 2023-08-02 DIAGNOSIS — Z7189 Other specified counseling: Secondary | ICD-10-CM

## 2023-08-02 DIAGNOSIS — E7800 Pure hypercholesterolemia, unspecified: Secondary | ICD-10-CM | POA: Diagnosis present

## 2023-08-02 DIAGNOSIS — F0280 Dementia in other diseases classified elsewhere without behavioral disturbance: Secondary | ICD-10-CM | POA: Diagnosis present

## 2023-08-02 DIAGNOSIS — R627 Adult failure to thrive: Secondary | ICD-10-CM | POA: Diagnosis present

## 2023-08-02 DIAGNOSIS — I959 Hypotension, unspecified: Secondary | ICD-10-CM | POA: Diagnosis present

## 2023-08-02 DIAGNOSIS — R1311 Dysphagia, oral phase: Secondary | ICD-10-CM | POA: Diagnosis present

## 2023-08-02 DIAGNOSIS — R0689 Other abnormalities of breathing: Secondary | ICD-10-CM | POA: Diagnosis not present

## 2023-08-02 DIAGNOSIS — R1310 Dysphagia, unspecified: Secondary | ICD-10-CM | POA: Diagnosis not present

## 2023-08-02 DIAGNOSIS — R0902 Hypoxemia: Secondary | ICD-10-CM | POA: Diagnosis not present

## 2023-08-02 DIAGNOSIS — L8990 Pressure ulcer of unspecified site, unspecified stage: Secondary | ICD-10-CM | POA: Insufficient documentation

## 2023-08-02 DIAGNOSIS — Z515 Encounter for palliative care: Secondary | ICD-10-CM | POA: Diagnosis not present

## 2023-08-02 DIAGNOSIS — J449 Chronic obstructive pulmonary disease, unspecified: Secondary | ICD-10-CM | POA: Diagnosis not present

## 2023-08-02 DIAGNOSIS — L89321 Pressure ulcer of left buttock, stage 1: Secondary | ICD-10-CM | POA: Diagnosis present

## 2023-08-02 DIAGNOSIS — Z66 Do not resuscitate: Secondary | ICD-10-CM | POA: Diagnosis present

## 2023-08-02 DIAGNOSIS — L89312 Pressure ulcer of right buttock, stage 2: Secondary | ICD-10-CM | POA: Diagnosis present

## 2023-08-02 DIAGNOSIS — J690 Pneumonitis due to inhalation of food and vomit: Secondary | ICD-10-CM | POA: Diagnosis present

## 2023-08-02 DIAGNOSIS — R55 Syncope and collapse: Secondary | ICD-10-CM | POA: Diagnosis not present

## 2023-08-02 DIAGNOSIS — A419 Sepsis, unspecified organism: Principal | ICD-10-CM | POA: Diagnosis present

## 2023-08-02 DIAGNOSIS — Z880 Allergy status to penicillin: Secondary | ICD-10-CM

## 2023-08-02 DIAGNOSIS — R000 Tachycardia, unspecified: Secondary | ICD-10-CM | POA: Diagnosis not present

## 2023-08-02 DIAGNOSIS — J9601 Acute respiratory failure with hypoxia: Secondary | ICD-10-CM | POA: Diagnosis present

## 2023-08-02 DIAGNOSIS — G309 Alzheimer's disease, unspecified: Secondary | ICD-10-CM | POA: Diagnosis present

## 2023-08-02 DIAGNOSIS — J949 Pleural condition, unspecified: Secondary | ICD-10-CM | POA: Diagnosis not present

## 2023-08-02 DIAGNOSIS — K219 Gastro-esophageal reflux disease without esophagitis: Secondary | ICD-10-CM | POA: Diagnosis present

## 2023-08-02 DIAGNOSIS — Z7401 Bed confinement status: Secondary | ICD-10-CM | POA: Diagnosis not present

## 2023-08-02 DIAGNOSIS — R231 Pallor: Secondary | ICD-10-CM | POA: Diagnosis not present

## 2023-08-02 DIAGNOSIS — R918 Other nonspecific abnormal finding of lung field: Secondary | ICD-10-CM | POA: Diagnosis not present

## 2023-08-02 LAB — COMPREHENSIVE METABOLIC PANEL WITH GFR
ALT: 19 U/L (ref 0–44)
AST: 21 U/L (ref 15–41)
Albumin: 2.9 g/dL — ABNORMAL LOW (ref 3.5–5.0)
Alkaline Phosphatase: 47 U/L (ref 38–126)
Anion gap: 10 (ref 5–15)
BUN: 14 mg/dL (ref 8–23)
CO2: 26 mmol/L (ref 22–32)
Calcium: 9.2 mg/dL (ref 8.9–10.3)
Chloride: 103 mmol/L (ref 98–111)
Creatinine, Ser: 0.77 mg/dL (ref 0.44–1.00)
GFR, Estimated: >60 mL/min (ref >60)
Glucose, Bld: 119 mg/dL — ABNORMAL HIGH (ref 70–99)
Potassium: 3.6 mmol/L (ref 3.5–5.1)
Sodium: 139 mmol/L (ref 135–145)
Total Bilirubin: 0.4 mg/dL (ref 0.0–1.2)
Total Protein: 6.4 g/dL — ABNORMAL LOW (ref 6.5–8.1)

## 2023-08-02 LAB — URINALYSIS, W/ REFLEX TO CULTURE (INFECTION SUSPECTED)
Bacteria, UA: NONE SEEN
Bilirubin Urine: NEGATIVE
Glucose, UA: NEGATIVE mg/dL
Hgb urine dipstick: NEGATIVE
Ketones, ur: 5 mg/dL — AB
Leukocytes,Ua: NEGATIVE
Nitrite: NEGATIVE
Protein, ur: NEGATIVE mg/dL
Specific Gravity, Urine: 1.024 (ref 1.005–1.030)
pH: 5 (ref 5.0–8.0)

## 2023-08-02 LAB — BRAIN NATRIURETIC PEPTIDE: B Natriuretic Peptide: 121.3 pg/mL — ABNORMAL HIGH (ref 0.0–100.0)

## 2023-08-02 LAB — CBC WITH DIFFERENTIAL/PLATELET
Abs Immature Granulocytes: 0.03 10*3/uL (ref 0.00–0.07)
Basophils Absolute: 0 10*3/uL (ref 0.0–0.1)
Basophils Relative: 0 %
Eosinophils Absolute: 0.1 10*3/uL (ref 0.0–0.5)
Eosinophils Relative: 1 %
HCT: 40.1 % (ref 36.0–46.0)
Hemoglobin: 12.8 g/dL (ref 12.0–15.0)
Immature Granulocytes: 0 %
Lymphocytes Relative: 9 %
Lymphs Abs: 1 10*3/uL (ref 0.7–4.0)
MCH: 28.6 pg (ref 26.0–34.0)
MCHC: 31.9 g/dL (ref 30.0–36.0)
MCV: 89.7 fL (ref 80.0–100.0)
Monocytes Absolute: 0.8 10*3/uL (ref 0.1–1.0)
Monocytes Relative: 7 %
Neutro Abs: 9 10*3/uL — ABNORMAL HIGH (ref 1.7–7.7)
Neutrophils Relative %: 83 %
Platelets: 308 10*3/uL (ref 150–400)
RBC: 4.47 MIL/uL (ref 3.87–5.11)
RDW: 14.2 % (ref 11.5–15.5)
WBC: 11 10*3/uL — ABNORMAL HIGH (ref 4.0–10.5)
nRBC: 0 % (ref 0.0–0.2)

## 2023-08-02 LAB — CULTURE, BLOOD (ROUTINE X 2)
Culture: NO GROWTH
Special Requests: ADEQUATE

## 2023-08-02 LAB — PROTIME-INR
INR: 1 (ref 0.8–1.2)
Prothrombin Time: 13.7 s (ref 11.4–15.2)

## 2023-08-02 LAB — I-STAT CG4 LACTIC ACID, ED: Lactic Acid, Venous: 1.4 mmol/L (ref 0.5–1.9)

## 2023-08-02 LAB — CK: Total CK: 194 U/L (ref 38–234)

## 2023-08-02 MED ORDER — VANCOMYCIN HCL IN DEXTROSE 1-5 GM/200ML-% IV SOLN
1000.0000 mg | Freq: Once | INTRAVENOUS | Status: AC
  Administered 2023-08-02: 1000 mg via INTRAVENOUS
  Filled 2023-08-02: qty 200

## 2023-08-02 MED ORDER — ACETAMINOPHEN 650 MG RE SUPP
650.0000 mg | Freq: Once | RECTAL | Status: AC
  Administered 2023-08-02: 650 mg via RECTAL
  Filled 2023-08-02: qty 1

## 2023-08-02 MED ORDER — SODIUM CHLORIDE 0.9 % IV SOLN
2.0000 g | Freq: Once | INTRAVENOUS | Status: AC
  Administered 2023-08-02: 2 g via INTRAVENOUS
  Filled 2023-08-02: qty 10

## 2023-08-02 NOTE — Progress Notes (Signed)
 Elink following code sepsis

## 2023-08-02 NOTE — ED Provider Notes (Signed)
 [place] EMERGENCY DEPARTMENT AT [facility] Provider Note   CSN: [id number] Arrival date & time: 08/02/23  2056     Patient presents with: Altered Mental Status   Kristine Stafford is a 73 y.o. female.   73 year old female with a past medical history of Alzheimer's presents to the ED via EMS from memory care facility due to worsening mental status.  According to nursing staff they reported that patient's wife states that she has been less talkative, began to have a productive cough, was very slow to respond.  Patient was hospitalized recently due to nontraumatic rhabdomyolysis due to immobility.  Today, she arrives febrile with a rectal temperature of 101.7, tachycardic along with hypotensive.  Level 5 caveat due to dementia.  The history is provided by the patient.  Altered Mental Status      Prior to Admission medications   Medication Sig Start Date End Date Taking? Authorizing Provider  acetaminophen  (TYLENOL ) 325 MG tablet Take 2 tablets (650 mg total) by mouth 3 (three) times daily. 08/01/23   Gonfa, Taye T, MD  busPIRone (BUSPAR) 7.5 MG tablet Take 7.5 mg by mouth 2 (two) times daily. 06/25/23   [provider]  calcium carbonate (TUMS - DOSED IN MG ELEMENTAL CALCIUM) 500 MG chewable tablet Chew 1 tablet by mouth every 6 (six) hours as needed for indigestion or heartburn.    [provider]  Cholecalciferol (VITAMIN D3) 125 MCG (5000 UT) TABS Take 5,000 Units by mouth in the morning.    [provider]  divalproex  (DEPAKOTE  SPRINKLE) 125 MG capsule Take 125 mg by mouth 2 (two) times daily. 07/08/23   [provider]  donepezil  (ARICEPT ) 10 MG tablet Take 1 tablet (10 mg total) by mouth at bedtime. 07/24/22   Wertman, Sara E, PA-C  loperamide (IMODIUM A-D) 2 MG tablet Take 1 tablet by mouth every 6 (six) hours as needed for diarrhea or loose stools. 04/30/23   [provider]  memantine  (NAMENDA ) 10 MG tablet Take 1 tablet    twice a day 07/24/22   Wertman, Sara E, PA-C  pantoprazole  (PROTONIX ) 40 MG tablet Take 40 mg by mouth daily. 06/18/23   [provider]  vitamin B-12 (CYANOCOBALAMIN ) 1000 MCG tablet Take 1,000 mcg by mouth in the morning.    [provider]    Allergies: Egg-derived products, Other, Penicillins, and Tetracyclines & related    Review of Systems  Unable to perform ROS: Dementia    Updated Vital Signs BP 118/86   Pulse (!) 122   Temp (!) 101.7 F (38.7 C) (Rectal)   Resp (!) 24   Ht 5' 7 (1.702 m)   Wt 67.2 kg   SpO2 93%   BMI 23.20 kg/m   Physical Exam Vitals reviewed.  Constitutional:      Appearance: She is ill-appearing.  HENT:     Head: Normocephalic and atraumatic.     Mouth/Throat:     Mouth: Mucous membranes are dry.   Cardiovascular:     Rate and Rhythm: Tachycardia present.  Pulmonary:     Effort: Tachypnea present.     Breath sounds: Rhonchi and rales present.  Abdominal:     General: Abdomen is flat.     Palpations: Abdomen is soft.     Tenderness: There is no guarding or rebound.   Musculoskeletal:     Cervical back: Normal range of motion and neck supple.   Neurological:     Mental Status: She is  disoriented.     (all labs ordered are listed, but only abnormal results are displayed) Labs Reviewed  COMPREHENSIVE METABOLIC PANEL WITH GFR - Abnormal; Notable for the following components:      Result Value   Glucose, Bld 119 (*)    Total Protein 6.4 (*)    Albumin 2.9 (*)    All other components within normal limits  CBC WITH DIFFERENTIAL/PLATELET - Abnormal; Notable for the following components:   WBC 11.0 (*)    Neutro Abs 9.0 (*)    All other components within normal limits  BRAIN NATRIURETIC PEPTIDE - Abnormal; Notable for the following components:   B Natriuretic Peptide 121.3 (*)    All other components within normal limits  CULTURE, BLOOD (ROUTINE X 2)  CULTURE, BLOOD (ROUTINE X 2)  PROTIME-INR  CK  URINALYSIS, W/  REFLEX TO CULTURE (INFECTION SUSPECTED)  I-STAT CG4 LACTIC ACID, ED  I-STAT CG4 LACTIC ACID, ED    EKG: None  Radiology: DG Chest 1 View Result Date: 08/02/2023 CLINICAL DATA:  Hypoxia EXAM: CHEST  1 VIEW COMPARISON:  Chest x-ray 07/28/2023 FINDINGS: The heart size and mediastinal contours are within normal limits. Both lungs are clear. The visualized skeletal structures are unremarkable. IMPRESSION: No active disease. Electronically Signed   By: Greig Pique M.D.   On: 08/02/2023 22:40     .Critical Care  Performed by: Semisi Biela, PA-C Authorized by: Ruchama Kubicek, PA-C   Critical care provider statement:    Critical care time (minutes):  45   Critical care start time:  08/02/2023 10:00 PM   Critical care end time:  08/02/2023 10:45 PM   Critical care was necessary to treat or prevent imminent or life-threatening deterioration of the following conditions:  Sepsis   Critical care was time spent personally by me on the following activities:  Discussions with consultants   Care discussed with: admitting provider      Medications Ordered in the ED  acetaminophen  (TYLENOL ) suppository 650 mg (650 mg Rectal Given 08/02/23 2143)  aztreonam  (AZACTAM ) 2 g in sodium chloride  0.9 % 100 mL IVPB (0 g Intravenous Stopped 08/02/23 2232)  vancomycin  (VANCOCIN ) IVPB 1000 mg/200 mL premix (0 mg Intravenous Stopped 08/02/23 2245)    Clinical Course as of 08/02/23 2253  Sat Aug 02, 2023  2213 WBC(!): 11.0 [JS]    Clinical Course User Index [JS] Lillie Bollig, PA-C                                 Medical Decision Making Amount and/or Complexity of Data Reviewed Labs: ordered. Decision-making details documented in ED Course. Radiology: ordered.  Risk OTC drugs. Prescription drug management.    This patient presents to the ED for concern of AMS, this involves a number of treatment options, and is a complaint that carries with it a high risk of complications and morbidity.  The differential  diagnosis includes trauma, sepsis, CVA.    Co morbidities: Discussed in HPI   Brief History:  See HPI.   EMR reviewed including pt PMHx, past surgical history and past visits to ER.   See HPI for more details   Lab Tests:  I ordered and independently interpreted labs.  The pertinent results include:    Labs notable for CBC with a slight leukocytosis of 11, hemoglobin is within normal limits.  CMP with no electrolyte derangement, current levels unremarkable peer LFTs are within normal limits.  Lactic acid is 1.4, PT and INR normal.   Imaging Studies:  Chest x-ray showed  Cardiac Monitoring:  The patient was maintained on a cardiac monitor.  I personally viewed and interpreted the cardiac monitored which showed an underlying rhythm of: Sinus tachycardia EKG non-ischemic   Medicines ordered:  I ordered medication including Tylenol  for pyrexia Reevaluation of the patient after these medicines showed that the patient stayed the same I have reviewed the patients home medicines and have made adjustments as needed   Critical Interventions:   with patient's temperature of 101.7, tachycardia with a heart rate in the 120s, hypotension with a systolic in the 30s and tachypnea code sepsis was activated.  Only admitted for nontraumatic rhabdo myelosis, some concern for also UTI which she was treated for previously.  Lactic acid is negative here.  She does sound wet, therefore broad-spectrum antibiotic such as vancomycin  and Azactam  due to her PCN allergy have been ordered.   Reevaluation:  After the interventions noted above I re-evaluated patient and found that they have :stayed the same  Social Determinants of Health:  The patient's social determinants of health were a factor in the care of this patient  Problem List / ED Course:  Patient presents to the ED from memory care due to worsen mental status, discharged from the hospital yesterday at 1407 returns today febrile,  tachycardic and hypotensive. Code sepsis was activated. Husband provided some history to nursing staff but then went home therefore unable to obtain further history.  Interpretation of her labs and menorrhea CBC with a slight leukocytosis of 11, hemoglobin is within normal limits.  CMP with no electro derangement, creatinine was remarkable.  Lactic acid is negative.  BNP is elevated at 121.3.  Prior admission was due to a nontraumatic rhabdo myelosis, CK today is within normal limits.  She is currently wearing oxygen 4 L nasal cannula which is not her baseline.  She was found by EMS to have an oxygen saturation of 88% on room air. EKG reviewed remarkable for sinus tachycardia, blood cultures have also been ordered.  Patient started prophylactically on antibiotics such as vancomycin , Azactam  for broad spectrum coverage. She was recently treated for UTI, In-N-Out catheter has been unsuccessful thus far.  Given Tylenol  suppository for pyrexia. Chest x-ray does not show any active infection, however patient now requiring 4 L nasal cannula, she remains a DNR with document present at the bedside.  Did speak to Dr. Tobie hospitalist service, appreciate his assistance, will admit patient for further management.  Dispostion:  Due to patient ongoing sepsis she will likely need hospitalization for further evaluation.   Portions of this note were generated with Scientist, clinical (histocompatibility and immunogenetics). Dictation errors may occur despite best attempts at proofreading.   Final diagnoses:  Sepsis, due to unspecified organism, unspecified whether acute organ dysfunction present [facility])    ED Discharge Orders     None          Maureen Broad, PA-C 08/02/23 2253    Freddi Hamilton, MD 08/07/23 [phone]

## 2023-08-02 NOTE — ED Triage Notes (Signed)
 Pt from [facility] memory care via EMS.  Husband reports increased lethargy and worsening cough.  Pt was recently hospitalized, discharged 06/20.  Husband reports possible aspiration.

## 2023-08-02 NOTE — ED Notes (Signed)
 RN noted pt O2 saturation at 86%.  [place] turned up to 6L but did not increase sat.  RN assisted pt in sitting up and directed pt to cough.  Pt O2 saturation increased to 94% on 6L.

## 2023-08-02 NOTE — H&P (Signed)
 History and Physical    Kristine Stafford FMW:[id number] DOB: 1950/07/12 DOA: 08/02/2023  PCP: Sun, Vyvyan, MD  Patient coming from: [facility]  I have personally briefly reviewed patient's old medical records in [facility]  Chief Complaint: Shortness of breath, cough  HPI: Kristine Stafford is a 73 y.o. female with medical history significant for advanced Alzheimer's dementia, chronic pain syndrome, hypothyroidism, migraines, HLD, GERD who presented to the ED for evaluation of lethargy, cough, possible aspiration.  Patient unable to provide history due to advanced dementia which is ultimately supplemented by EDP, chart review, and husband at bedside.  Patient recently admitted 6/16-6/20 for acute metabolic encephalopathy with tremors thought multifactorial related to dehydration, medication side effect, and underlying dementia.  Workup including CT head EEG, UA, ammonia was unrevealing.  She was noted to have nontraumatic rhabdomyolysis likely from immobility.  She was treated with IV fluids.  Crestor was discontinued.  Due to polypharmacy her Norco, Robaxin , Ativan, melatonin, duloxetine, and pregabalin  were discontinued.  Husband states that patient was little better when she left the hospital.  Today while her facility while trying to eat and drink she began to have significant coughing fits.  She appeared to have increased work of breathing.  EMS were called and she was noted to have SpO2 88% on room air on their arrival.  She was placed on 4 L O2 via [place] and brought to the ED for further evaluation.  Husband states that patient has been at [facility] unit for 9 months.  She has been dealing with dementia for several years.  He notes that she has had significant decline over the last 3-4 weeks.  Less interactive than usual.  She is not ambulating and mostly stays in bed and does not want a leave her room now.  ED Course  Labs/Imaging on admission: I  have personally reviewed following labs and imaging studies.  Initial vitals showed BP 135/75, pulse 113, RR 36, temp 101.7 F, SpO2 97% on 4 L O2 via [place].  Labs showed WBC 11.0, hemoglobin 12.8, platelets 308, sodium 139, potassium 3.6, bicarb 26, BUN 14, creatinine 0.77, serum glucose 119, LFTs within normal limits, CK 194, BNP 121.3, lactic acid 1.4.  UA negative for UTI.  Blood cultures in process.  Portable chest x-ray negative for focal consolidation, edema, effusion.  Patient was given IV vancomycin  and aztreonam .  The hospitalist service was consulted to admit.  Review of Systems: Patient unable to provide history due to advanced dementia.  Past Medical History:  Diagnosis Date   Abnormal Pap smear    Chronic pain syndrome    GERD (gastroesophageal reflux disease)    Heart murmur    pt and pt's sig other denies   Herpes simplex without mention of complication    HSV 2    Left hip pain    Major neurocognitive disorder due to Alzheimer's disease 01/15/2021   Migraine    Osteopenia    in neck   Osteoporosis    Pure hypercholesterolemia    PVC (premature ventricular contraction)    Thyroid disease     Past Surgical History:  Procedure Laterality Date   APPENDECTOMY     FOOT SURGERY Left    LUMBAR LAMINECTOMY/DECOMPRESSION MICRODISCECTOMY N/A 09/12/2021   Procedure: LUMBAR 5- SACRUM 1 DECOMPRESSION;  Surgeon: Beuford Anes, MD;  Location: [facility] OR;  Service: Orthopedics;  Laterality: N/A;   WRIST FRACTURE SURGERY Left    metal in left  wrist    Social History: Social History   Tobacco Use   Smoking status: Never   Smokeless tobacco: Never  Vaping Use   Vaping status: Never Used  Substance Use Topics   Alcohol use: Yes    Alcohol/week: 1.0 standard drink of alcohol    Types: 1 Glasses of wine per week    Comment: not every week   Drug use: No   Allergies  Allergen Reactions   Egg-Derived Products Other (See Comments)    Husband unaware of intolerance Not  listed on the [facility]   Other     Oats,poppyseeds,yeast,baker and brewers-Husband unaware of intolerance Not listed on the [facility]   Penicillins Other (See Comments)    Unknown reaction   Tetracyclines & Related Other (See Comments)    Unknown reaction    Family History  Problem Relation Age of Onset   Emphysema Mother    Lung cancer Mother    Kidney failure Mother    Bipolar disorder Mother    Interstitial cystitis Mother    Prostate cancer Father    Alzheimer's disease Father    Liver disease Sister        hep c    Pleurisy Maternal Grandmother      Prior to Admission medications   Medication Sig Start Date End Date Taking? Authorizing Provider  acetaminophen  (TYLENOL ) 325 MG tablet Take 2 tablets (650 mg total) by mouth 3 (three) times daily. 08/01/23   Gonfa, Taye T, MD  busPIRone (BUSPAR) 7.5 MG tablet Take 7.5 mg by mouth 2 (two) times daily. 06/25/23   [provider]  calcium carbonate (TUMS - DOSED IN MG ELEMENTAL CALCIUM) 500 MG chewable tablet Chew 1 tablet by mouth every 6 (six) hours as needed for indigestion or heartburn.    [provider]  Cholecalciferol (VITAMIN D3) 125 MCG (5000 UT) TABS Take 5,000 Units by mouth in the morning.    [provider]  divalproex  (DEPAKOTE  SPRINKLE) 125 MG capsule Take 125 mg by mouth 2 (two) times daily. 07/08/23   [provider]  donepezil  (ARICEPT ) 10 MG tablet Take 1 tablet (10 mg total) by mouth at bedtime. 07/24/22   Wertman, Sara E, PA-C  loperamide (IMODIUM A-D) 2 MG tablet Take 1 tablet by mouth every 6 (six) hours as needed for diarrhea or loose stools. 04/30/23   [provider]  memantine  (NAMENDA ) 10 MG tablet Take 1 tablet   twice a day 07/24/22   Wertman, Sara E, PA-C  pantoprazole  (PROTONIX ) 40 MG tablet Take 40 mg by mouth daily. 06/18/23   [provider]  vitamin B-12 (CYANOCOBALAMIN ) 1000 MCG tablet Take 1,000 mcg by mouth in the morning.    [provider]     Physical Exam: Vitals:   08/02/23 2256 08/02/23 2300 08/02/23 2330 08/03/23 0000  BP:  103/72 114/74 (!) 111/58  Pulse:  60 (!) 103 (!) 110  Resp:  (!) 46 (!) 45 (!) 38  Temp:      TempSrc:      SpO2: 95% 94% 100% 100%  Weight:      Height:       Constitutional: Chronically ill-appearing woman resting in bed with head elevated, keeps eyes closed, minimally following commands, not verbally interactive Eyes:  lids and conjunctivae normal ENMT: Mucous membranes are moist. Posterior pharynx clear of any exudate or lesions. Neck: normal, supple, no masses. Respiratory: Coarse breath sounds, frequent coughing fits.  Increased respiratory effort while on 6 L  O2 via [place]. No accessory muscle use.  Cardiovascular: Tachycardic, no murmurs / rubs / gallops. No extremity edema. 2+ pedal pulses. Abdomen: no obvious tenderness on palpation, no masses palpated. Musculoskeletal: no clubbing / cyanosis. No joint deformity upper and lower extremities.  Skin: no rashes, lesions, ulcers. No induration Neurologic: Sensation appears intact.  Wiggles toes on commands otherwise not really follow commands. Psychiatric: Dementia  EKG: Personally reviewed. Sinus tachycardia, rate 108, no acute ischemic changes.  Similar to previous.  Assessment/Plan Principal Problem:   Sepsis (HCC) Active Problems:   Pure hypercholesterolemia   Major neurocognitive disorder due to Alzheimer's disease   Acute respiratory failure with hypoxia [facility])   Kristine Stafford is a 73 y.o. female with medical history significant for advanced Alzheimer's dementia, chronic pain syndrome, hypothyroidism, migraines, HLD, GERD who is admitted with sepsis and acute hypoxic respiratory failure.  Assessment and Plan: Sepsis and acute hypoxic respiratory failure due to suspected aspiration event: Patient presenting with fever, tachycardia, tachypnea.  SpO2 was 88% on RA PTA.  Requiring 6 L O2 via [place] at time of admission.  Given history  there is suspicion for aspiration event although CXR unrevealing. - Repeat CXR in a.m. - Continue IV vancomycin , cefepime, Flagyl - Keep n.p.o. - HOB 30 degrees, aspiration precautions - Gentle IV fluid hydration overnight  Advanced Alzheimer's dementia: Holding home Aricept , Namenda , Depakote  while NPO.  Hyperlipidemia: Crestor discontinued on recent admission due to rhabdomyolysis.  Goals of care: Discussed with patient's husband at time of admission.  He confirms CODE STATUS is DNR/DNI.  If patient decompensates we will focus on comfort care measures only.  Okay to continue with current management including IV antibiotics, IV fluids, noninvasive measures for now.   DVT prophylaxis: enoxaparin  (LOVENOX ) injection 40 mg Start: 08/03/23 1000 Code Status:   Code Status: Do not attempt resuscitation (DNR) - Comfort care confirmed with patient's husband on admission. Family Communication: Husband at bedside Disposition Plan: From [facility], dispo pending clinical progress Consults called: None Severity of Illness: The appropriate patient status for this patient is INPATIENT. Inpatient status is judged to be reasonable and necessary in order to provide the required intensity of service to ensure the patient's safety. The patient's presenting symptoms, physical exam findings, and initial radiographic and laboratory data in the context of their chronic comorbidities is felt to place them at high risk for further clinical deterioration. Furthermore, it is not anticipated that the patient will be medically stable for discharge from the hospital within 2 midnights of admission.   * I certify that at the point of admission it is my clinical judgment that the patient will require inpatient hospital care spanning beyond 2 midnights from the point of admission due to high intensity of service, high risk for further deterioration and high frequency of surveillance required.DEWAINE Kristine Blanch  MD [facility]  If 7PM-7AM, please contact night-coverage [email or web address]  08/03/2023, 12:50 AM

## 2023-08-02 NOTE — ED Notes (Signed)
 1st lac 1.4 within normal range, 2nd not needed can be discontinued

## 2023-08-03 ENCOUNTER — Other Ambulatory Visit: Payer: Self-pay

## 2023-08-03 ENCOUNTER — Inpatient Hospital Stay (HOSPITAL_COMMUNITY)

## 2023-08-03 DIAGNOSIS — J690 Pneumonitis due to inhalation of food and vomit: Secondary | ICD-10-CM | POA: Diagnosis present

## 2023-08-03 DIAGNOSIS — L89321 Pressure ulcer of left buttock, stage 1: Secondary | ICD-10-CM | POA: Diagnosis present

## 2023-08-03 DIAGNOSIS — J949 Pleural condition, unspecified: Secondary | ICD-10-CM | POA: Diagnosis not present

## 2023-08-03 DIAGNOSIS — F0280 Dementia in other diseases classified elsewhere without behavioral disturbance: Secondary | ICD-10-CM | POA: Diagnosis present

## 2023-08-03 DIAGNOSIS — Z880 Allergy status to penicillin: Secondary | ICD-10-CM | POA: Diagnosis not present

## 2023-08-03 DIAGNOSIS — R918 Other nonspecific abnormal finding of lung field: Secondary | ICD-10-CM | POA: Diagnosis not present

## 2023-08-03 DIAGNOSIS — E7800 Pure hypercholesterolemia, unspecified: Secondary | ICD-10-CM | POA: Diagnosis present

## 2023-08-03 DIAGNOSIS — R627 Adult failure to thrive: Secondary | ICD-10-CM | POA: Diagnosis present

## 2023-08-03 DIAGNOSIS — R1310 Dysphagia, unspecified: Secondary | ICD-10-CM | POA: Diagnosis not present

## 2023-08-03 DIAGNOSIS — Z7189 Other specified counseling: Secondary | ICD-10-CM | POA: Diagnosis not present

## 2023-08-03 DIAGNOSIS — Z515 Encounter for palliative care: Secondary | ICD-10-CM | POA: Diagnosis not present

## 2023-08-03 DIAGNOSIS — G309 Alzheimer's disease, unspecified: Secondary | ICD-10-CM | POA: Diagnosis present

## 2023-08-03 DIAGNOSIS — R1311 Dysphagia, oral phase: Secondary | ICD-10-CM | POA: Diagnosis present

## 2023-08-03 DIAGNOSIS — K219 Gastro-esophageal reflux disease without esophagitis: Secondary | ICD-10-CM | POA: Diagnosis present

## 2023-08-03 DIAGNOSIS — Z7401 Bed confinement status: Secondary | ICD-10-CM | POA: Diagnosis not present

## 2023-08-03 DIAGNOSIS — J9601 Acute respiratory failure with hypoxia: Secondary | ICD-10-CM | POA: Diagnosis present

## 2023-08-03 DIAGNOSIS — L89312 Pressure ulcer of right buttock, stage 2: Secondary | ICD-10-CM | POA: Diagnosis present

## 2023-08-03 DIAGNOSIS — G894 Chronic pain syndrome: Secondary | ICD-10-CM

## 2023-08-03 DIAGNOSIS — A419 Sepsis, unspecified organism: Secondary | ICD-10-CM | POA: Diagnosis present

## 2023-08-03 DIAGNOSIS — J90 Pleural effusion, not elsewhere classified: Secondary | ICD-10-CM | POA: Diagnosis not present

## 2023-08-03 DIAGNOSIS — E039 Hypothyroidism, unspecified: Secondary | ICD-10-CM | POA: Diagnosis present

## 2023-08-03 DIAGNOSIS — J449 Chronic obstructive pulmonary disease, unspecified: Secondary | ICD-10-CM | POA: Diagnosis not present

## 2023-08-03 DIAGNOSIS — I959 Hypotension, unspecified: Secondary | ICD-10-CM | POA: Diagnosis present

## 2023-08-03 DIAGNOSIS — Z66 Do not resuscitate: Secondary | ICD-10-CM | POA: Diagnosis present

## 2023-08-03 LAB — PROCALCITONIN: Procalcitonin: <0.1 ng/mL

## 2023-08-03 LAB — TSH: TSH: 2.406 u[IU]/mL (ref 0.350–4.500)

## 2023-08-03 LAB — I-STAT CG4 LACTIC ACID, ED: Lactic Acid, Venous: 1.5 mmol/L (ref 0.5–1.9)

## 2023-08-03 MED ORDER — ONDANSETRON HCL 4 MG PO TABS
4.0000 mg | ORAL_TABLET | Freq: Four times a day (QID) | ORAL | Status: DC | PRN
Start: 2023-08-03 — End: 2023-08-06

## 2023-08-03 MED ORDER — SODIUM CHLORIDE 0.9 % IV SOLN: 2.0000 g | Freq: Three times a day (TID) | INTRAVENOUS | Status: DC

## 2023-08-03 MED ORDER — ONDANSETRON HCL 4 MG/2ML IJ SOLN: 4.0000 mg | Freq: Four times a day (QID) | INTRAMUSCULAR | Status: DC | PRN

## 2023-08-03 MED ORDER — ACETAMINOPHEN 650 MG RE SUPP: 650.0000 mg | Freq: Four times a day (QID) | RECTAL | Status: DC | PRN

## 2023-08-03 MED ORDER — SODIUM CHLORIDE 0.9% FLUSH
3.0000 mL | Freq: Two times a day (BID) | INTRAVENOUS | Status: DC
  Administered 2023-08-03 – 2023-08-05 (×4): 3 mL via INTRAVENOUS

## 2023-08-03 MED ORDER — ACETAMINOPHEN 10 MG/ML IV SOLN
1000.0000 mg | Freq: Once | INTRAVENOUS | Status: AC
  Administered 2023-08-03: 1000 mg via INTRAVENOUS
  Filled 2023-08-03: qty 100

## 2023-08-03 MED ORDER — SODIUM CHLORIDE 3 % IN NEBU
4.0000 mL | INHALATION_SOLUTION | Freq: Two times a day (BID) | RESPIRATORY_TRACT | Status: DC
  Administered 2023-08-03 – 2023-08-04 (×3): 4 mL via RESPIRATORY_TRACT
  Filled 2023-08-03 (×4): qty 4

## 2023-08-03 MED ORDER — ACETAMINOPHEN 325 MG PO TABS
650.0000 mg | ORAL_TABLET | Freq: Four times a day (QID) | ORAL | Status: DC | PRN
Start: 2023-08-03 — End: 2023-08-06

## 2023-08-03 MED ORDER — ALBUMIN HUMAN 25 % IV SOLN
50.0000 g | Freq: Once | INTRAVENOUS | Status: AC
  Administered 2023-08-03: 50 g via INTRAVENOUS
  Filled 2023-08-03: qty 200

## 2023-08-03 MED ORDER — VANCOMYCIN HCL 1250 MG/250ML IV SOLN
1250.0000 mg | INTRAVENOUS | Status: DC
  Administered 2023-08-03: 1250 mg via INTRAVENOUS
  Filled 2023-08-03 (×2): qty 250

## 2023-08-03 MED ORDER — ENOXAPARIN SODIUM 40 MG/0.4ML IJ SOSY
40.0000 mg | PREFILLED_SYRINGE | INTRAMUSCULAR | Status: DC
  Administered 2023-08-03 – 2023-08-05 (×2): 40 mg via SUBCUTANEOUS
  Filled 2023-08-03 (×2): qty 0.4

## 2023-08-03 MED ORDER — ALBUMIN HUMAN 25 % IV SOLN
25.0000 g | Freq: Four times a day (QID) | INTRAVENOUS | Status: AC
  Administered 2023-08-03 – 2023-08-05 (×8): 25 g via INTRAVENOUS
  Filled 2023-08-03 (×8): qty 100

## 2023-08-03 MED ORDER — LACTATED RINGERS IV SOLN: INTRAVENOUS | Status: AC

## 2023-08-03 MED ORDER — SODIUM CHLORIDE 0.9 % IV BOLUS
1000.0000 mL | Freq: Once | INTRAVENOUS | Status: AC
  Administered 2023-08-03: 1000 mL via INTRAVENOUS

## 2023-08-03 MED ORDER — ACETAMINOPHEN 325 MG PO TABS
650.0000 mg | ORAL_TABLET | Freq: Three times a day (TID) | ORAL | Status: DC
  Filled 2023-08-03: qty 2

## 2023-08-03 MED ORDER — SODIUM CHLORIDE 0.9 % IV SOLN
2.0000 g | Freq: Three times a day (TID) | INTRAVENOUS | Status: DC
  Administered 2023-08-03 – 2023-08-04 (×5): 2 g via INTRAVENOUS
  Filled 2023-08-03 (×5): qty 12.5

## 2023-08-03 MED ORDER — METRONIDAZOLE 500 MG/100ML IV SOLN
500.0000 mg | Freq: Two times a day (BID) | INTRAVENOUS | Status: DC
  Administered 2023-08-03 – 2023-08-04 (×4): 500 mg via INTRAVENOUS
  Filled 2023-08-03 (×4): qty 100

## 2023-08-03 MED ORDER — LACTATED RINGERS IV BOLUS
250.0000 mL | Freq: Once | INTRAVENOUS | Status: AC
  Administered 2023-08-03: 250 mL via INTRAVENOUS

## 2023-08-03 MED ORDER — SODIUM CHLORIDE 0.9 % IV BOLUS
500.0000 mL | Freq: Once | INTRAVENOUS | Status: AC
  Administered 2023-08-03: 500 mL via INTRAVENOUS

## 2023-08-03 MED ORDER — SENNOSIDES-DOCUSATE SODIUM 8.6-50 MG PO TABS: 1.0000 | ORAL_TABLET | Freq: Every evening | ORAL | Status: DC | PRN

## 2023-08-03 NOTE — ED Notes (Signed)
 This RN spoke with [facility] floor coverage MD.  Discussed pt BP, current condition.  Orders received for BP.

## 2023-08-03 NOTE — ED Notes (Addendum)
 Pt placed in clean and dry brief by this RN and ER tech. Pt resting with eyes closed. Pt also given new warm blankets for comfort. IV infusing with no s/s of infiltration. Pt shows no acute s/s of distress at this time.

## 2023-08-03 NOTE — Assessment & Plan Note (Signed)
-   overall looks like FTT and she's been progressively declining - just admitted for polypharmacy and oversedation but even after meds adjusted still very disengaged and now with dysphagia, this may start to play more of a role in prognosis - needs SLP eval and her SO will then decide on next steps; I recommended against PEG tube given the data in advanced dementia patient and he seems to agree - She has been started on essentially pleasure feeding with floor stock clear liquids and ice cream -Significant other planning on hospice at discharge either back at her facility or going to potentially residential hospice

## 2023-08-03 NOTE — Progress Notes (Signed)
 Clinical/Bedside Swallow Evaluation Patient Details  Name: Kristine Stafford MRN: [id number] Date of Birth: September 07, 1950  Today's Date: 08/03/2023 Time: SLP Start Time (ACUTE ONLY): 1040 SLP Stop Time (ACUTE ONLY): 1119 SLP Time Calculation (min) (ACUTE ONLY): 39 min  Past Medical History:  Past Medical History:  Diagnosis Date   Abnormal Pap smear    Chronic pain syndrome    GERD (gastroesophageal reflux disease)    Heart murmur    pt and pt's sig other denies   Herpes simplex without mention of complication    HSV 2    Left hip pain    Major neurocognitive disorder due to Alzheimer's disease 01/15/2021   Migraine    Osteopenia    in neck   Osteoporosis    Pure hypercholesterolemia    PVC (premature ventricular contraction)    Thyroid disease    Past Surgical History:  Past Surgical History:  Procedure Laterality Date   APPENDECTOMY     FOOT SURGERY Left    LUMBAR LAMINECTOMY/DECOMPRESSION MICRODISCECTOMY N/A 09/12/2021   Procedure: LUMBAR 5- SACRUM 1 DECOMPRESSION;  Surgeon: Beuford Anes, MD;  Location: [facility] OR;  Service: Orthopedics;  Laterality: N/A;   WRIST FRACTURE SURGERY Left    metal in left wrist   HPI:  Patient is a 73-year-o female admitted with AMS -concern for aspiration due to patient coughing with intake.  Chest x-ray showed progressive left pleural effusion and left basilar airspace disease.  Infection is considered..  Pt with PMH + for Alzheimer's disease with recent admission [id number] with altered mental status due to dehydration and potentially medication side effects.  Spouse reports patient now leaning to the left and drooling.  He also endorses that she is not sometimes but does not eat at all of others.    Assessment / Plan / Recommendation  Clinical Impression  Pt greeted upright in bed with spouse at bedside.  RN assisted for optimal positioning.  Initially she was alert but later did not participate as much initially. However she would nod her head  "yes" when informed she was a great woman.  Pt slightly leaning to the left with some drooling.  Orally suctioned her and provided her with po trials of gingerale via tsp, tip of straw *straw placed in cup with liquid and sealed at external tip to allow improved placement of liquid in oral cavity. Pt however still did not swallow ice cream, gingerale or water - as evidenced by lack of voice box elevation.  SLP had extensive conversation with husband about her likely not meeting nutritional needs and SLP role to mitigate aspiration not prevent it.  Small amounts for comfort when fully alert and willing to consume po reviewed - advised this was not for hydration/nutrition.  He observed SlP placing pt fully upright and providing oral suction when she did not swallow and was willing to this care plan.  He questioned dc plans - and SlP ?s if pt may qualify to dc to a 'hospice house' like [facility] if she does not eat/drink at all.  He seemed open to this option, if available - as pt's mom was at hospice house last few days of her life. He reports understanding to orally suction pt if she does not swallow and to only provide po via tsp/tip of straw if fully alert and wanting po - stopping po if pt coughing. From review of pt's spouse detailing her coughing with liquids at facility, sounds as if she was holding orally and spilling thin  provided via straw into airway due to poor awareness/mentation at facility. He reports she has been having po on and off and somedays won't eat at all.  SLP mentioned if she pt's po does improve, this will recurrently happen due to her dementia.   Discussed coughing with intake not being comfortable and likely indicating aspiration. Pt on board for goal of comfort po - only when fully alert/tolerating and via tsp or tip of straw only.   Extensive education completed with pt's spouse and swallow precaution sign posted. SLP Visit Diagnosis: Dysphagia, oral phase (R13.11);Dysphagia, unspecified  (R13.10)    Aspiration Risk  Severe aspiration risk;Risk for inadequate nutrition/hydration    Diet Recommendation Thin liquid (clear and icecream via floor stock)    Liquid Administration via: Spoon;Other (Comment) (tip of straw) Medication Administration: Via alternative means Supervision: Full supervision/cueing for compensatory strategies;Comment (spouse assisting is ok) Compensations: Slow rate;Small sips/bites (oral suction if pt does not swallow) Postural Changes: Seated upright at 90 degrees;Remain upright for at least 30 minutes after po intake    Other  Recommendations Recommended Consults: Other (Comment) (palliative) Oral Care Recommendations: Oral care BID Caregiver Recommendations: Have oral suction available     Assistance Recommended at Discharge    Functional Status Assessment Patient has had a recent decline in their functional status and/or demonstrates limited ability to make significant improvements in function in a reasonable and predictable amount of time  Frequency and Duration min 1 x/week  1 week       Prognosis Prognosis for improved oropharyngeal function: Guarded      Swallow Study   General Date of Onset: 08/03/23 HPI: Patient is a 73-year-o female admitted with AMS -concern for aspiration due to patient coughing with intake.  Chest x-ray showed progressive left pleural effusion and left basilar airspace disease.  Infection is considered..  Pt with PMH + for Alzheimer's disease with recent admission [phone] with altered mental status due to dehydration and potentially medication side effects.  Spouse reports patient now leaning to the left and drooling.  He also endorses that she is not sometimes but does not eat at all of others. Type of Study: Bedside Swallow Evaluation Previous Swallow Assessment: Bedside evaluation during prior hospital stay Diet Prior to this Study: NPO Temperature Spikes Noted: N/A Respiratory Status: Nasal  cannula Behavior/Cognition: Alert;Lethargic/Drowsy (varies) Oral Cavity Assessment: Excessive secretions Oral Care Completed by SLP: No Oral Cavity - Dentition: Adequate natural dentition Self-Feeding Abilities: Total assist Patient Positioning: Upright in bed Baseline Vocal Quality:  (pt did not verbalize) Volitional Cough: Cognitively unable to elicit Volitional Swallow: Unable to elicit    Oral/Motor/Sensory Function Overall Oral Motor/Sensory Function:  (limited due to pt's AMS and decreased participation)   [place] chips: Not tested Presentation:  (due to risk)   Thin Liquid Thin Liquid: Impaired Presentation: Spoon (tip of straw -) Oral Phase Impairments: Reduced labial seal;Poor awareness of bolus Oral Phase Functional Implications: Left anterior spillage;Oral residue Pharyngeal  Phase Impairments: Other (comments) (no swallow triggered)    Nectar Thick Nectar Thick Liquid: Not tested   Honey Thick Honey Thick Liquid: Not tested   Puree Puree: Impaired Presentation: Spoon Oral Phase Impairments: Reduced labial seal;Poor awareness of bolus;Reduced lingual movement/coordination Oral Phase Functional Implications: Left anterior spillage;Oral holding   Solid     Solid: Not tested      Nicolas Emmie Caldron 08/03/2023,12:18 PM  Madelin POUR, MS [facility] SLP Acute Rehab Services Office [phone]

## 2023-08-03 NOTE — ED Notes (Signed)
 This RN notified MD that pt BP is trending softer.  MD ordered increase in maintenance fluids and a bolus of fluid

## 2023-08-03 NOTE — Progress Notes (Signed)
 PHARMACY - PHYSICIAN COMMUNICATION CRITICAL VALUE ALERT - BLOOD CULTURE IDENTIFICATION (BCID)  Kristine Stafford is an 73 y.o. female who presented to [facility] on 08/02/2023 with a chief complaint of shortness of breath.  Assessment:  1/4 blood cultures from 6/16 grew MRSE, presumed to be contaminant. Now 1/4 blood cultures from 6/21 with GPCs on gram stain. Per micro lab, will not run BCID on this because same morphology within past 60 days.  Name of physician (or Provider) Contacted: Dr. Lawence  Current antibiotics: vancomycin , cefepime and flagyl  Changes to prescribed antibiotics recommended:  Patient is on recommended antibiotics - No changes needed  Results for orders placed or performed during the hospital encounter of 07/28/23  Blood Culture ID Panel (Reflexed) (Collected: 07/28/2023  5:49 PM)  Result Value Ref Range   Enterococcus faecalis NOT DETECTED NOT DETECTED   Enterococcus Faecium NOT DETECTED NOT DETECTED   Listeria monocytogenes NOT DETECTED NOT DETECTED   Staphylococcus species DETECTED (A) NOT DETECTED   Staphylococcus aureus (BCID) NOT DETECTED NOT DETECTED   Staphylococcus epidermidis DETECTED (A) NOT DETECTED   Staphylococcus lugdunensis NOT DETECTED NOT DETECTED   Streptococcus species NOT DETECTED NOT DETECTED   Streptococcus agalactiae NOT DETECTED NOT DETECTED   Streptococcus pneumoniae NOT DETECTED NOT DETECTED   Streptococcus pyogenes NOT DETECTED NOT DETECTED   A.calcoaceticus-baumannii NOT DETECTED NOT DETECTED   Bacteroides fragilis NOT DETECTED NOT DETECTED   Enterobacterales NOT DETECTED NOT DETECTED   Enterobacter cloacae complex NOT DETECTED NOT DETECTED   Escherichia coli NOT DETECTED NOT DETECTED   Klebsiella aerogenes NOT DETECTED NOT DETECTED   Klebsiella oxytoca NOT DETECTED NOT DETECTED   Klebsiella pneumoniae NOT DETECTED NOT DETECTED   Proteus species NOT DETECTED NOT DETECTED   Salmonella species NOT DETECTED NOT DETECTED   Serratia  marcescens NOT DETECTED NOT DETECTED   Haemophilus influenzae NOT DETECTED NOT DETECTED   Neisseria meningitidis NOT DETECTED NOT DETECTED   Pseudomonas aeruginosa NOT DETECTED NOT DETECTED   Stenotrophomonas maltophilia NOT DETECTED NOT DETECTED   Candida albicans NOT DETECTED NOT DETECTED   Candida auris NOT DETECTED NOT DETECTED   Candida glabrata NOT DETECTED NOT DETECTED   Candida krusei NOT DETECTED NOT DETECTED   Candida parapsilosis NOT DETECTED NOT DETECTED   Candida tropicalis NOT DETECTED NOT DETECTED   Cryptococcus neoformans/gattii NOT DETECTED NOT DETECTED   Methicillin resistance mecA/C DETECTED (A) NOT DETECTED    Rocky Slade, PharmD, BCPS 08/03/2023  7:46 PM

## 2023-08-03 NOTE — Assessment & Plan Note (Signed)
-   not on synthroid anymore? - will check TSH

## 2023-08-03 NOTE — Assessment & Plan Note (Signed)
-   statin discontinued last admission for rhabdo, but also no further mortality benefit at this time so would not resume regardless

## 2023-08-03 NOTE — Assessment & Plan Note (Signed)
-   see Alz as well - in general, she looks to be FTT at this time with now close readmission and inability to eat/dysphagia due to weakness and decline; meds previously adjusted and upon returning to memory care, still not thriving well - s/p SLP eval; essentially pleasure feeds at this time - we're heading towards either hospice at [facility] vs residential hospice [facility] etc) - palliative care also following

## 2023-08-03 NOTE — Consult Note (Cosign Needed)
 Palliative Care Consult Note                                  Date: 08/03/2023   Patient Name: Kristine Stafford  DOB: 1951/02/08  MRN: [id number]  Age / Sex: 73 y.o., female  PCP: Sun, Vyvyan, MD Referring Physician: Patsy Lenis, MD  Reason for Consultation: Establishing goals of care  HPI/Patient Profile: 73 y.o. female  with past medical history of advanced Alzheimer's dementia, chronic pain syndrome, hypothyroidism, migraines, HLD, and GERD who was admitted on 08/02/2023 with sepsis and acute hypoxic respiratory failure due to suspected aspiration.  She was recently hospitalized 6/16 - 6/20 for encephalopathy from polypharmacy (was on Norco, Robaxin , Ativan, melatonin, duloxetine, and pregabalin ).  These meds were held during that admission and not continued at discharge.  Upon returning to her memory care facility she had recurrent/ongoing significant lethargy to the point that she was coughing and having a hard time clearing secretions and unable to eat effectively.  Palliative Medicine has been consulted for goals of care discussions. Patient and family are faced with anticipatory care needs and complex medical decision making.   Clinical Assessment and Goals of Care:   Extensive chart review has been completed including labs, vital signs, imaging, progress/consult notes, orders, medications and available advance directive documents.    I met with his significant other/[name] at bedside in the ED to discuss diagnosis, prognosis, GOC, EOL wishes, disposition, and options.  I introduced Palliative Medicine as specialized medical care for people living with serious illness. It focuses on providing relief from the symptoms and stress of a serious illness.   Created space and opportunity for SO/[name] to express thoughts and feelings regarding patient's current medical situation. Values and goals of care were attempted to be elicited.  Signe shares  that he and patient have been together for 44+ years.  Patient  resides at [facility]  Discussion: We discussed patient's current illness and what it means in the larger context of her ongoing co-morbidities. Current clinical status was reviewed. Natural disease trajectory of advanced dementia was discussed.  SO/[name]  verbalizes understanding that patient is likely on a terminal trajectory.  The difference between full scope medical intervention and comfort care was considered.  We reviewed the concept of a comfort path as de-escalating medical interventions and allowing a natural course to occur. Discussed that the goal is comfort and dignity rather than cure/prolonging life.  We discussed option for transition to an inpatient hospice facility.   At this time, SO/[name] wishes to continue supportive interventions including IV fluids and antibiotics.  Discussed that if patient's  clinical condition continues to deteriorate, would recommend transition to full comfort care - to which Signe agrees.  Discussed the importance of continued conversation with the medical team regarding overall plan of care. Questions and concerns addressed.  Emotional support provided   Review of Systems  Unable to perform ROS   Objective:   Primary Diagnoses: Present on Admission:  Pure hypercholesterolemia  Major neurocognitive disorder due to Alzheimer's disease  Acute respiratory failure with hypoxia (HCC)  Sepsis (HCC)  GERD (gastroesophageal reflux disease)  Hypothyroidism  Chronic pain syndrome   Physical Exam Vitals reviewed.  Constitutional:      General: She is not in acute distress.    Appearance: She is ill-appearing.   Cardiovascular:     Rate and Rhythm: Tachycardia present.  Pulmonary:  Effort: No respiratory distress.   Neurological:     Mental Status: She is lethargic.     Palliative Assessment/Data: PPS 20%     Assessment & Plan:   SUMMARY OF RECOMMENDATIONS    Continue current supportive interventions If patient continues to decline, would recommend transition to comfort care Significant other/[name] would prefer patient to return to memory care facility with hospice support PMD will continue to follow  Primary Decision Maker: HCPOA Dellia  Existing Vynca/ACP Documentation: HCPOA document  naming Rutha Hummer as health care agent  Code Status/Advance Care Planning: DNR  Prognosis:  Unable to determine  Discharge Planning:  To Be Determined   Thank you for allowing us  to participate in the care of Ronal Cy Shams   Time Total: 75 minutes  Detailed review of medical records (labs, imaging, vital signs), medically appropriate exam, discussed with treatment team, counseling and education to patient, family, & staff, documenting clinical information, medication management, coordination of care.   Signed by: Recardo Loll, NP Palliative Medicine Team  Team Phone # [phone]  For individual providers, please see AMION

## 2023-08-03 NOTE — Hospital Course (Addendum)
 Kristine Stafford is a 73 y.o. female with medical history significant for advanced Alzheimer's dementia, chronic pain syndrome, hypothyroidism, migraines, HLD, GERD who is admitted with sepsis and acute hypoxic respiratory failure due to suspected aspiration.  She was also hospitalized recently from 6/16 - 6/20 for encephalopathy from polypharmacy (was on norco, robaxin , ativan, melatonin, duloxetine, and pregablin). These meds were held during that admission and not continued at discharge.   Upon returning to her memory care facility she had recurrent/ongoing significant lethargy to the point that she was coughing and having a hard time clearing her secretions and unable to eat effectively.  Her significant other also notes that her decline has been gradual but progressive. Oral intake was still adequate up until the last few weeks and she has been less interactive and remains in her room for longer times and does not engage much anymore.   CXR was obtained on admission and the day after. It showed concern for left effusion but couldn't rule out infection. Given the concern for aspiration based on history she was started on antibiotics and admitted for further monitoring and GOC discussions.   With ongoing discussions, decision was made for de-escalation of care and transitioning to hospice and comfort care.  DME was arranged and family request was for patient to return back to [facility] with hospice in place.  If she were to further decline or warrant needing to go to [facility], this was also considered an option.

## 2023-08-03 NOTE — Assessment & Plan Note (Signed)
-   in setting of suspected aspiration; does have small left effusion as well - continue O2 for now; I think we're approaching a hospice route and continuing O2 is reasonable as she may not be able to be weaned off, but can attempt

## 2023-08-03 NOTE — Assessment & Plan Note (Signed)
-  tylenol  scheduled and PRN only - taken off multiple pain/sedating meds last admission

## 2023-08-03 NOTE — Assessment & Plan Note (Signed)
-   fever, tachycardia, tachypnea, leukocytosis; suspected pulmonary source from aspiration - continue abx and monitor response - DNR confirmed; no CPR or intubation, but SO okay with IVF, abx. If she were to need pressors, I believe this would be a prognostic sign that she's not improving/thriving and we'd need to consider transition to comfort at that time - palliative care consulted to help continue GOC; her SO seems reasonable and understands she is not thriving well in general especially with readmission - s/p IVF; okay to complete albumin early; BP has improved - PCT negative - changing abx to rocephin/flagyl to cover for presumed aspiration; d/c abx after GOC discussions on 6/24 - After further GOC discussions, decision was made for returning back to [facility] with hospice in place

## 2023-08-03 NOTE — ED Notes (Signed)
 [facility] floor coverage paged by this RN.

## 2023-08-03 NOTE — Progress Notes (Signed)
 Pharmacy Antibiotic Note  Kristine Stafford is a 73 y.o. female admitted on 08/02/2023 with AMS and concerns for sepsis.  Pharmacy has been consulted for cefepime/vancomycin  dosing.  WBC 11, Tmax 101.7 Blood cultures collected Aztreonam  and Vanc load given in ED Has had cephalexin  in past will continue with cefepime  Plan: -Cefepime 2g IV every 8 hours (to start when next dose of aztreonam  would be) -Vancomycin  load IV x1 -Vancomycin  1250mg  IV every 24 hours (AUC 478, Vd 0.72, IBW, sCr 0.8) -Monitor renal function -Follow up signs of clinical improvement, LOT, de-escalation of antibiotics   Height: 5' 7 (170.2 cm) Weight: 67.2 kg (148 lb 2.4 oz) IBW/kg (Calculated) : 61.6  Temp (24hrs), Avg:100.2 F (37.9 C), Min:98.6 F (37 C), Max:101.7 F (38.7 C)  Recent Labs  Lab 07/28/23 1722 07/28/23 1739 07/29/23 0818 07/30/23 0619 07/31/23 0629 08/01/23 0500 08/02/23 2109 08/02/23 2140 08/03/23 0005  WBC 12.1*  --  8.5 6.3 7.5  --  11.0*  --   --   CREATININE 0.72  --  0.60 0.61 0.63 0.59 0.77  --   --   LATICACIDVEN  --  1.7  --   --   --   --   --  1.4 1.5    Estimated Creatinine Clearance: 60 mL/min (by C-G formula based on SCr of 0.77 mg/dL).    Allergies  Allergen Reactions   Egg-Derived Products Other (See Comments)    Husband unaware of intolerance Not listed on the [facility]   Other     Oats,poppyseeds,yeast,baker and brewers-Husband unaware of intolerance Not listed on the [facility]   Penicillins Other (See Comments)    Unknown reaction   Tetracyclines & Related Other (See Comments)    Unknown reaction    Antimicrobials this admission: Cefepime 6/22 >>  Vancomycin  6/21 >>   Microbiology results: 6/21 BCx:   Thank you for allowing pharmacy to be a part of this patient's care.  Lynwood Poplar, PharmD, BCPS Clinical Pharmacist 08/03/2023 12:53 AM

## 2023-08-03 NOTE — Assessment & Plan Note (Addendum)
-   needs SLP eval and her SO will then decide on next steps; I recommended against PEG tube given the data in advanced dementia patient and he seems to agree - keep NPO for now - Discussed plan with SLP as well.  We will allow her to have some clears and ice cream.  SO agrees and understands the risks - IVF/albumin too

## 2023-08-03 NOTE — Progress Notes (Signed)
 Progress Note    Kristine Stafford   FMW:[id number]  DOB: 1951-02-04  DOA: 08/02/2023     0 PCP: Sun, Vyvyan, MD  Initial CC: SOB  Hospital Course: Kristine Stafford is a 73 y.o. female with medical history significant for advanced Alzheimer's dementia, chronic pain syndrome, hypothyroidism, migraines, HLD, GERD who is admitted with sepsis and acute hypoxic respiratory failure due to suspected aspiration.  She was also hospitalized recently from 6/16 - 6/20 for encephalopathy from polypharmacy (was on norco, robaxin , ativan, melatonin, duloxetine, and pregablin). These meds were held during that admission and not continued at discharge.   Upon returning to her memory care facility she had recurrent/ongoing significant lethargy to the point that she was coughing and having a hard time clearing her secretions and unable to eat effectively.  Her significant other also notes that her decline has been gradual but progressive. Oral intake was still adequate up until the last few weeks and she has been less interactive and remains in her room for longer times and does not engage much anymore.   CXR was obtained on admission and the day after. It showed concern for left effusion but couldn't rule out infection. Given the concern for aspiration based on history she was started on antibiotics and admitted for further monitoring and GOC discussions.   Interval History:  Seen this morning in the ER with significant other bedside.  We had a long discussion that her dementia seems progressive and she has been declining more recently.  Upon returning to memory care facility, she did not thrive well, has not been eating, and having difficulty with swallowing.  Concern for aspiration on admission.  Speech therapy has also met with him this morning and decision has been made for allowing some liquids and ice cream understanding risk of aspiration and that she may continue to decline regardless. It seems at  this point, we may be transitioning to comfort care soon and possibly pursuing residential hospice placement if she qualifies.  Assessment and Plan: * Sepsis (HCC) - fever, tachycardia, tachypnea, leukocytosis; suspected pulmonary source from aspiration - continue abx and monitor response - DNR confirmed; no CPR or intubation, but SO okay with IVF, abx. If she were to need pressors, I believe this would be a prognostic sign that she's not improving/thriving and we'd need to consider transition to comfort at that time - palliative care consulted to help continue GOC; her SO seems reasonable and understands she is not thriving well in general especially with readmission - continue IVF and albumin for now - check and trend PCT  Dysphagia - needs SLP eval and her SO will then decide on next steps; I recommended against PEG tube given the data in advanced dementia patient and he seems to agree - keep NPO for now - Discussed plan with SLP as well.  We will allow her to have some clears and ice cream.  SO agrees and understands the risks - IVF/albumin too  Goals of care, counseling/discussion - see Alz as well - in general, she looks to be FTT at this time with now close readmission and inability to eat/dysphagia due to weakness and decline; meds previously adjusted and upon returning to memory care, still not thriving well - NPO until SLP eval; will then have to decide if safe for diet versus would recommend comfort care at that time. I do not recommend feeding tube if she does not pass SLP eval and discussed this with her SO who  seems to be in agreement as well - palliative care also consulted to help with discussions and next steps; depending on course, she might also be worth considering for residential hospice if that becomes necessary   Major neurocognitive disorder due to Alzheimer's disease - overall looks like FTT and she's been progressively declining - just admitted for polypharmacy and  oversedation but even after meds adjusted still very disengaged and now with dysphagia, this may start to play more of a role in prognosis - needs SLP eval and her SO will then decide on next steps; I recommended against PEG tube given the data in advanced dementia patient and he seems to agree  Acute respiratory failure with hypoxia (HCC) - in setting of suspected aspiration; does have small left effusion as well - continue O2 for now; I think we're approaching a hospice route and continuing O2 is reasonable as she may not be able to be weaned off, but can attempt  Hypothyroidism - not on synthroid anymore? - will check TSH  Pure hypercholesterolemia - statin discontinued last admission for rhabdo, but also no further mortality benefit at this time so would not resume regardless  Chronic pain syndrome -tylenol  scheduled and PRN only - taken off multiple pain/sedating meds last admission    Old records reviewed in assessment of this patient  Antimicrobials: Aztreonam  08/02/2023 x 1 Cefepime 08/03/2023 >> current Vancomycin  08/02/2023 >> current Flagyl 08/03/2023 >> current  DVT prophylaxis:  enoxaparin  (LOVENOX ) injection 40 mg Start: 08/03/23 1000   Code Status:   Code Status: Do not attempt resuscitation (DNR) - Comfort care  Mobility Assessment (Last 72 Hours)     Mobility Assessment   No documentation.     Barriers to discharge: None Disposition Plan: Possibly residential hospice Status is: Inpatient  Objective: Blood pressure 114/75, pulse (!) 59, temperature 98.9 F (37.2 C), temperature source Oral, resp. rate (!) 27, height 5' 7 (1.702 m), weight 67.2 kg, SpO2 100%.  Examination:  Physical Exam Constitutional:      Comments: Chronically ill-appearing frail elderly woman nonverbal laying in bed in no distress  HENT:     Head: Normocephalic and atraumatic.     Mouth/Throat:     Mouth: Mucous membranes are dry.   Eyes:     Extraocular Movements: Extraocular  movements intact.    Cardiovascular:     Rate and Rhythm: Normal rate and regular rhythm.  Pulmonary:     Comments: Coarse breath sounds bilaterally Abdominal:     General: There is no distension.     Palpations: Abdomen is soft.     Tenderness: There is no abdominal tenderness.   Musculoskeletal:        General: No swelling. Normal range of motion.     Cervical back: Normal range of motion and neck supple.   Skin:    General: Skin is warm and dry.   Neurological:     Comments: Nonverbal.  Baseline dementia appreciated.  Follows some commands with upper extremities     Consultants:    Procedures:    Data Reviewed: Results for orders placed or performed during the hospital encounter of 08/02/23 (from the past 24 hours)  Comprehensive metabolic panel     Status: Abnormal   Collection Time: 08/02/23  9:09 PM  Result Value Ref Range   Sodium 139 135 - 145 mmol/L   Potassium 3.6 3.5 - 5.1 mmol/L   Chloride 103 98 - 111 mmol/L   CO2 26 22 - 32 mmol/L  Glucose, Bld 119 (H) 70 - 99 mg/dL   BUN 14 8 - 23 mg/dL   Creatinine, Ser 9.22 0.44 - 1.00 mg/dL   Calcium 9.2 8.9 - 89.6 mg/dL   Total Protein 6.4 (L) 6.5 - 8.1 g/dL   Albumin 2.9 (L) 3.5 - 5.0 g/dL   AST 21 15 - 41 U/L   ALT 19 0 - 44 U/L   Alkaline Phosphatase 47 38 - 126 U/L   Total Bilirubin 0.4 0.0 - 1.2 mg/dL   GFR, Estimated >39 >39 mL/min   Anion gap 10 5 - 15  CBC with Differential     Status: Abnormal   Collection Time: 08/02/23  9:09 PM  Result Value Ref Range   WBC 11.0 (H) 4.0 - 10.5 K/uL   RBC 4.47 3.87 - 5.11 MIL/uL   Hemoglobin 12.8 12.0 - 15.0 g/dL   HCT 59.8 63.9 - 53.9 %   MCV 89.7 80.0 - 100.0 fL   MCH 28.6 26.0 - 34.0 pg   MCHC 31.9 30.0 - 36.0 g/dL   RDW 85.7 88.4 - 84.4 %   Platelets 308 150 - 400 K/uL   nRBC 0.0 0.0 - 0.2 %   Neutrophils Relative % 83 %   Neutro Abs 9.0 (H) 1.7 - 7.7 K/uL   Lymphocytes Relative 9 %   Lymphs Abs 1.0 0.7 - 4.0 K/uL   Monocytes Relative 7 %    Monocytes Absolute 0.8 0.1 - 1.0 K/uL   Eosinophils Relative 1 %   Eosinophils Absolute 0.1 0.0 - 0.5 K/uL   Basophils Relative 0 %   Basophils Absolute 0.0 0.0 - 0.1 K/uL   Immature Granulocytes 0 %   Abs Immature Granulocytes 0.03 0.00 - 0.07 K/uL  Protime-INR     Status: None   Collection Time: 08/02/23  9:09 PM  Result Value Ref Range   Prothrombin Time 13.7 11.4 - 15.2 seconds   INR 1.0 0.8 - 1.2  CK     Status: None   Collection Time: 08/02/23  9:09 PM  Result Value Ref Range   Total CK 194 38 - 234 U/L  Brain natriuretic peptide     Status: Abnormal   Collection Time: 08/02/23  9:09 PM  Result Value Ref Range   B Natriuretic Peptide 121.3 (H) 0.0 - 100.0 pg/mL  Culture, blood (Routine x 2)     Status: None (Preliminary result)   Collection Time: 08/02/23  9:14 PM   Specimen: BLOOD  Result Value Ref Range   Specimen Description BLOOD RIGHT ANTECUBITAL    Special Requests      BOTTLES DRAWN AEROBIC AND ANAEROBIC Blood Culture results may not be optimal due to an inadequate volume of blood received in culture bottles   Culture      NO GROWTH < 12 HOURS Performed at [facility], [address], [place], [place] [address]    Report Status PENDING   I-Stat Lactic Acid, ED     Status: None   Collection Time: 08/02/23  9:40 PM  Result Value Ref Range   Lactic Acid, Venous 1.4 0.5 - 1.9 mmol/L  Culture, blood (Routine x 2)     Status: None (Preliminary result)   Collection Time: 08/02/23  9:42 PM   Specimen: BLOOD RIGHT HAND  Result Value Ref Range   Specimen Description BLOOD RIGHT HAND    Special Requests      BOTTLES DRAWN AEROBIC AND ANAEROBIC Blood Culture results may not be optimal  due to an inadequate volume of blood received in culture bottles   Culture      NO GROWTH < 12 HOURS Performed at [facility], [address] [place], [place] [address]    Report Status PENDING   Urinalysis, w/ Reflex to Culture (Infection Suspected) -Urine, Clean Catch      Status: Abnormal   Collection Time: 08/02/23 11:10 PM  Result Value Ref Range   Specimen Source URINE, CATHETERIZED    Color, Urine YELLOW YELLOW   APPearance CLEAR CLEAR   Specific Gravity, Urine 1.024 1.005 - 1.030   pH 5.0 5.0 - 8.0   Glucose, UA NEGATIVE NEGATIVE mg/dL   Hgb urine dipstick NEGATIVE NEGATIVE   Bilirubin Urine NEGATIVE NEGATIVE   Ketones, ur 5 (A) NEGATIVE mg/dL   Protein, ur NEGATIVE NEGATIVE mg/dL   Nitrite NEGATIVE NEGATIVE   Leukocytes,Ua NEGATIVE NEGATIVE   RBC / HPF 0-5 0 - 5 RBC/hpf   WBC, UA 0-5 0 - 5 WBC/hpf   Bacteria, UA NONE SEEN NONE SEEN   Squamous Epithelial / HPF 0-5 0 - 5 /HPF   Mucus PRESENT   I-Stat Lactic Acid, ED     Status: None   Collection Time: 08/03/23 12:05 AM  Result Value Ref Range   Lactic Acid, Venous 1.5 0.5 - 1.9 mmol/L    I have reviewed pertinent nursing notes, vitals, labs, and images as necessary. I have ordered labwork to follow up on as indicated.  I have reviewed the last notes from staff over past 24 hours. I have discussed patient's care plan and test results with nursing staff, CM/SW, and other staff as appropriate.  Time spent: Greater than 50% of the 55 minute visit was spent in counseling/coordination of care for the patient as laid out in the A&P.   LOS: 0 days   Alm Apo, MD [facility] 08/03/2023, 11:45 AM

## 2023-08-04 DIAGNOSIS — A419 Sepsis, unspecified organism: Secondary | ICD-10-CM | POA: Diagnosis not present

## 2023-08-04 DIAGNOSIS — J9601 Acute respiratory failure with hypoxia: Secondary | ICD-10-CM | POA: Diagnosis not present

## 2023-08-04 DIAGNOSIS — L8990 Pressure ulcer of unspecified site, unspecified stage: Secondary | ICD-10-CM | POA: Insufficient documentation

## 2023-08-04 DIAGNOSIS — F0280 Dementia in other diseases classified elsewhere without behavioral disturbance: Secondary | ICD-10-CM | POA: Diagnosis not present

## 2023-08-04 DIAGNOSIS — G309 Alzheimer's disease, unspecified: Secondary | ICD-10-CM

## 2023-08-04 DIAGNOSIS — R799 Abnormal finding of blood chemistry, unspecified: Secondary | ICD-10-CM

## 2023-08-04 DIAGNOSIS — Z7189 Other specified counseling: Secondary | ICD-10-CM | POA: Diagnosis not present

## 2023-08-04 DIAGNOSIS — Z515 Encounter for palliative care: Secondary | ICD-10-CM | POA: Diagnosis not present

## 2023-08-04 DIAGNOSIS — R1310 Dysphagia, unspecified: Secondary | ICD-10-CM | POA: Diagnosis not present

## 2023-08-04 LAB — BASIC METABOLIC PANEL WITH GFR
Anion gap: 9 (ref 5–15)
BUN: 12 mg/dL (ref 8–23)
CO2: 21 mmol/L — ABNORMAL LOW (ref 22–32)
Calcium: 8.5 mg/dL — ABNORMAL LOW (ref 8.9–10.3)
Chloride: 109 mmol/L (ref 98–111)
Creatinine, Ser: 0.7 mg/dL (ref 0.44–1.00)
GFR, Estimated: >60 mL/min (ref >60)
Glucose, Bld: 84 mg/dL (ref 70–99)
Potassium: 3.1 mmol/L — ABNORMAL LOW (ref 3.5–5.1)
Sodium: 139 mmol/L (ref 135–145)

## 2023-08-04 LAB — CBC WITH DIFFERENTIAL/PLATELET
Abs Immature Granulocytes: 0.05 10*3/uL (ref 0.00–0.07)
Basophils Absolute: 0.1 10*3/uL (ref 0.0–0.1)
Basophils Relative: 1 %
Eosinophils Absolute: 0.3 10*3/uL (ref 0.0–0.5)
Eosinophils Relative: 3 %
HCT: 29 % — ABNORMAL LOW (ref 36.0–46.0)
Hemoglobin: 9.2 g/dL — ABNORMAL LOW (ref 12.0–15.0)
Immature Granulocytes: 1 %
Lymphocytes Relative: 11 %
Lymphs Abs: 0.9 10*3/uL (ref 0.7–4.0)
MCH: 28.8 pg (ref 26.0–34.0)
MCHC: 31.7 g/dL (ref 30.0–36.0)
MCV: 90.9 fL (ref 80.0–100.0)
Monocytes Absolute: 0.6 10*3/uL (ref 0.1–1.0)
Monocytes Relative: 7 %
Neutro Abs: 6.7 10*3/uL (ref 1.7–7.7)
Neutrophils Relative %: 77 %
Platelets: 233 10*3/uL (ref 150–400)
RBC: 3.19 MIL/uL — ABNORMAL LOW (ref 3.87–5.11)
RDW: 14.1 % (ref 11.5–15.5)
WBC: 8.6 10*3/uL (ref 4.0–10.5)
nRBC: 0 % (ref 0.0–0.2)

## 2023-08-04 LAB — MAGNESIUM: Magnesium: 2.1 mg/dL (ref 1.7–2.4)

## 2023-08-04 MED ORDER — SODIUM CHLORIDE 0.9 % IV SOLN
2.0000 g | INTRAVENOUS | Status: DC
  Administered 2023-08-04: 2 g via INTRAVENOUS
  Filled 2023-08-04: qty 20

## 2023-08-04 MED ORDER — METRONIDAZOLE 500 MG/100ML IV SOLN
500.0000 mg | Freq: Two times a day (BID) | INTRAVENOUS | Status: DC
  Administered 2023-08-05: 500 mg via INTRAVENOUS
  Filled 2023-08-04: qty 100

## 2023-08-04 MED ORDER — POTASSIUM CHLORIDE 10 MEQ/100ML IV SOLN
10.0000 meq | INTRAVENOUS | Status: AC
  Administered 2023-08-04 (×4): 10 meq via INTRAVENOUS
  Filled 2023-08-04 (×4): qty 100

## 2023-08-04 MED ORDER — SODIUM CHLORIDE 0.9 % IV SOLN: 1.0000 g | INTRAVENOUS | Status: DC

## 2023-08-04 NOTE — Progress Notes (Signed)
 Speech Language Pathology Treatment: Dysphagia  Patient Details Name: Kristine Stafford MRN: [id number] DOB: [birth year] Today's Date: 08/04/2023 Time: 8952-8944 SLP Time Calculation (min) (ACUTE ONLY): 8 min  Assessment / Plan / Recommendation Clinical Impression  Pt continues to present with prolonged oral holding, requiring all boluses to be removed via suction. She promptly initiated mastication with ice chips but laryngeal elevation was never observed. No coughing was noted throughout trials of ice chips and thin liquids, although the majority of each bolus was suctioned from her oral cavity. Her respiratory pattern appears irregular, which may lead to increased difficulty coordinating breathing and swallowing. Provided education to pt's spouse re: recommendations to continue providing floor stock liquids for comfort only. SLP will continue following.    HPI HPI: Patient is a 73 year old female admitted with AMS with concern for aspiration due to patient coughing with intake.  Chest x-ray showed progressive left pleural effusion and left basilar airspace disease. Infection is considered. Pt with PMH + for Alzheimer's disease with recent admission 6/16-6/20 with altered mental status due to dehydration and potentially medication side effects. Spouse reports patient now leaning to the left and drooling with variable intake.      SLP Plan  Continue with current plan of care          Recommendations  Diet recommendations: NPO (x floorstock clear liquids for comfort) Liquids provided via: Teaspoon Medication Administration: Via alternative means Supervision: Staff to assist with self feeding;Full supervision/cueing for compensatory strategies;Trained caregiver to feed patient Compensations: Slow rate;Small sips/bites (oral suction to clear bolus) Postural Changes and/or Swallow Maneuvers: Seated upright 90 degrees;Upright 30-60 min after meal                  Oral care QID    Frequent or constant Supervision/Assistance Dysphagia, oral phase (R13.11);Dysphagia, unspecified (R13.10)     Continue with current plan of care     Damien Blumenthal, M.A., CCC-SLP Speech Language Pathology, Acute Rehabilitation Services  Secure Chat preferred [phone]   08/04/2023, 11:11 AM

## 2023-08-04 NOTE — Plan of Care (Signed)
  Problem: Respiratory: Goal: Ability to maintain adequate ventilation will improve Outcome: Progressing   Problem: Activity: Goal: Risk for activity intolerance will decrease Outcome: Not Progressing   Problem: Nutrition: Goal: Adequate nutrition will be maintained Outcome: Not Progressing   Problem: Elimination: Goal: Will not experience complications related to urinary retention Outcome: Progressing

## 2023-08-04 NOTE — Assessment & Plan Note (Signed)
-   1/4 positive with MRSE on 6/16 and on 6/21 again with 2/4 staph epi (1 set only) so still going to presume this is contaminate again - d/c vanc - change abx to cover for aspiration pna at this time

## 2023-08-04 NOTE — Progress Notes (Signed)
   HPI: 73 y.o. female  with past medical history of advanced Alzheimer's dementia, chronic pain syndrome, hypothyroidism, migraines, HLD, and GERD who was admitted on 08/02/2023 with sepsis and acute hypoxic respiratory failure due to suspected aspiration. She was recently hospitalized 6/16 - 6/20 for encephalopathy from polypharmacy (was on Norco, Robaxin , Ativan, melatonin, duloxetine, and pregabalin ).  These meds were held during that admission and not continued at discharge.  Upon returning to her memory care facility she had recurrent/ongoing significant lethargy to the point that she was coughing and having a hard time clearing secretions and unable to eat effectively. Palliative Medicine has been consulted for goals of care discussions. Patient and family are faced with anticipatory care needs and complex medical decision making.   I met today at [facility] bedside with significant other of 44+ years, Signe. Signe is also documented HCPOA. Jeanie sleeps while I speak with Signe. Signe and I reviewed Jeanie's poor prognosis with failure to thrive and poor intake in the setting of dementia. Signe is very realistic about path forward. He recognized poor quality of life and prolonging suffering. He agrees with hospice support and is trying to ensure that she receives the proper care from the hospital. We spent time reviewing hospice support at [facility]. We reviewed likely needs for [facility] to consider and ensure that she is comfortable at end of life. Signe has plans to speak with Aleck from [employer] to discuss care and if they can meet her needs with assist from hospice. Signe would prefer hospice at [facility] if possible - I do feel they can likely meet her needs and keep her comfortable. Signe asks about [facility] and we discussed. He asks about transition from [facility] to [facility] if needed and I explain this is possible but he should connect  with the hospice connected with [facility] - [facility].   [name] plans to have discussions with [facility] and [facility] liaision (I have updated [facility] liaison). We did discuss potential of bacteremia and after discussion he agrees that we should not proceed with long course of antibiotics as this will not alter poor prognosis and outcome. He would like to continue fluids, antibiotics, labs for now while he continues to make decisions and plans for care from here. We discussed transition to full comfort care while focusing solely on treatment of symptoms and he seems open to transition in the near future.   All questions/concerns addressed. Emotional support provided. Updated Dr. Patsy, [facility], hospice liaison.   Exam: Sleeping. Minimally responsive. Drool from mouth. Lying in fetal position. No distress. Breathing regular, unlabored. Abd soft. Warm to touch.   Plan: - DNR/DNI - Continue current measures for now - considering transition to full comfort care - Considering hospice options  55 min  Bernarda Kitty, NP Palliative Medicine Team Pager [phone] (Please see [email or web address] for schedule) Team Phone [phone]

## 2023-08-04 NOTE — Plan of Care (Signed)
  Problem: Respiratory: Goal: Ability to maintain adequate ventilation will improve Outcome: Progressing   Problem: Pain Managment: Goal: General experience of comfort will improve and/or be controlled Outcome: Progressing   Problem: Safety: Goal: Ability to remain free from injury will improve Outcome: Progressing

## 2023-08-04 NOTE — TOC Initial Note (Signed)
 Transition of Care [facility]) - Initial/Assessment Note    Patient Details  Name: Kristine Stafford MRN: [id number] Date of Birth: 07-07-1950  Transition of Care [facility]) CM/SW Contact:    Inocente GORMAN Kindle, LCSW Phone Number: 08/04/2023, 2:22 PM  Clinical Narrative:                 CSW received consult for Hospice at [facility] CSW confirmed plan with patient's significant other, Signe, and he reported agreement for [facility] in the even that patient needs to move to [facility] later. CSW spoke with Aleck, Charity fundraiser at [facility] and she requested a hospital bed, WC, and O2. CSW sent referral to [facility] for set up. Will continue to follow.   Expected Discharge Plan: Memory Care Barriers to Discharge: Continued Medical Work up   Patient Goals and CMS Choice Patient states their goals for this hospitalization and ongoing recovery are:: Return to memory care with Hospice CMS Medicare.gov Compare Post Acute Care list provided to:: Patient Represenative (must comment) Choice offered to / list presented to : [facility] POA / [facility] [place] ownership interest in [facility].provided to:: [facility] POA / Guardian    Expected Discharge Plan and Services In-house Referral: Clinical Social Work   Post Acute Care Choice: Hospice Living arrangements for the past 2 months: Assisted Living Facility                                      Prior Living Arrangements/Services Living arrangements for the past 2 months: Assisted Living Facility Lives with:: Facility Resident Patient language and need for interpreter reviewed:: Yes Do you feel safe going back to the place where you live?: Yes      Need for Family Participation in Patient Care: Yes (Comment) Care giver support system in place?: Yes (comment)   Criminal Activity/Legal Involvement Pertinent to Current Situation/Hospitalization: No - Comment as needed  Activities of Daily Living   ADL Screening (condition at time of  admission) Independently performs ADLs?: No Is the patient deaf or have difficulty hearing?: No Does the patient have difficulty seeing, even when wearing glasses/contacts?: Yes Does the patient have difficulty concentrating, remembering, or making decisions?: Yes  Permission Sought/Granted Permission sought to share information with : Facility Medical sales representative, Family Supports Permission granted to share information with : No  Share Information with NAME: Signe  Permission granted to share info w AGENCY: [facility]  Permission granted to share info w Relationship: Significant other, POA  Permission granted to share info w Contact Information: [phone]  Emotional Assessment Appearance:: Appears stated age Attitude/Demeanor/Rapport: Unable to Assess Affect (typically observed): Unable to Assess Orientation: :  (Disoriented x4) Alcohol / Substance Use: Not Applicable Psych Involvement: No (comment)  Admission diagnosis:  Aspiration pneumonia (HCC) [J69.0] Sepsis (HCC) [A41.9] Sepsis, due to unspecified organism, unspecified whether acute organ dysfunction present [facility]) [A41.9] Patient Active Problem List   Diagnosis Date Noted   Sepsis (HCC) 08/03/2023   Goals of care, counseling/discussion 08/03/2023   Dysphagia 08/03/2023   Aspiration pneumonia (HCC) 08/03/2023   Acute respiratory failure with hypoxia (HCC) 08/02/2023   Major neurocognitive disorder due to Alzheimer's disease 01/15/2021   Pure hypercholesterolemia    Melanocytic nevi of trunk 01/11/2021   GERD (gastroesophageal reflux disease) 01/11/2021   Heart murmur 01/11/2021   Osteopenia 01/11/2021   Osteoporosis 01/11/2021   PVC (premature ventricular contraction) 01/11/2021   Hypothyroidism  01/11/2021   Chronic pain syndrome    Left hip pain    Tick bite 09/08/2015   Genital HSV 04/08/2012   PCP:  Sun, Vyvyan, MD Pharmacy:   [facility] [place], [place] - [address] [facility] [place]. [address] [place] [address] Phone: [phone] Fax: [phone]     Social Drivers of Health (SDOH) Social History: SDOH Screenings   Food Insecurity: Patient Unable To Answer (08/04/2023)  Housing: Patient Unable To Answer (08/04/2023)  Transportation Needs: Patient Unable To Answer (08/04/2023)  Utilities: Patient Unable To Answer (08/04/2023)  Social Connections: Patient Unable To Answer (08/04/2023)  Tobacco Use: Low Risk  (08/02/2023)   SDOH Interventions:     Readmission Risk Interventions     No data to display

## 2023-08-04 NOTE — Progress Notes (Signed)
 Progress Note    Kristine Stafford   FMW:[id number]  DOB: Nov 24, 1950  DOA: 08/02/2023     1 PCP: Sun, Vyvyan, MD  Initial CC: SOB  Hospital Course: Kristine Stafford is a 73 y.o. female with medical history significant for advanced Alzheimer's dementia, chronic pain syndrome, hypothyroidism, migraines, HLD, GERD who is admitted with sepsis and acute hypoxic respiratory failure due to suspected aspiration.  She was also hospitalized recently from 6/16 - 6/20 for encephalopathy from polypharmacy (was on norco, robaxin , ativan, melatonin, duloxetine, and pregablin). These meds were held during that admission and not continued at discharge.   Upon returning to her memory care facility she had recurrent/ongoing significant lethargy to the point that she was coughing and having a hard time clearing her secretions and unable to eat effectively.  Her significant other also notes that her decline has been gradual but progressive. Oral intake was still adequate up until the last few weeks and she has been less interactive and remains in her room for longer times and does not engage much anymore.   CXR was obtained on admission and the day after. It showed concern for left effusion but couldn't rule out infection. Given the concern for aspiration based on history she was started on antibiotics and admitted for further monitoring and GOC discussions.   Interval History:  Significant other present bedside this morning.  No events overnight.  We discussed continuing on antibiotics for now to cover for aspiration.  Blood cultures were still being processed this morning during rounds but looks to be contamination. Disposition still being figured out.  Looks to be either sending her back to [facility] with hospice in place versus pursuit of residential hospice.  Assessment and Plan: * Sepsis (HCC)-resolved as of 08/04/2023 - fever, tachycardia, tachypnea, leukocytosis; suspected pulmonary source from  aspiration - continue abx and monitor response - DNR confirmed; no CPR or intubation, but SO okay with IVF, abx. If she were to need pressors, I believe this would be a prognostic sign that she's not improving/thriving and we'd need to consider transition to comfort at that time - palliative care consulted to help continue GOC; her SO seems reasonable and understands she is not thriving well in general especially with readmission - s/p IVF; okay to complete albumin early; BP has improved - PCT negative - changing abx to rocephin/flagyl to cover for presumed aspiration   Dysphagia - needs SLP eval and her SO will then decide on next steps; I recommended against PEG tube given the data in advanced dementia patient and he seems to agree - NPO but we're okay with some pleasure feeds; see order  Goals of care, counseling/discussion - see Alz as well - in general, she looks to be FTT at this time with now close readmission and inability to eat/dysphagia due to weakness and decline; meds previously adjusted and upon returning to memory care, still not thriving well - s/p SLP eval; essentially pleasure feeds at this time - we're heading towards either hospice at [facility] vs residential hospice [facility] place etc) - palliative care also following  Major neurocognitive disorder due to Alzheimer's disease - overall looks like FTT and she's been progressively declining - just admitted for polypharmacy and oversedation but even after meds adjusted still very disengaged and now with dysphagia, this may start to play more of a role in prognosis - needs SLP eval and her SO will then decide on next steps; I recommended against PEG tube given  the data in advanced dementia patient and he seems to agree - She has been started on essentially pleasure feeding with floor stock clear liquids and ice cream -Significant other planning on hospice at discharge either back at her facility or going to potentially  residential hospice  Acute respiratory failure with hypoxia (HCC) - in setting of suspected aspiration; does have small left effusion as well - continue O2 for now; I think we're approaching a hospice route and continuing O2 is reasonable as she may not be able to be weaned off, but can attempt  Contamination of blood culture-resolved as of 08/04/2023 - 1/4 positive with MRSE on 6/16 and on 6/21 again with 2/4 staph epi (1 set only) so still going to presume this is contaminate again - d/c vanc - change abx to cover for aspiration pna at this time  Hypothyroidism - not on synthroid anymore? - TSH normal, 2.406  Pure hypercholesterolemia - statin discontinued last admission for rhabdo, but also no further mortality benefit at this time so would not resume regardless  Chronic pain syndrome -tylenol  scheduled and PRN only - taken off multiple pain/sedating meds last admission    Old records reviewed in assessment of this patient  Antimicrobials: Aztreonam  08/02/2023 x 1 Cefepime 08/03/2023 >> 6/23 Vancomycin  08/02/2023 >> 6/23 Rocephin 6/23 >> current  Flagyl 08/03/2023 >> current  DVT prophylaxis:  enoxaparin  (LOVENOX ) injection 40 mg Start: 08/03/23 1000   Code Status:   Code Status: Do not attempt resuscitation (DNR) - Comfort care  Mobility Assessment (Last 72 Hours)     Mobility Assessment     Row Name 08/04/23 0858 08/04/23 0257 08/04/23 0130       Does patient have an order for bedrest or is patient medically unstable No - Continue assessment -- No - Continue assessment     What is the highest level of mobility based on the progressive mobility assessment? Level 1 (Bedfast) - Unable to balance while sitting on edge of bed -- Level 1 (Bedfast) - Unable to balance while sitting on edge of bed     Is the above level different from baseline mobility prior to current illness? Yes - Recommend PT order No - Consider discontinuing PT/OT --        Barriers to discharge:  None Disposition Plan: Possibly residential hospice vs [facility] with hospice in place  Status is: Inpatient  Objective: Blood pressure 136/64, pulse (!) 102, temperature 99.5 F (37.5 C), temperature source Oral, resp. rate 20, height 5' 7 (1.702 m), weight 67.2 kg, SpO2 99%.  Examination:  Physical Exam Constitutional:      Comments: Chronically ill-appearing frail elderly woman nonverbal laying in bed in no distress; minimally interactive but does nod head appropriately  HENT:     Head: Normocephalic and atraumatic.     Mouth/Throat:     Mouth: Mucous membranes are dry.   Eyes:     Extraocular Movements: Extraocular movements intact.    Cardiovascular:     Rate and Rhythm: Normal rate and regular rhythm.  Pulmonary:     Comments: Coarse breath sounds bilaterally Abdominal:     General: There is no distension.     Palpations: Abdomen is soft.     Tenderness: There is no abdominal tenderness.   Musculoskeletal:        General: No swelling. Normal range of motion.     Cervical back: Normal range of motion and neck supple.   Skin:    General: Skin is  warm and dry.   Neurological:     Comments: Nonverbal.  Baseline dementia appreciated.  Follows some commands with upper extremities     Consultants:  Palliative care  Procedures:    Data Reviewed: Results for orders placed or performed during the hospital encounter of 08/02/23 (from the past 24 hours)  Basic metabolic panel with GFR     Status: Abnormal   Collection Time: 08/04/23  6:44 AM  Result Value Ref Range   Sodium 139 135 - 145 mmol/L   Potassium 3.1 (L) 3.5 - 5.1 mmol/L   Chloride 109 98 - 111 mmol/L   CO2 21 (L) 22 - 32 mmol/L   Glucose, Bld 84 70 - 99 mg/dL   BUN 12 8 - 23 mg/dL   Creatinine, Ser 9.29 0.44 - 1.00 mg/dL   Calcium 8.5 (L) 8.9 - 10.3 mg/dL   GFR, Estimated >39 >39 mL/min   Anion gap 9 5 - 15  Magnesium     Status: None   Collection Time: 08/04/23  6:44 AM  Result Value Ref  Range   Magnesium 2.1 1.7 - 2.4 mg/dL  CBC with Differential/Platelet     Status: Abnormal   Collection Time: 08/04/23  8:45 AM  Result Value Ref Range   WBC 8.6 4.0 - 10.5 K/uL   RBC 3.19 (L) 3.87 - 5.11 MIL/uL   Hemoglobin 9.2 (L) 12.0 - 15.0 g/dL   HCT 70.9 (L) 63.9 - 53.9 %   MCV 90.9 80.0 - 100.0 fL   MCH 28.8 26.0 - 34.0 pg   MCHC 31.7 30.0 - 36.0 g/dL   RDW 85.8 88.4 - 84.4 %   Platelets 233 150 - 400 K/uL   nRBC 0.0 0.0 - 0.2 %   Neutrophils Relative % 77 %   Neutro Abs 6.7 1.7 - 7.7 K/uL   Lymphocytes Relative 11 %   Lymphs Abs 0.9 0.7 - 4.0 K/uL   Monocytes Relative 7 %   Monocytes Absolute 0.6 0.1 - 1.0 K/uL   Eosinophils Relative 3 %   Eosinophils Absolute 0.3 0.0 - 0.5 K/uL   Basophils Relative 1 %   Basophils Absolute 0.1 0.0 - 0.1 K/uL   Immature Granulocytes 1 %   Abs Immature Granulocytes 0.05 0.00 - 0.07 K/uL    I have reviewed pertinent nursing notes, vitals, labs, and images as necessary. I have ordered labwork to follow up on as indicated.  I have reviewed the last notes from staff over past 24 hours. I have discussed patient's care plan and test results with nursing staff, CM/SW, and other staff as appropriate.  Time spent: Greater than 50% of the 55 minute visit was spent in counseling/coordination of care for the patient as laid out in the A&P.   LOS: 1 day   Alm Apo, MD [facility] 08/04/2023, 4:25 PM

## 2023-08-05 DIAGNOSIS — G309 Alzheimer's disease, unspecified: Secondary | ICD-10-CM | POA: Diagnosis not present

## 2023-08-05 DIAGNOSIS — R1310 Dysphagia, unspecified: Secondary | ICD-10-CM | POA: Diagnosis not present

## 2023-08-05 DIAGNOSIS — A419 Sepsis, unspecified organism: Secondary | ICD-10-CM | POA: Diagnosis not present

## 2023-08-05 DIAGNOSIS — Z7189 Other specified counseling: Secondary | ICD-10-CM | POA: Diagnosis not present

## 2023-08-05 LAB — BASIC METABOLIC PANEL WITH GFR
Anion gap: 17 — ABNORMAL HIGH (ref 5–15)
BUN: 13 mg/dL (ref 8–23)
CO2: 17 mmol/L — ABNORMAL LOW (ref 22–32)
Calcium: 9.1 mg/dL (ref 8.9–10.3)
Chloride: 108 mmol/L (ref 98–111)
Creatinine, Ser: 0.74 mg/dL (ref 0.44–1.00)
GFR, Estimated: >60 mL/min (ref >60)
Glucose, Bld: 74 mg/dL (ref 70–99)
Potassium: 3.1 mmol/L — ABNORMAL LOW (ref 3.5–5.1)
Sodium: 142 mmol/L (ref 135–145)

## 2023-08-05 LAB — CBC WITH DIFFERENTIAL/PLATELET
Abs Immature Granulocytes: 0.05 10*3/uL (ref 0.00–0.07)
Basophils Absolute: 0 10*3/uL (ref 0.0–0.1)
Basophils Relative: 1 %
Eosinophils Absolute: 0.3 10*3/uL (ref 0.0–0.5)
Eosinophils Relative: 4 %
HCT: 26.3 % — ABNORMAL LOW (ref 36.0–46.0)
Hemoglobin: 8.5 g/dL — ABNORMAL LOW (ref 12.0–15.0)
Immature Granulocytes: 1 %
Lymphocytes Relative: 12 %
Lymphs Abs: 0.9 10*3/uL (ref 0.7–4.0)
MCH: 28.4 pg (ref 26.0–34.0)
MCHC: 32.3 g/dL (ref 30.0–36.0)
MCV: 88 fL (ref 80.0–100.0)
Monocytes Absolute: 0.5 10*3/uL (ref 0.1–1.0)
Monocytes Relative: 7 %
Neutro Abs: 5.6 10*3/uL (ref 1.7–7.7)
Neutrophils Relative %: 75 %
Platelets: 236 10*3/uL (ref 150–400)
RBC: 2.99 MIL/uL — ABNORMAL LOW (ref 3.87–5.11)
RDW: 14.1 % (ref 11.5–15.5)
WBC: 7.5 10*3/uL (ref 4.0–10.5)
nRBC: 0 % (ref 0.0–0.2)

## 2023-08-05 LAB — MAGNESIUM: Magnesium: 2.2 mg/dL (ref 1.7–2.4)

## 2023-08-05 MED ORDER — LORAZEPAM 2 MG/ML PO CONC: 0.5000 mg | Freq: Four times a day (QID) | ORAL | Status: DC | PRN

## 2023-08-05 MED ORDER — MORPHINE SULFATE (CONCENTRATE) 10 MG /0.5 ML PO SOLN
5.0000 mg | ORAL | Status: DC | PRN
  Administered 2023-08-05: 5 mg via SUBLINGUAL
  Filled 2023-08-05: qty 0.5

## 2023-08-05 MED ORDER — CHLORHEXIDINE GLUCONATE CLOTH 2 % EX PADS: 6.0000 | MEDICATED_PAD | Freq: Every day | CUTANEOUS | Status: DC

## 2023-08-05 MED ORDER — GLYCOPYRROLATE 1 MG PO TABS: 1.0000 mg | ORAL_TABLET | Freq: Four times a day (QID) | ORAL | Status: DC | PRN

## 2023-08-05 NOTE — Progress Notes (Signed)
 cardiac monitoring team called and stated that patient just experienced a lot of pauses. and that they saved it to epic for MD to look at. Girguis,MD made aware

## 2023-08-05 NOTE — Progress Notes (Signed)
 Palliative:  HPI: 73 y.o. female  with past medical history of advanced Alzheimer's dementia, chronic pain syndrome, hypothyroidism, migraines, HLD, and GERD who was admitted on 08/02/2023 with sepsis and acute hypoxic respiratory failure due to suspected aspiration. She was recently hospitalized 6/16 - 6/20 for encephalopathy from polypharmacy (was on Norco, Robaxin , Ativan , melatonin, duloxetine, and pregabalin ).  These meds were held during that admission and not continued at discharge.  Upon returning to her memory care facility she had recurrent/ongoing significant lethargy to the point that she was coughing and having a hard time clearing secretions and unable to eat effectively. Palliative Medicine has been consulted for goals of care discussions. Patient and family are faced with anticipatory care needs and complex medical decision making.    I met again today with Signe at [facility] bedside. Jeanie appears to be resting comfortably. Signe confirms plans for transition back to [facility] with hospice support. Signe understands that Jeanie is at end of life and confirms full comfort care. He is happy that she can return to [facility] and is anxious to get her back there. He has no further needs.   Coordinated with Dr. Patsy, [facility], and hospice liaison to ensure everything is in place for discharge today.   All questions/concerns addressed. Emotional support provided.   Exam: Sleeping. Minimally responsive. No distress. Breathing regular, unlabored. Abd soft. Warm to touch.    Plan: - DNR/DNI - Full comfort care - Return to [facility] with hospice for end of life care  25 min  Bernarda Kitty, NP Palliative Medicine Team Pager [phone] (Please see [email or web address] for schedule) Team Phone [phone]

## 2023-08-05 NOTE — Progress Notes (Signed)
 report [phone] given to [facility]. Spoke with nurse caroline who verbalized understanding of report and had no additional questions. Pt currently in room waiting for [facility] to pick her up.

## 2023-08-05 NOTE — Progress Notes (Signed)
 Pt husband declined foley being placed

## 2023-08-05 NOTE — Progress Notes (Signed)
 Patient transferred by [facility] at 2230,vital signs obtained,IV removed,and discharge packet given to [facility].

## 2023-08-05 NOTE — Progress Notes (Signed)
 Tele box [id number] taken off of pt and placed in tele drawer at nurses staion.

## 2023-08-05 NOTE — Discharge Summary (Signed)
 Physician Discharge Summary   Kristine Stafford FMW:[id number] DOB: 19-Apr-1950 DOA: 08/02/2023  PCP: Sun, Vyvyan, MD  Admit date: 08/02/2023 Discharge date: 08/05/2023   [facility] Disposition:  [facility] Discharging physician: Alm Apo, MD Barriers to discharge: none  Recommendations at discharge: Continue comfort care  Discharge Condition: stable CODE STATUS: DNR Diet recommendation:  Diet Orders (From admission, onward)     Start     Ordered   08/04/23 1104  Diet NPO time specified Except for: Ice Chips  Diet effective now       Comments: Floor stock clear liquids or ice cream are okay with supervision from spouse or staff No oral meds, given via alternative means  Question:  Except for  Answer:  Ice Chips   08/04/23 1104            Hospital Course: Kristine Stafford is a 73 y.o. female with medical history significant for advanced Alzheimer's dementia, chronic pain syndrome, hypothyroidism, migraines, HLD, GERD who is admitted with sepsis and acute hypoxic respiratory failure due to suspected aspiration.  She was also hospitalized recently from 6/16 - 6/20 for encephalopathy from polypharmacy (was on norco, robaxin , ativan, melatonin, duloxetine, and pregablin). These meds were held during that admission and not continued at discharge.   Upon returning to her memory care facility she had recurrent/ongoing significant lethargy to the point that she was coughing and having a hard time clearing her secretions and unable to eat effectively.  Her significant other also notes that her decline has been gradual but progressive. Oral intake was still adequate up until the last few weeks and she has been less interactive and remains in her room for longer times and does not engage much anymore.   CXR was obtained on admission and the day after. It showed concern for left effusion but couldn't rule out infection. Given the concern for aspiration based  on history she was started on antibiotics and admitted for further monitoring and GOC discussions.   With ongoing discussions, decision was made for de-escalation of care and transitioning to hospice and comfort care.  DME was arranged and family request was for patient to return back to [facility] with hospice in place.  If she were to further decline or warrant needing to go to [facility], this was also considered an option.  Assessment and Plan: * Sepsis (HCC)-resolved as of 08/04/2023 - fever, tachycardia, tachypnea, leukocytosis; suspected pulmonary source from aspiration - continue abx and monitor response - DNR confirmed; no CPR or intubation, but SO okay with IVF, abx. If she were to need pressors, I believe this would be a prognostic sign that she's not improving/thriving and we'd need to consider transition to comfort at that time - palliative care consulted to help continue GOC; her SO seems reasonable and understands she is not thriving well in general especially with readmission - s/p IVF; okay to complete albumin early; BP has improved - PCT negative - changing abx to rocephin/flagyl to cover for presumed aspiration; d/c abx after GOC discussions on 6/24 - After further GOC discussions, decision was made for returning back to [facility] with hospice in place  Dysphagia - needs SLP eval and her SO will then decide on next steps; I recommended against PEG tube given the data in advanced dementia patient and he seems to agree - NPO but we're okay with some pleasure feeds  Goals of care, counseling/discussion - see Alz as well - in general, she  looks to be FTT at this time with now close readmission and inability to eat/dysphagia due to weakness and decline; meds previously adjusted and upon returning to memory care, still not thriving well - s/p SLP eval; essentially pleasure feeds at this time - we're heading towards either hospice at [facility] vs residential hospice  [facility] place etc) - palliative care also following  Major neurocognitive disorder due to Alzheimer's disease - overall looks like FTT and she's been progressively declining - just admitted for polypharmacy and oversedation but even after meds adjusted still very disengaged and now with dysphagia, this may start to play more of a role in prognosis - needs SLP eval and her SO will then decide on next steps; I recommended against PEG tube given the data in advanced dementia patient and he seems to agree - She has been started on essentially pleasure feeding with floor stock clear liquids and ice cream - Kristine Stafford has elected for returning back to memory care with hospice in place  Acute respiratory failure with hypoxia (HCC) - in setting of suspected aspiration; does have small left effusion as well - continue O2 for now; I think we're approaching a hospice route and continuing O2 is reasonable as she may not be able to be weaned off, but can attempt  Contamination of blood culture-resolved as of 08/04/2023 - 1/4 positive with MRSE on 6/16 and on 6/21 again with 2/4 staph epi (1 set only) so still going to presume this is contaminate again - d/c vanc - change abx to cover for aspiration pna at this time  Hypothyroidism - not on synthroid anymore? - TSH normal, 2.406  Pure hypercholesterolemia - statin discontinued last admission for rhabdo, but also no further mortality benefit at this time so would not resume regardless  Chronic pain syndrome - taken off multiple pain/sedating meds last admission  - Currently is comfortable.  In setting of comfort care, would use sublingual medications if able versus may need subcutaneous or temporary IV     Principal Diagnosis: Sepsis [facility])  Discharge Diagnoses: Active Hospital Problems   Diagnosis Date Noted   Goals of care, counseling/discussion 08/03/2023    Priority: 2.   Dysphagia 08/03/2023    Priority: 2.   Major neurocognitive disorder due  to Alzheimer's disease 01/15/2021    Priority: 2.   Acute respiratory failure with hypoxia (HCC) 08/02/2023    Priority: 3.   Hypothyroidism 01/11/2021    Priority: 4.   Pressure injury of skin 08/04/2023   Aspiration pneumonia (HCC) 08/03/2023   Pure hypercholesterolemia    GERD (gastroesophageal reflux disease) 01/11/2021   Chronic pain syndrome     Resolved Hospital Problems   Diagnosis Date Noted Date Resolved   Sepsis [facility]) 08/03/2023 08/04/2023    Priority: 1.   Contamination of blood culture 08/04/2023 08/04/2023    Priority: 3.     Discharge Instructions     No wound care   Complete by: As directed       Allergies as of 08/05/2023       Reactions   Egg-derived Products Other (See Comments)   Husband unaware of intolerance Not listed on the [facility]   Other    Oats,poppyseeds,yeast,baker and brewers-Husband unaware of intolerance Not listed on the [facility]   Penicillins Other (See Comments)   Unknown reaction   Tetracyclines & Related Other (See Comments)   Unknown reaction        Medication List     STOP taking these medications  acetaminophen  325 MG tablet Commonly known as: TYLENOL    busPIRone 7.5 MG tablet Commonly known as: BUSPAR   calcium carbonate 500 MG chewable tablet Commonly known as: TUMS - dosed in mg elemental calcium   cyanocobalamin  1000 MCG tablet Commonly known as: VITAMIN B12   donepezil  10 MG tablet Commonly known as: ARICEPT    loperamide 2 MG tablet Commonly known as: IMODIUM A-D   memantine  10 MG tablet Commonly known as: NAMENDA    pantoprazole  40 MG tablet Commonly known as: PROTONIX    Vitamin D3 125 MCG (5000 UT) Tabs       TAKE these medications    divalproex  125 MG capsule Commonly known as: DEPAKOTE  SPRINKLE Take 125 mg by mouth 2 (two) times daily.        Allergies  Allergen Reactions   Egg-Derived Products Other (See Comments)    Husband unaware of intolerance Not listed on the [facility]   Other      Oats,poppyseeds,yeast,baker and brewers-Husband unaware of intolerance Not listed on the [facility]   Penicillins Other (See Comments)    Unknown reaction   Tetracyclines & Related Other (See Comments)    Unknown reaction    Consultations: Palliative care  Procedures:   Discharge Exam: BP 104/63 (BP Location: Left Wrist)   Pulse 93   Temp 99.1 F (37.3 C) (Oral)   Resp 20   Ht 5' 7 (1.702 m)   Wt 67.2 kg   SpO2 93%   BMI 23.20 kg/m  Physical Exam Constitutional:      Comments: Chronically ill-appearing frail elderly woman nonverbal laying in bed in no distress; minimally interactive but does nod head appropriately  HENT:     Head: Normocephalic and atraumatic.     Mouth/Throat:     Mouth: Mucous membranes are dry.   Eyes:     Extraocular Movements: Extraocular movements intact.    Cardiovascular:     Rate and Rhythm: Normal rate and regular rhythm.  Pulmonary:     Comments: Coarse breath sounds bilaterally Abdominal:     General: There is no distension.     Palpations: Abdomen is soft.     Tenderness: There is no abdominal tenderness.   Musculoskeletal:        General: No swelling. Normal range of motion.     Cervical back: Normal range of motion and neck supple.   Skin:    General: Skin is warm and dry.   Neurological:     Comments: Nonverbal.  Baseline dementia appreciated.  Follows some commands with upper extremities     The results of significant diagnostics from this hospitalization (including imaging, microbiology, ancillary and laboratory) are listed below for reference.   Microbiology: Recent Results (from the past 240 hours)  Blood Culture (routine x 2)     Status: Abnormal   Collection Time: 07/28/23  5:49 PM   Specimen: BLOOD  Result Value Ref Range Status   Specimen Description BLOOD SITE NOT SPECIFIED  Final   Special Requests   Final    BOTTLES DRAWN AEROBIC AND ANAEROBIC Blood Culture adequate volume   Culture  Setup Time   Final    GRAM  POSITIVE COCCI IN PAIRS AEROBIC BOTTLE ONLY CRITICAL RESULT CALLED TO, READ BACK BY AND VERIFIED WITH: PHARMD CAREN AMEND ON 07/29/23 @ 1921 BY DRT    Culture (A)  Final    STAPHYLOCOCCUS EPIDERMIDIS THE SIGNIFICANCE OF ISOLATING THIS ORGANISM FROM A SINGLE SET OF BLOOD CULTURES WHEN MULTIPLE SETS ARE DRAWN IS  UNCERTAIN. PLEASE NOTIFY THE MICROBIOLOGY DEPARTMENT WITHIN ONE WEEK IF SPECIATION AND SENSITIVITIES ARE REQUIRED. Performed at [facility], [address] [place], [place] [address]    Report Status 07/30/2023 FINAL  Final  Blood Culture (routine x 2)     Status: None   Collection Time: 07/28/23  5:49 PM   Specimen: BLOOD  Result Value Ref Range Status   Specimen Description BLOOD SITE NOT SPECIFIED  Final   Special Requests   Final    BOTTLES DRAWN AEROBIC AND ANAEROBIC Blood Culture adequate volume   Culture   Final    NO GROWTH 5 DAYS Performed at [facility], [address] [place], [place] [address]    Report Status 08/02/2023 FINAL  Final  Blood Culture ID Panel (Reflexed)     Status: Abnormal   Collection Time: 07/28/23  5:49 PM  Result Value Ref Range Status   Enterococcus faecalis NOT DETECTED NOT DETECTED Final   Enterococcus Faecium NOT DETECTED NOT DETECTED Final   Listeria monocytogenes NOT DETECTED NOT DETECTED Final   Staphylococcus species DETECTED (A) NOT DETECTED Final    Comment: CRITICAL RESULT CALLED TO, READ BACK BY AND VERIFIED WITH: PHARMD CAREN AMEND ON 07/29/23 @ 1921 BY DRT    Staphylococcus aureus (BCID) NOT DETECTED NOT DETECTED Final   Staphylococcus epidermidis DETECTED (A) NOT DETECTED Final    Comment: Methicillin (oxacillin) resistant coagulase negative staphylococcus. Possible blood culture contaminant (unless isolated from more than one blood culture draw or clinical case suggests pathogenicity). No antibiotic treatment is indicated for blood  culture contaminants. CRITICAL RESULT CALLED TO, READ BACK BY AND VERIFIED  WITH: PHARMD CAREN AMEND ON 07/29/23 @ 1921 BY DRT    Staphylococcus lugdunensis NOT DETECTED NOT DETECTED Final   Streptococcus species NOT DETECTED NOT DETECTED Final   Streptococcus agalactiae NOT DETECTED NOT DETECTED Final   Streptococcus pneumoniae NOT DETECTED NOT DETECTED Final   Streptococcus pyogenes NOT DETECTED NOT DETECTED Final   A.calcoaceticus-baumannii NOT DETECTED NOT DETECTED Final   Bacteroides fragilis NOT DETECTED NOT DETECTED Final   Enterobacterales NOT DETECTED NOT DETECTED Final   Enterobacter cloacae complex NOT DETECTED NOT DETECTED Final   Escherichia coli NOT DETECTED NOT DETECTED Final   Klebsiella aerogenes NOT DETECTED NOT DETECTED Final   Klebsiella oxytoca NOT DETECTED NOT DETECTED Final   Klebsiella pneumoniae NOT DETECTED NOT DETECTED Final   Proteus species NOT DETECTED NOT DETECTED Final   Salmonella species NOT DETECTED NOT DETECTED Final   Serratia marcescens NOT DETECTED NOT DETECTED Final   Haemophilus influenzae NOT DETECTED NOT DETECTED Final   Neisseria meningitidis NOT DETECTED NOT DETECTED Final   Pseudomonas aeruginosa NOT DETECTED NOT DETECTED Final   Stenotrophomonas maltophilia NOT DETECTED NOT DETECTED Final   Candida albicans NOT DETECTED NOT DETECTED Final   Candida auris NOT DETECTED NOT DETECTED Final   Candida glabrata NOT DETECTED NOT DETECTED Final   Candida krusei NOT DETECTED NOT DETECTED Final   Candida parapsilosis NOT DETECTED NOT DETECTED Final   Candida tropicalis NOT DETECTED NOT DETECTED Final   Cryptococcus neoformans/gattii NOT DETECTED NOT DETECTED Final   Methicillin resistance mecA/C DETECTED (A) NOT DETECTED Final    Comment: CRITICAL RESULT CALLED TO, READ BACK BY AND VERIFIED WITH: PHARMD CAREN AMEND ON 07/29/23 @ 1921 BY DRT Performed at [facility], [address], [place], [place] [address]   Culture, blood (Routine x 2)     Status: None (Preliminary result)  Collection Time: 08/02/23  9:14 PM    Specimen: BLOOD  Result Value Ref Range Status   Specimen Description BLOOD RIGHT ANTECUBITAL  Final   Special Requests   Final    BOTTLES DRAWN AEROBIC AND ANAEROBIC Blood Culture results may not be optimal due to an inadequate volume of blood received in culture bottles   Culture   Final    NO GROWTH 3 DAYS Performed at [facility], [address] [place], [place] [address]    Report Status PENDING  Incomplete  Culture, blood (Routine x 2)     Status: Abnormal (Preliminary result)   Collection Time: 08/02/23  9:42 PM   Specimen: BLOOD RIGHT HAND  Result Value Ref Range Status   Specimen Description BLOOD RIGHT HAND  Final   Special Requests   Final    BOTTLES DRAWN AEROBIC AND ANAEROBIC Blood Culture results may not be optimal due to an inadequate volume of blood received in culture bottles   Culture  Setup Time   Final    GRAM POSITIVE COCCI IN CLUSTERS IN BOTH AEROBIC AND ANAEROBIC BOTTLES CRITICAL RESULT CALLED TO, READ BACK BY AND VERIFIED WITH: PHARMD ROCKY SLADE [id number] AT 1927 BY EC CRITICAL VALUE NOTED.  VALUE IS CONSISTENT WITH PREVIOUSLY REPORTED AND CALLED VALUE.    Culture (A)  Final    STAPHYLOCOCCUS EPIDERMIDIS SUSCEPTIBILITIES TO FOLLOW STAPHYLOCOCCUS HOMINIS THE SIGNIFICANCE OF ISOLATING THIS ORGANISM FROM A SINGLE SET OF BLOOD CULTURES WHEN MULTIPLE SETS ARE DRAWN IS UNCERTAIN. PLEASE NOTIFY THE MICROBIOLOGY DEPARTMENT WITHIN ONE WEEK IF SPECIATION AND SENSITIVITIES ARE REQUIRED. Performed at [facility], [address], [place], [place] [address]    Report Status PENDING  Incomplete     Labs: BNP (last 3 results) Recent Labs    08/02/23 2109  BNP 121.3*   Basic Metabolic Panel: Recent Labs  Lab 07/30/23 0619 07/31/23 0629 08/01/23 0500 08/02/23 2109 08/04/23 0644 08/05/23 0622  NA 140 139 136 139 139 142  K 3.6 4.4 3.7 3.6 3.1* 3.1*  CL 108 103 103 103 109 108  CO2 22 21* 22 26 21* 17*  GLUCOSE 99 104* 128* 119* 84 74  BUN  8 13 15 14 12 13   CREATININE 0.61 0.63 0.59 0.77 0.70 0.74  CALCIUM 8.5* 9.0 8.6* 9.2 8.5* 9.1  MG 2.0 2.0  --   --  2.1 2.2   Liver Function Tests: Recent Labs  Lab 07/30/23 0619 07/31/23 0629 08/02/23 2109  AST 36 35 21  ALT 20 19 19   ALKPHOS 37* 46 47  BILITOT 0.7 1.1 0.4  PROT 5.4* 6.3* 6.4*  ALBUMIN 2.6* 2.9* 2.9*   No results for input(s): LIPASE, AMYLASE in the last 168 hours. Recent Labs  Lab 08/01/23 0500  AMMONIA 17   CBC: Recent Labs  Lab 07/30/23 0619 07/31/23 0629 08/02/23 2109 08/04/23 0845 08/05/23 0622  WBC 6.3 7.5 11.0* 8.6 7.5  NEUTROABS  --   --  9.0* 6.7 5.6  HGB 11.4* 13.2 12.8 9.2* 8.5*  HCT 36.6 40.2 40.1 29.0* 26.3*  MCV 91.7 88.7 89.7 90.9 88.0  PLT 203 284 308 233 236   Cardiac Enzymes: Recent Labs  Lab 07/30/23 0619 08/01/23 0500 08/02/23 2109  CKTOTAL 1,031* 587* 194   BNP: Invalid input(s): POCBNP CBG: No results for input(s): GLUCAP in the last 168 hours. D-Dimer No results for input(s): DDIMER in the last 72 hours. Hgb A1c No results for input(s): HGBA1C in the last 72  hours. Lipid Profile No results for input(s): CHOL, HDL, LDLCALC, TRIG, CHOLHDL, LDLDIRECT in the last 72 hours. Thyroid function studies Recent Labs    08/02/23 2114  TSH 2.406   Anemia work up No results for input(s): VITAMINB12, FOLATE, FERRITIN, TIBC, IRON, RETICCTPCT in the last 72 hours. Urinalysis    Component Value Date/Time   COLORURINE YELLOW 08/02/2023 2310   APPEARANCEUR CLEAR 08/02/2023 2310   LABSPEC 1.024 08/02/2023 2310   PHURINE 5.0 08/02/2023 2310   GLUCOSEU NEGATIVE 08/02/2023 2310   HGBUR NEGATIVE 08/02/2023 2310   BILIRUBINUR NEGATIVE 08/02/2023 2310   KETONESUR 5 (A) 08/02/2023 2310   PROTEINUR NEGATIVE 08/02/2023 2310   NITRITE NEGATIVE 08/02/2023 2310   LEUKOCYTESUR NEGATIVE 08/02/2023 2310   Sepsis Labs Recent Labs  Lab 07/31/23 0629 08/02/23 2109 08/04/23 0845 08/05/23 0622   WBC 7.5 11.0* 8.6 7.5   Microbiology Recent Results (from the past 240 hours)  Blood Culture (routine x 2)     Status: Abnormal   Collection Time: 07/28/23  5:49 PM   Specimen: BLOOD  Result Value Ref Range Status   Specimen Description BLOOD SITE NOT SPECIFIED  Final   Special Requests   Final    BOTTLES DRAWN AEROBIC AND ANAEROBIC Blood Culture adequate volume   Culture  Setup Time   Final    GRAM POSITIVE COCCI IN PAIRS AEROBIC BOTTLE ONLY CRITICAL RESULT CALLED TO, READ BACK BY AND VERIFIED WITH: PHARMD CAREN AMEND ON 07/29/23 @ 1921 BY DRT    Culture (A)  Final    STAPHYLOCOCCUS EPIDERMIDIS THE SIGNIFICANCE OF ISOLATING THIS ORGANISM FROM A SINGLE SET OF BLOOD CULTURES WHEN MULTIPLE SETS ARE DRAWN IS UNCERTAIN. PLEASE NOTIFY THE MICROBIOLOGY DEPARTMENT WITHIN ONE WEEK IF SPECIATION AND SENSITIVITIES ARE REQUIRED. Performed at [facility], [address] [place], [place] [address]    Report Status 07/30/2023 FINAL  Final  Blood Culture (routine x 2)     Status: None   Collection Time: 07/28/23  5:49 PM   Specimen: BLOOD  Result Value Ref Range Status   Specimen Description BLOOD SITE NOT SPECIFIED  Final   Special Requests   Final    BOTTLES DRAWN AEROBIC AND ANAEROBIC Blood Culture adequate volume   Culture   Final    NO GROWTH 5 DAYS Performed at [facility], [address], [place], [place] [address]    Report Status 08/02/2023 FINAL  Final  Blood Culture ID Panel (Reflexed)     Status: Abnormal   Collection Time: 07/28/23  5:49 PM  Result Value Ref Range Status   Enterococcus faecalis NOT DETECTED NOT DETECTED Final   Enterococcus Faecium NOT DETECTED NOT DETECTED Final   Listeria monocytogenes NOT DETECTED NOT DETECTED Final   Staphylococcus species DETECTED (A) NOT DETECTED Final    Comment: CRITICAL RESULT CALLED TO, READ BACK BY AND VERIFIED WITH: PHARMD CAREN AMEND ON 07/29/23 @ 1921 BY DRT    Staphylococcus aureus (BCID) NOT DETECTED NOT  DETECTED Final   Staphylococcus epidermidis DETECTED (A) NOT DETECTED Final    Comment: Methicillin (oxacillin) resistant coagulase negative staphylococcus. Possible blood culture contaminant (unless isolated from more than one blood culture draw or clinical case suggests pathogenicity). No antibiotic treatment is indicated for blood  culture contaminants. CRITICAL RESULT CALLED TO, READ BACK BY AND VERIFIED WITH: PHARMD CAREN AMEND ON 07/29/23 @ 1921 BY DRT    Staphylococcus lugdunensis NOT DETECTED NOT DETECTED Final   Streptococcus species NOT DETECTED NOT DETECTED Final  Streptococcus agalactiae NOT DETECTED NOT DETECTED Final   Streptococcus pneumoniae NOT DETECTED NOT DETECTED Final   Streptococcus pyogenes NOT DETECTED NOT DETECTED Final   A.calcoaceticus-baumannii NOT DETECTED NOT DETECTED Final   Bacteroides fragilis NOT DETECTED NOT DETECTED Final   Enterobacterales NOT DETECTED NOT DETECTED Final   Enterobacter cloacae complex NOT DETECTED NOT DETECTED Final   Escherichia coli NOT DETECTED NOT DETECTED Final   Klebsiella aerogenes NOT DETECTED NOT DETECTED Final   Klebsiella oxytoca NOT DETECTED NOT DETECTED Final   Klebsiella pneumoniae NOT DETECTED NOT DETECTED Final   Proteus species NOT DETECTED NOT DETECTED Final   Salmonella species NOT DETECTED NOT DETECTED Final   Serratia marcescens NOT DETECTED NOT DETECTED Final   Haemophilus influenzae NOT DETECTED NOT DETECTED Final   Neisseria meningitidis NOT DETECTED NOT DETECTED Final   Pseudomonas aeruginosa NOT DETECTED NOT DETECTED Final   Stenotrophomonas maltophilia NOT DETECTED NOT DETECTED Final   Candida albicans NOT DETECTED NOT DETECTED Final   Candida auris NOT DETECTED NOT DETECTED Final   Candida glabrata NOT DETECTED NOT DETECTED Final   Candida krusei NOT DETECTED NOT DETECTED Final   Candida parapsilosis NOT DETECTED NOT DETECTED Final   Candida tropicalis NOT DETECTED NOT DETECTED Final   Cryptococcus  neoformans/gattii NOT DETECTED NOT DETECTED Final   Methicillin resistance mecA/C DETECTED (A) NOT DETECTED Final    Comment: CRITICAL RESULT CALLED TO, READ BACK BY AND VERIFIED WITH: PHARMD CAREN AMEND ON 07/29/23 @ 1921 BY DRT Performed at [facility], [address] [place], [place] [address]   Culture, blood (Routine x 2)     Status: None (Preliminary result)   Collection Time: 08/02/23  9:14 PM   Specimen: BLOOD  Result Value Ref Range Status   Specimen Description BLOOD RIGHT ANTECUBITAL  Final   Special Requests   Final    BOTTLES DRAWN AEROBIC AND ANAEROBIC Blood Culture results may not be optimal due to an inadequate volume of blood received in culture bottles   Culture   Final    NO GROWTH 3 DAYS Performed at [facility], [address] [place], [place] [address]    Report Status PENDING  Incomplete  Culture, blood (Routine x 2)     Status: Abnormal (Preliminary result)   Collection Time: 08/02/23  9:42 PM   Specimen: BLOOD RIGHT HAND  Result Value Ref Range Status   Specimen Description BLOOD RIGHT HAND  Final   Special Requests   Final    BOTTLES DRAWN AEROBIC AND ANAEROBIC Blood Culture results may not be optimal due to an inadequate volume of blood received in culture bottles   Culture  Setup Time   Final    GRAM POSITIVE COCCI IN CLUSTERS IN BOTH AEROBIC AND ANAEROBIC BOTTLES CRITICAL RESULT CALLED TO, READ BACK BY AND VERIFIED WITH: PHARMD ROCKY SLADE [id number] AT 1927 BY [facility] CRITICAL VALUE NOTED.  VALUE IS CONSISTENT WITH PREVIOUSLY REPORTED AND CALLED VALUE.    Culture (A)  Final    STAPHYLOCOCCUS EPIDERMIDIS SUSCEPTIBILITIES TO FOLLOW STAPHYLOCOCCUS HOMINIS THE SIGNIFICANCE OF ISOLATING THIS ORGANISM FROM A SINGLE SET OF BLOOD CULTURES WHEN MULTIPLE SETS ARE DRAWN IS UNCERTAIN. PLEASE NOTIFY THE MICROBIOLOGY DEPARTMENT WITHIN ONE WEEK IF SPECIATION AND SENSITIVITIES ARE REQUIRED. Performed at [facility], [address],  [place], [place] [address]    Report Status PENDING  Incomplete    Procedures/Studies: DG CHEST PORT 1 VIEW Result Date: 08/03/2023 EXAM: 1 VIEW XRAY OF THE CHEST  08/03/2023 04:58:48 AM COMPARISON: 08/02/2023 CLINICAL HISTORY: 200808 Hypoxia [id number]. FINDINGS: LUNGS AND PLEURA: Left basilar airspace disease. Progressive left pleural effusion. HEART AND MEDIASTINUM: The heart is enlarged. BONES AND SOFT TISSUES: No acute osseous abnormality. Changes of COPD are noted. IMPRESSION: 1. Progressive left pleural effusion and left basilar airspace disease. While this may represent atelectasis, infection is considered. 2. Cardiomegaly 3. Changes of COPD. Electronically signed by: Lonni Necessary MD 08/03/2023 05:23 AM EDT RP Workstation: [id number]   DG Chest 1 View Result Date: 08/02/2023 CLINICAL DATA:  Hypoxia EXAM: CHEST  1 VIEW COMPARISON:  Chest x-ray 07/28/2023 FINDINGS: The heart size and mediastinal contours are within normal limits. Both lungs are clear. The visualized skeletal structures are unremarkable. IMPRESSION: No active disease. Electronically Signed   By: Greig Pique M.D.   On: 08/02/2023 22:40   EEG adult Result Date: 07/30/2023 Shelton Arlin KIDD, MD     07/30/2023  9:25 AM Patient Name: Kristine Stafford MRN: [id number] Epilepsy Attending: Arlin KIDD Shelton Referring Physician/Provider: Cindy Garnette POUR, MD Date: 07/29/2023 Duration: 26.02 mins Patient history: 73yo F with ams and seizure like activity. EEG to evaluate for seizure. Level of alertness: Awake AEDs during EEG study: PGB Technical aspects: This EEG study was done with scalp electrodes positioned according to the 10-20 International system of electrode placement. Electrical activity was reviewed with band pass filter of 1-70Hz , sensitivity of 7 uV/mm, display speed of 47mm/sec with a 60Hz  notched filter applied as appropriate. EEG data were recorded continuously and digitally stored.  Video monitoring was available and reviewed as  appropriate. Description: EEG showed continuous generalized 3 to 5 Hz theta-delta slowing. Physiologic photic driving was not seen during photic stimulation.  Hyperventilation was not performed.   ABNORMALITY - Continuous slow, generalized IMPRESSION: This study is suggestive of moderate diffuse encephalopathy. No seizures or epileptiform discharges were seen throughout the recording. Priyanka O Yadav   CT Head Wo Contrast Result Date: 07/28/2023 CLINICAL DATA:  Altered mental status. EXAM: CT HEAD WITHOUT CONTRAST TECHNIQUE: Contiguous axial images were obtained from the base of the skull through the vertex without intravenous contrast. RADIATION DOSE REDUCTION: This exam was performed according to the departmental dose-optimization program which includes automated exposure control, adjustment of the mA and/or kV according to patient size and/or use of iterative reconstruction technique. COMPARISON:  April 27, 2023 FINDINGS: Brain: There is generalized cerebral atrophy with widening of the extra-axial spaces and ventricular dilatation. There are areas of decreased attenuation within the white matter tracts of the supratentorial brain, consistent with microvascular disease changes. Vascular: Marked severity bilateral cavernous carotid artery calcification is noted. Skull: Negative for an acute fracture. Sinuses/Orbits: Moderate severity sphenoid sinus mucosal thickening is seen. Other: None. IMPRESSION: 1. Generalized cerebral atrophy and microvascular disease changes of the supratentorial brain. 2. No acute intracranial abnormality. 3. Moderate severity sphenoid sinus disease. Electronically Signed   By: Suzen Dials M.D.   On: 07/28/2023 22:47   DG Chest Port 1 View Result Date: 07/28/2023 CLINICAL DATA:  Questionable sepsis - evaluate for abnormality , increased fatigue EXAM: PORTABLE CHEST - 1 VIEW COMPARISON:  April 27, 2023 FINDINGS: Lower lung volumes. No focal airspace consolidation, pleural  effusion, or pneumothorax. Streaky atelectasis of the left lung base. The cardiac silhouette is at the upper limits of normal, likely accentuated by AP technique and low lung volumes. Tortuous aorta with aortic atherosclerosis. No acute fracture or destructive lesions. Multilevel thoracic osteophytosis. IMPRESSION: No acute cardiopulmonary abnormality. Electronically Signed   By:  Rogelia Myers M.D.   On: 07/28/2023 17:50     Time coordinating discharge: Over 30 minutes    Alm Apo, MD  [facility] 08/05/2023, 1:53 PM

## 2023-08-05 NOTE — TOC Transition Note (Signed)
 Transition of Care [facility]) - Discharge Note   Patient Details  Name: Kristine Stafford MRN: [id number] Date of Birth: 01/27/1951  [facility] CM/SW Contact:  Luise JAYSON Pan, LCSWA Phone Number: 08/05/2023, 3:51 PM   Clinical Narrative:   Patient will DC to: [facility] Anticipated DC date: 08/05/23  Family notified: Signe (spouse) Transport by: [facility]   Per MD patient ready for DC to [facility]. RN to call report prior to discharge [phone]). RN, patient, patient's family, and facility notified of DC. Discharge Summary and FL2 sent to facility. DC packet on chart. Ambulance transport requested for patient 3:51 PM .   CSW will sign off for now as social work intervention is no longer needed. Please consult us  again if new needs arise.      Final next level of care: Memory Care Barriers to Discharge: Barriers Resolved   Patient Goals and CMS Choice Patient states their goals for this hospitalization and ongoing recovery are:: Return to memory care with Hospice CMS Medicare.gov Compare Post Acute Care list provided to:: Patient Represenative (must comment) Choice offered to / list presented to : [facility] POA / Guardian [place] ownership interest in [facility].provided to:: [facility] POA / Guardian    Discharge Placement              Patient chooses bed at: Other - please specify in the comment section below: ([facility]) Patient to be transferred to facility by: [facility] Name of family member notified: Signe, significant other in room Patient and family notified of of transfer: 08/05/23  Discharge Plan and Services Additional resources added to the After Visit Summary for   In-house Referral: Clinical Social Work   Post Acute Care Choice: Hospice                               Social Drivers of Health (SDOH) Interventions SDOH Screenings   Food Insecurity: Patient Unable To Answer (08/04/2023)  Housing: Patient Unable To Answer  (08/04/2023)  Transportation Needs: Patient Unable To Answer (08/04/2023)  Utilities: Patient Unable To Answer (08/04/2023)  Social Connections: Patient Unable To Answer (08/04/2023)  Tobacco Use: Low Risk  (08/02/2023)     Readmission Risk Interventions     No data to display

## 2023-08-05 NOTE — Progress Notes (Signed)
 Attempted to call report [phone] to [facility]. Was transferred and then there was no answer. Will try again soon

## 2023-08-05 NOTE — TOC Progression Note (Addendum)
 Transition of Care [facility]) - Progression Note    Patient Details  Name: Kristine Stafford MRN: [id number] Date of Birth: 1950/12/14  Transition [facility]) CM/SW Contact  Luise JAYSON Pan, [place] Phone Number: 08/05/2023, 12:25 PM  Clinical Narrative:   CSW left VM for Selinda with [facility] about patients discharge today. Awaiting call back.   Per [facility], Aleck is not available today.   2:30 PM Selinda called CSW back and stated patient can dc today. Awaiting delivery of hospital bed.   TOC will continue to follow.      Expected Discharge Plan: Memory Care Barriers to Discharge: Continued Medical Work up  Expected Discharge Plan and Services In-house Referral: Clinical Social Work   Post Acute Care Choice: Hospice Living arrangements for the past 2 months: Assisted Living Facility                                       Social Determinants of Health (SDOH) Interventions SDOH Screenings   Food Insecurity: Patient Unable To Answer (08/04/2023)  Housing: Patient Unable To Answer (08/04/2023)  Transportation Needs: Patient Unable To Answer (08/04/2023)  Utilities: Patient Unable To Answer (08/04/2023)  Social Connections: Patient Unable To Answer (08/04/2023)  Tobacco Use: Low Risk  (08/02/2023)    Readmission Risk Interventions     No data to display

## 2023-08-06 LAB — CULTURE, BLOOD (ROUTINE X 2)

## 2023-08-07 LAB — CULTURE, BLOOD (ROUTINE X 2): Culture: NO GROWTH

## 2023-09-12 DEATH — deceased
